# Patient Record
Sex: Female | Born: 1955 | Race: White | Hispanic: No | Marital: Married | Smoking: Never smoker
Health system: Southern US, Community
[De-identification: ages and names within clinical notes are randomized; demographics above are authoritative.]

## PROBLEM LIST (undated history)

## (undated) DIAGNOSIS — F32A Depression, unspecified: Secondary | ICD-10-CM

## (undated) DIAGNOSIS — T7840XA Allergy, unspecified, initial encounter: Secondary | ICD-10-CM

## (undated) DIAGNOSIS — J45909 Unspecified asthma, uncomplicated: Secondary | ICD-10-CM

## (undated) DIAGNOSIS — K219 Gastro-esophageal reflux disease without esophagitis: Secondary | ICD-10-CM

## (undated) DIAGNOSIS — H269 Unspecified cataract: Secondary | ICD-10-CM

## (undated) DIAGNOSIS — I219 Acute myocardial infarction, unspecified: Secondary | ICD-10-CM

## (undated) DIAGNOSIS — M1990 Unspecified osteoarthritis, unspecified site: Secondary | ICD-10-CM

## (undated) HISTORY — DX: Unspecified asthma, uncomplicated: J45.909

## (undated) HISTORY — DX: Acute myocardial infarction, unspecified: I21.9

## (undated) HISTORY — DX: Depression, unspecified: F32.A

## (undated) HISTORY — DX: Allergy, unspecified, initial encounter: T78.40XA

## (undated) HISTORY — DX: Unspecified osteoarthritis, unspecified site: M19.90

## (undated) HISTORY — PX: FINGER SURGERY: SHX640

## (undated) HISTORY — PX: EYE SURGERY: SHX253

## (undated) HISTORY — DX: Unspecified cataract: H26.9

## (undated) HISTORY — DX: Gastro-esophageal reflux disease without esophagitis: K21.9

---

## 1990-03-14 HISTORY — PX: HERNIA REPAIR: SHX51

## 1998-11-24 ENCOUNTER — Other Ambulatory Visit: Admission: RE | Admit: 1998-11-24 | Discharge: 1998-11-24 | Payer: Self-pay | Admitting: Obstetrics & Gynecology

## 2000-03-14 DIAGNOSIS — J4520 Mild intermittent asthma, uncomplicated: Secondary | ICD-10-CM | POA: Insufficient documentation

## 2016-05-11 DIAGNOSIS — F419 Anxiety disorder, unspecified: Secondary | ICD-10-CM

## 2016-05-11 HISTORY — DX: Anxiety disorder, unspecified: F41.9

## 2017-02-15 DIAGNOSIS — H903 Sensorineural hearing loss, bilateral: Secondary | ICD-10-CM | POA: Insufficient documentation

## 2018-11-05 DIAGNOSIS — M20012 Mallet finger of left finger(s): Secondary | ICD-10-CM | POA: Insufficient documentation

## 2019-01-28 ENCOUNTER — Other Ambulatory Visit: Payer: Self-pay

## 2019-01-28 DIAGNOSIS — Z20822 Contact with and (suspected) exposure to covid-19: Secondary | ICD-10-CM

## 2019-01-29 LAB — NOVEL CORONAVIRUS, NAA: SARS-CoV-2, NAA: NOT DETECTED

## 2019-01-30 ENCOUNTER — Telehealth: Payer: Self-pay | Admitting: General Practice

## 2019-01-30 NOTE — Telephone Encounter (Signed)
Gave patient negative covid test results Patient understood 

## 2020-02-19 DIAGNOSIS — Z20822 Contact with and (suspected) exposure to covid-19: Secondary | ICD-10-CM | POA: Diagnosis not present

## 2020-03-02 DIAGNOSIS — Z20822 Contact with and (suspected) exposure to covid-19: Secondary | ICD-10-CM | POA: Diagnosis not present

## 2020-03-05 ENCOUNTER — Telehealth: Payer: Self-pay

## 2020-03-05 NOTE — Telephone Encounter (Signed)
Pt calling to inquire about changing provider.  Pt wanting to switch to [place].  Pt currently with Monique Haws, Monique Rangel.  [facility].  Please advise.  CB#[phone]

## 2020-04-03 ENCOUNTER — Encounter: Payer: Self-pay | Admitting: Nurse Practitioner

## 2020-04-03 ENCOUNTER — Other Ambulatory Visit: Payer: Self-pay | Admitting: Nurse Practitioner

## 2020-04-06 ENCOUNTER — Encounter: Payer: Self-pay | Admitting: Nurse Practitioner

## 2020-04-06 DIAGNOSIS — H524 Presbyopia: Secondary | ICD-10-CM | POA: Diagnosis not present

## 2020-04-06 DIAGNOSIS — Z961 Presence of intraocular lens: Secondary | ICD-10-CM | POA: Diagnosis not present

## 2020-04-06 DIAGNOSIS — H18593 Other hereditary corneal dystrophies, bilateral: Secondary | ICD-10-CM | POA: Diagnosis not present

## 2020-04-06 DIAGNOSIS — H35363 Drusen (degenerative) of macula, bilateral: Secondary | ICD-10-CM | POA: Diagnosis not present

## 2020-04-06 DIAGNOSIS — H04123 Dry eye syndrome of bilateral lacrimal glands: Secondary | ICD-10-CM | POA: Diagnosis not present

## 2020-04-14 ENCOUNTER — Ambulatory Visit (INDEPENDENT_AMBULATORY_CARE_PROVIDER_SITE_OTHER): Payer: PPO | Admitting: Nurse Practitioner

## 2020-04-14 ENCOUNTER — Other Ambulatory Visit: Payer: Self-pay

## 2020-04-14 ENCOUNTER — Encounter: Payer: Self-pay | Admitting: Nurse Practitioner

## 2020-04-14 VITALS — BP 128/80 | HR 76 | Temp 97.4°F | Ht 66.25 in | Wt 178.6 lb

## 2020-04-14 DIAGNOSIS — Z23 Encounter for immunization: Secondary | ICD-10-CM | POA: Diagnosis not present

## 2020-04-14 DIAGNOSIS — Z136 Encounter for screening for cardiovascular disorders: Secondary | ICD-10-CM

## 2020-04-14 DIAGNOSIS — J4520 Mild intermittent asthma, uncomplicated: Secondary | ICD-10-CM

## 2020-04-14 DIAGNOSIS — Z0001 Encounter for general adult medical examination with abnormal findings: Secondary | ICD-10-CM | POA: Diagnosis not present

## 2020-04-14 DIAGNOSIS — G43909 Migraine, unspecified, not intractable, without status migrainosus: Secondary | ICD-10-CM | POA: Insufficient documentation

## 2020-04-14 DIAGNOSIS — Z780 Asymptomatic menopausal state: Secondary | ICD-10-CM | POA: Diagnosis not present

## 2020-04-14 DIAGNOSIS — Z13220 Encounter for screening for lipoid disorders: Secondary | ICD-10-CM | POA: Diagnosis not present

## 2020-04-14 DIAGNOSIS — Z1231 Encounter for screening mammogram for malignant neoplasm of breast: Secondary | ICD-10-CM

## 2020-04-14 DIAGNOSIS — H903 Sensorineural hearing loss, bilateral: Secondary | ICD-10-CM | POA: Diagnosis not present

## 2020-04-14 DIAGNOSIS — Z0000 Encounter for general adult medical examination without abnormal findings: Secondary | ICD-10-CM

## 2020-04-14 DIAGNOSIS — F411 Generalized anxiety disorder: Secondary | ICD-10-CM | POA: Diagnosis not present

## 2020-04-14 DIAGNOSIS — M5030 Other cervical disc degeneration, unspecified cervical region: Secondary | ICD-10-CM | POA: Insufficient documentation

## 2020-04-14 DIAGNOSIS — H3530 Unspecified macular degeneration: Secondary | ICD-10-CM | POA: Insufficient documentation

## 2020-04-14 LAB — CBC WITH DIFFERENTIAL/PLATELET
Basophils Absolute: 0 10*3/uL (ref 0.0–0.1)
Basophils Relative: 0.6 % (ref 0.0–3.0)
Eosinophils Absolute: 0.2 10*3/uL (ref 0.0–0.7)
Eosinophils Relative: 2.7 % (ref 0.0–5.0)
HCT: 40.9 % (ref 36.0–46.0)
Hemoglobin: 13.7 g/dL (ref 12.0–15.0)
Lymphocytes Relative: 45.3 % (ref 12.0–46.0)
Lymphs Abs: 2.7 10*3/uL (ref 0.7–4.0)
MCHC: 33.5 g/dL (ref 30.0–36.0)
MCV: 88.6 fl (ref 78.0–100.0)
Monocytes Absolute: 0.5 10*3/uL (ref 0.1–1.0)
Monocytes Relative: 8.1 % (ref 3.0–12.0)
Neutro Abs: 2.6 10*3/uL (ref 1.4–7.7)
Neutrophils Relative %: 43.3 % (ref 43.0–77.0)
Platelets: 239 10*3/uL (ref 150.0–400.0)
RBC: 4.61 Mil/uL (ref 3.87–5.11)
RDW: 14 % (ref 11.5–15.5)
WBC: 5.9 10*3/uL (ref 4.0–10.5)

## 2020-04-14 LAB — COMPREHENSIVE METABOLIC PANEL
ALT: 12 U/L (ref 0–35)
AST: 15 U/L (ref 0–37)
Albumin: 4.3 g/dL (ref 3.5–5.2)
Alkaline Phosphatase: 67 U/L (ref 39–117)
BUN: 17 mg/dL (ref 6–23)
CO2: 29 mEq/L (ref 19–32)
Calcium: 9.3 mg/dL (ref 8.4–10.5)
Chloride: 104 mEq/L (ref 96–112)
Creatinine, Ser: 0.69 mg/dL (ref 0.40–1.20)
GFR: 91.27 mL/min (ref >60.00)
Glucose, Bld: 106 mg/dL — ABNORMAL HIGH (ref 70–99)
Potassium: 4.3 mEq/L (ref 3.5–5.1)
Sodium: 139 mEq/L (ref 135–145)
Total Bilirubin: 0.9 mg/dL (ref 0.2–1.2)
Total Protein: 6.5 g/dL (ref 6.0–8.3)

## 2020-04-14 LAB — TSH: TSH: 1.69 u[IU]/mL (ref 0.35–4.50)

## 2020-04-14 LAB — LIPID PANEL
Cholesterol: 227 mg/dL — ABNORMAL HIGH (ref 0–200)
HDL: 76.6 mg/dL (ref >39.00)
LDL Cholesterol: 125 mg/dL — ABNORMAL HIGH (ref 0–99)
NonHDL: 150.78
Total CHOL/HDL Ratio: 3
Triglycerides: 127 mg/dL (ref 0.0–149.0)
VLDL: 25.4 mg/dL (ref 0.0–40.0)

## 2020-04-14 MED ORDER — DULOXETINE HCL 20 MG PO CPEP: 20.0000 mg | ORAL_CAPSULE | Freq: Every day | ORAL | 5 refills | Status: DC

## 2020-04-14 MED ORDER — ALBUTEROL SULFATE HFA 108 (90 BASE) MCG/ACT IN AERS: 1.0000 | INHALATION_SPRAY | Freq: Four times a day (QID) | RESPIRATORY_TRACT | 0 refills | Status: DC | PRN

## 2020-04-14 MED ORDER — SHINGRIX 50 MCG/0.5ML IM SUSR: 0.5000 mL | Freq: Once | INTRAMUSCULAR | 0 refills | Status: AC

## 2020-04-14 NOTE — Assessment & Plan Note (Signed)
Chronic, waxing and waning, worse in last 66months due to living with mother and winter season. Previous use of prozac (caused worsening mood) and wellbutrin (no adverse side effect). Wants to try something different. Agreed to psychology referral.  Start cymbalta Entered referral to psychology F/up in 79month

## 2020-04-14 NOTE — Assessment & Plan Note (Signed)
Managed by [facility] eye care Also uses Preservision eye solution

## 2020-04-14 NOTE — Progress Notes (Signed)
Subjective:    Patient ID: Monique Rangel, female    DOB: Feb 26, 1956, 65 y.o.   MRN: [id number]  Patient presents today for CPE and eval of chronic conditions  HPI Mild intermittent asthma without complication Controlled Triggers: environmental allergen and exercise Current use of albuterol, flonase and OTC antihistamine  albuterol refill sent  GAD (generalized anxiety disorder) Chronic, waxing and waning, worse in last 52months due to living with mother and winter season. Previous use of prozac (caused worsening mood) and wellbutrin (no adverse side effect). Wants to try something different. Agreed to psychology referral.  Start cymbalta Entered referral to psychology F/up in 28month   Sensorineural hearing loss (SNHL) of both ears Chronic, worsening per patient Entered referral to audiology  Macular degeneration, bilateral Managed by [facility] Also uses Preservision eye solution  Sexual History (orientation,birth control, marital status, STD):no need for pelvic exam, needs mammogram and bone density.  Depression/Suicide: Depression screen PHQ 2/9 04/14/2020  Decreased Interest 1  Down, Depressed, Hopeless 1  PHQ - 2 Score 2  Altered sleeping 3  Tired, decreased energy 0  Change in appetite 0  Feeling bad or failure about yourself  2  Trouble concentrating 0  Moving slowly or fidgety/restless 0  Suicidal thoughts 0  PHQ-9 Score 7  Difficult doing work/chores Not difficult at all   GAD 7 : Generalized Anxiety Score 04/14/2020  Nervous, Anxious, on Edge 2  Control/stop worrying 1  Worry too much - different things 2  Trouble relaxing 1  Restless 0  Easily annoyed or irritable 1  Afraid - awful might happen 1  Total GAD 7 Score 8  Anxiety Difficulty Not difficult at all   Vision:up to date  Dental:up to date  Immunizations: (TDAP, Hep C screen, Pneumovax, Influenza, zoster)  Health Maintenance  Topic Date Due  . Tetanus Vaccine  Never done  .  Mammogram  Never done  . Pap Smear  07/30/2019  . COVID-19 Vaccine (3 - Booster for Pfizer series) 12/11/2019  . DEXA scan (bone density measurement)  Never done  . Pneumonia vaccines (1 of 2 - PCV13) Never done  . HIV Screening  04/14/2021*  . Colon Cancer Screening  10/18/2026  . Flu Shot  Completed  .  Hepatitis C: One time screening is recommended by Center for Disease Control  (CDC) for  adults born from [birth year] through 1966.   Completed  *Topic was postponed. The date shown is not the original due date.   Diet:regular. Exercise: none Weight:  Wt Readings from Last 3 Encounters:  04/14/20 178 lb 9.6 oz (81 kg)   Fall Risk: Fall Risk  04/14/2020  Falls in the past year? 0  Number falls in past yr: 0  Injury with Fall? 0  Risk for fall due to : No Fall Risks  Follow up Falls evaluation completed   Medications and allergies reviewed with patient and updated if appropriate.  Patient Active Problem List   Diagnosis Date Noted  . GAD (generalized anxiety disorder) 04/14/2020  . Asymptomatic postmenopausal estrogen deficiency 04/14/2020  . DDD (degenerative disc disease), cervical 04/14/2020  . Migraine 04/14/2020  . Macular degeneration, bilateral 04/14/2020  . 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 Sensorineural hearing loss (SNHL) of both ears 02/15/2017  . Mild intermittent asthma without complication [id number]    Current Outpatient Medications on File Prior to Visit  Medication Sig Dispense Refill  . cyclobenzaprine (FLEXERIL) 5 MG tablet Take 5 mg  by mouth 3 (three) times daily as needed for muscle spasms.    . diphenhydrAMINE (BENADRYL) 25 MG tablet Take 25 mg by mouth at bedtime as needed.    . fluticasone (FLONASE) 50 MCG/ACT nasal spray Place 2 sprays into both nostrils every morning.    Marland Kitchen glucosamine-chondroitin 500-400 MG tablet Take 1 tablet by mouth 3 (three) times daily.     No current facility-administered medications on file prior to  visit.   Past Medical History:  Diagnosis Date  . Anxiety 05/11/2016   Social History   Socioeconomic History  . Marital status: Married    Spouse name: Not on file  . Number of children: Not on file  . Years of education: Not on file  . Highest education level: Not on file  Occupational History  . Not on file  Tobacco Use  . Smoking status: Not on file  . Smokeless tobacco: Not on file  Substance and Sexual Activity  . Alcohol use: Yes    Comment: Moderate  . Drug use: Never  . Sexual activity: Not Currently  Other Topics Concern  . Not on file  Social History Narrative  . Not on file   Social Determinants of Health   Financial Resource Strain: Not on file  Food Insecurity: Not on file  Transportation Needs: Not on file  Physical Activity: Not on file  Stress: Not on file  Social Connections: Not on file    No family history on file.      Review of Systems  Constitutional: Negative for fever, malaise/fatigue and weight loss.  HENT: Negative for congestion and sore throat.   Eyes:       Negative for visual changes  Respiratory: Negative for cough and shortness of breath.   Cardiovascular: Negative for chest pain, palpitations and leg swelling.  Gastrointestinal: Negative for blood in stool, constipation, diarrhea and heartburn.  Genitourinary: Negative for dysuria, frequency and urgency.  Musculoskeletal: Negative for falls, joint pain and myalgias.  Skin: Negative for rash.  Neurological: Negative for dizziness, sensory change and headaches.  Endo/Heme/Allergies: Does not bruise/bleed easily.  Psychiatric/Behavioral: Positive for depression. Negative for hallucinations, memory loss, substance abuse and suicidal ideas. The patient is nervous/anxious and has insomnia.    Objective:   Vitals:   04/14/20 0925  BP: 128/80  Pulse: 76  Temp: (!) 97.4 F (36.3 C)  SpO2: 98%    Body mass index is 28.61 kg/m.   Physical Examination:  Physical  Exam Vitals reviewed.  Constitutional:      General: She is not in acute distress. HENT:     Right Ear: Tympanic membrane, ear canal and external ear normal.     Left Ear: Ear canal and external ear normal.     Mouth/Throat:     Mouth: Oropharynx is clear and moist.  Eyes:     General: No scleral icterus.    Extraocular Movements: Extraocular movements intact and EOM normal.     Conjunctiva/sclera: Conjunctivae normal.  Neck:     Thyroid: No thyromegaly.  Cardiovascular:     Rate and Rhythm: Normal rate and regular rhythm.     Pulses: Normal pulses and intact distal pulses.     Heart sounds: Normal heart sounds.  Pulmonary:     Effort: Pulmonary effort is normal.     Breath sounds: Normal breath sounds.  Chest:     Chest wall: No tenderness.  Abdominal:     General: Bowel sounds are normal. There is no  distension.     Palpations: Abdomen is soft.     Tenderness: There is no abdominal tenderness.  Genitourinary:    Comments: Declined breast and pelvic exam Musculoskeletal:        General: No tenderness or edema. Normal range of motion.     Cervical back: Normal range of motion and neck supple.  Lymphadenopathy:     Cervical: No cervical adenopathy.  Skin:    General: Skin is warm and dry.  Neurological:     Mental Status: She is alert and oriented to person, place, and time.  Psychiatric:        Judgment: Judgment normal.    ASSESSMENT and PLAN: This visit occurred during the SARS-CoV-2 public health emergency.  Safety protocols were in place, including screening questions prior to the visit, additional usage of staff PPE, and extensive cleaning of exam room while observing appropriate contact time as indicated for disinfecting solutions.   Monique Rangel was seen today for establish care.  Diagnoses and all orders for this visit:  Well woman exam without gynecological exam -     Zoster Vaccine Adjuvanted [facility]) injection; Inject 0.5 mLs into the muscle once for 1 dose. -      CBC with Differential/Platelet -     Comprehensive metabolic panel -     Lipid panel -     DG Bone Density; Future -     MM DIGITAL SCREENING BILATERAL; Future  Encounter for lipid screening for cardiovascular disease -     Lipid panel  Breast cancer screening by mammogram -     MM DIGITAL SCREENING BILATERAL; Future  Sensorineural hearing loss (SNHL) of both ears -     Ambulatory referral to Audiology  GAD (generalized anxiety disorder) -     TSH -     DULoxetine (CYMBALTA) 20 MG capsule; Take 1 capsule (20 mg total) by mouth daily. -     Ambulatory referral to Psychology  Need for shingles vaccine -     Zoster Vaccine Adjuvanted [facility]) injection; Inject 0.5 mLs into the muscle once for 1 dose.  Asymptomatic postmenopausal estrogen deficiency -     Vitamin D 1,25 dihydroxy -     DG Bone Density; Future  Mild intermittent asthma without complication -     albuterol (VENTOLIN HFA) 108 (90 Base) MCG/ACT inhaler; Inhale 1-2 puffs into the lungs every 6 (six) hours as needed for wheezing or shortness of breath.      Problem List Items Addressed This Visit      Respiratory   Mild intermittent asthma without complication    Controlled Triggers: environmental allergen and exercise Current use of albuterol, flonase and OTC antihistamine  albuterol refill sent      Relevant Medications   albuterol (VENTOLIN HFA) 108 (90 Base) MCG/ACT inhaler     Nervous and Auditory   Sensorineural hearing loss (SNHL) of both ears    Chronic, worsening per patient Entered referral to audiology      Relevant Orders   Ambulatory referral to Audiology     Other   Asymptomatic postmenopausal estrogen deficiency   Relevant Orders   Vitamin D 1,25 dihydroxy   DG Bone Density   GAD (generalized anxiety disorder)    Chronic, waxing and waning, worse in last 79months due to living with mother and winter season. Previous use of prozac (caused worsening mood) and wellbutrin (no adverse  side effect). Wants to try something different. Agreed to psychology referral.  Start cymbalta Entered referral  to psychology F/up in 60month       Relevant Medications   DULoxetine (CYMBALTA) 20 MG capsule   Other Relevant Orders   TSH   Ambulatory referral to Psychology    Other Visit Diagnoses    Well woman exam without gynecological exam    -  Primary   Relevant Medications   Zoster Vaccine Adjuvanted [facility]) injection   Other Relevant Orders   CBC with Differential/Platelet   Comprehensive metabolic panel   Lipid panel   DG Bone Density   MM DIGITAL SCREENING BILATERAL   Encounter for lipid screening for cardiovascular disease       Relevant Orders   Lipid panel   Breast cancer screening by mammogram       Relevant Orders   MM DIGITAL SCREENING BILATERAL   Need for shingles vaccine       Relevant Medications   Zoster Vaccine Adjuvanted [facility]) injection      Follow up: Return in about 4 weeks (around 05/12/2020) for anxiety (95mins).  Wilfred Lacy, NP

## 2020-04-14 NOTE — Patient Instructions (Addendum)
Thank you for choosing [facility].  Go to lab for blood draw You will be contacted to schedule appt for bone density, mammogram, with psychologist and audiology.  Start cymbalta for anxiety.  Preventive Care 66 Years and Older, Female Preventive care refers to lifestyle choices and visits with your health care provider that can promote health and wellness. This includes:  A yearly physical exam. This is also called an annual wellness visit.  Regular dental and eye exams.  Immunizations.  Screening for certain conditions.  Healthy lifestyle choices, such as: ? Eating a healthy diet. ? Getting regular exercise. ? Not using drugs or products that contain nicotine and tobacco. ? Limiting alcohol use. What can I expect for my preventive care visit? Physical exam Your health care provider will check your:  Height and weight. These may be used to calculate your BMI (body mass index). BMI is a measurement that tells if you are at a healthy weight.  Heart rate and blood pressure.  Body temperature.  Skin for abnormal spots. Counseling Your health care provider may ask you questions about your:  Past medical problems.  Family's medical history.  Alcohol, tobacco, and drug use.  Emotional well-being.  Home life and relationship well-being.  Sexual activity.  Diet, exercise, and sleep habits.  History of falls.  Memory and ability to understand (cognition).  Work and work Statistician.  Pregnancy and menstrual history.  Access to firearms. What immunizations do I need? Vaccines are usually given at various ages, according to a schedule. Your health care provider will recommend vaccines for you based on your age, medical history, and lifestyle or other factors, such as travel or where you work.   What tests do I need? Blood tests  Lipid and cholesterol levels. These may be checked every 5 years, or more often depending on your overall health.  Hepatitis  C test.  Hepatitis B test. Screening  Lung cancer screening. You may have this screening every year starting at age 66 if you have a 30-pack-year history of smoking and currently smoke or have quit within the past 15 years.  Colorectal cancer screening. ? All adults should have this screening starting at age 66 and continuing until age 58. ? Your health care provider may recommend screening at age 65 if you are at increased risk. ? You will have tests every 1-10 years, depending on your results and the type of screening test.  Diabetes screening. ? This is done by checking your blood sugar (glucose) after you have not eaten for a while (fasting). ? You may have this done every 1-3 years.  Mammogram. ? This may be done every 1-2 years. ? Talk with your health care provider about how often you should have regular mammograms.  Abdominal aortic aneurysm (AAA) screening. You may need this if you are a current or former smoker.  BRCA-related cancer screening. This may be done if you have a family history of breast, ovarian, tubal, or peritoneal cancers. Other tests  STD (sexually transmitted disease) testing, if you are at risk.  Bone density scan. This is done to screen for osteoporosis. You may have this done starting at age 66. Talk with your health care provider about your test results, treatment options, and if necessary, the need for more tests. Follow these instructions at home: Eating and drinking  Eat a diet that includes fresh fruits and vegetables, whole grains, lean protein, and low-fat dairy products. Limit your intake of foods with high amounts  of sugar, saturated fats, and salt.  Take vitamin and mineral supplements as recommended by your health care provider.  Do not drink alcohol if your health care provider tells you not to drink.  If you drink alcohol: ? Limit how much you have to 0-1 drink a day. ? Be aware of how much alcohol is in your drink. In the [place] one  drink equals one 12 oz bottle of beer (355 mL), one 5 oz glass of wine (148 mL), or one 1 oz glass of hard liquor (44 mL).   Lifestyle  Take daily care of your teeth and gums. Brush your teeth every morning and night with fluoride toothpaste. Floss one time each day.  Stay active. Exercise for at least 30 minutes 5 or more days each week.  Do not use any products that contain nicotine or tobacco, such as cigarettes, e-cigarettes, and chewing tobacco. If you need help quitting, ask your health care provider.  Do not use drugs.  If you are sexually active, practice safe sex. Use a condom or other form of protection in order to prevent STIs (sexually transmitted infections).  Talk with your health care provider about taking a low-dose aspirin or statin.  Find healthy ways to cope with stress, such as: ? Meditation, yoga, or listening to music. ? Journaling. ? Talking to a trusted person. ? Spending time with friends and family. Safety  Always wear your seat belt while driving or riding in a vehicle.  Do not drive: ? If you have been drinking alcohol. Do not ride with someone who has been drinking. ? When you are tired or distracted. ? While texting.  Wear a helmet and other protective equipment during sports activities.  If you have firearms in your house, make sure you follow all gun safety procedures. What's next?  Visit your health care provider once a year for an annual wellness visit.  Ask your health care provider how often you should have your eyes and teeth checked.  Stay up to date on all vaccines. This information is not intended to replace advice given to you by your health care provider. Make sure you discuss any questions you have with your health care provider. Document Revised: 02/19/2020 Document Reviewed: 02/22/2018 Elsevier Patient Education  2021 [employer].

## 2020-04-14 NOTE — Assessment & Plan Note (Signed)
Chronic, worsening per patient Entered referral to audiology

## 2020-04-14 NOTE — Assessment & Plan Note (Signed)
Controlled Triggers: environmental allergen and exercise Current use of albuterol, flonase and OTC antihistamine  albuterol refill sent

## 2020-04-17 LAB — VITAMIN D 1,25 DIHYDROXY
Vitamin D 1, 25 (OH)2 Total: 55 pg/mL (ref 18–72)
Vitamin D2 1, 25 (OH)2: <8 pg/mL
Vitamin D3 1, 25 (OH)2: 55 pg/mL

## 2020-05-04 ENCOUNTER — Ambulatory Visit (INDEPENDENT_AMBULATORY_CARE_PROVIDER_SITE_OTHER): Payer: PPO | Admitting: Psychology

## 2020-05-04 DIAGNOSIS — F411 Generalized anxiety disorder: Secondary | ICD-10-CM | POA: Diagnosis not present

## 2020-05-11 ENCOUNTER — Other Ambulatory Visit: Payer: Self-pay

## 2020-05-12 ENCOUNTER — Ambulatory Visit: Payer: PPO

## 2020-05-12 ENCOUNTER — Ambulatory Visit (INDEPENDENT_AMBULATORY_CARE_PROVIDER_SITE_OTHER): Payer: PPO | Admitting: Nurse Practitioner

## 2020-05-12 ENCOUNTER — Encounter: Payer: Self-pay | Admitting: Nurse Practitioner

## 2020-05-12 VITALS — BP 122/80 | HR 74 | Temp 98.0°F | Ht 66.0 in | Wt 177.6 lb

## 2020-05-12 DIAGNOSIS — M25561 Pain in right knee: Secondary | ICD-10-CM

## 2020-05-12 DIAGNOSIS — M5030 Other cervical disc degeneration, unspecified cervical region: Secondary | ICD-10-CM

## 2020-05-12 DIAGNOSIS — M25562 Pain in left knee: Secondary | ICD-10-CM

## 2020-05-12 DIAGNOSIS — B351 Tinea unguium: Secondary | ICD-10-CM | POA: Diagnosis not present

## 2020-05-12 DIAGNOSIS — G8929 Other chronic pain: Secondary | ICD-10-CM

## 2020-05-12 MED ORDER — TERBINAFINE HCL 250 MG PO TABS
250.0000 mg | ORAL_TABLET | Freq: Every day | ORAL | 0 refills | Status: DC
Start: 2020-05-12 — End: 2020-05-19

## 2020-05-12 NOTE — Patient Instructions (Signed)
Return to lab for repeat hepatic panel in 31month.  Schedule lab appt. Soak feet in water and vinegar mixture daily, 51mins each time  Go to lab for knee x-rays You will be contacted to schedule appt with ortho

## 2020-05-12 NOTE — Progress Notes (Signed)
Subjective:  Patient ID: Monique Rangel, female    DOB: 03-01-1956  Age: 65 y.o. MRN: [id number]  CC: Follow-up (4 week f/u on anxiety and neck spasms. Pt states she feels like the Cymbalta is helping a lot. Pt states she has had the neck spasms for years and has seen a doctor in the past but still no improvement. )  HPI  Onychomycosis bilateral toenails: thick, brittle, yellow. no sign of cellulitis. Start lamisil Return to lab in 55month for repeat hepatic panel Advised to avoid ETOH and acetaminophen. She is stop medication and call office if she develops ABD pain, nausea or jaundice.  Chronic pain of both knees Chronic, worsening in last 51months with playing tennis and climbing stairs. Associated with swelling and stiffness. Denies previous surgery of injury. Reports Hx of OA. Intra articular corticosteriod injection administered x 2 65yrs ago with no improvement. She is not interested in any surgery at this time. Some improvement with voltaren gel and knee braces Obtain knee x-ray Entered referral to ortho to discuss viscosupplementation treatment.  DDD (degenerative disc disease), cervical Chronic, right side intermittent neck pain, ongoing since 2016 Limited ROM to left No weakness, no paresthesia. Some improvement with outpatient PT. Reviewed records via care everywhere:  cervical spine x-ray report 2016: Diffuse degenerative changes cervical spine degenerative changes most prominent at C5-C6. Mild right C4-C5 neural foraminal narrowing secondary degenerative change present. Mild loss of normal cervical lordosis. This could be secondary to positioning, torticollis, degenerative change, ligamentous injury. No evidence of fracture or dislocation.   Referral to ortho    Reviewed past Medical, Social and Family history today.  Outpatient Medications Prior to Visit  Medication Sig Dispense Refill  . albuterol (VENTOLIN HFA) 108 (90 Base) MCG/ACT inhaler Inhale 1-2 puffs  into the lungs every 6 (six) hours as needed for wheezing or shortness of breath. 8 g 0  . cyclobenzaprine (FLEXERIL) 5 MG tablet Take 5 mg by mouth 3 (three) times daily as needed for muscle spasms.    . diphenhydrAMINE (BENADRYL) 25 MG tablet Take 25 mg by mouth at bedtime as needed.    . DULoxetine (CYMBALTA) 20 MG capsule Take 1 capsule (20 mg total) by mouth daily. 30 capsule 5  . fluticasone (FLONASE) 50 MCG/ACT nasal spray Place 2 sprays into both nostrils every morning.    Marland Kitchen glucosamine-chondroitin 500-400 MG tablet Take 1 tablet by mouth 3 (three) times daily.     No facility-administered medications prior to visit.    ROS See HPI  Objective:  BP 122/80 (BP Location: Left Arm, Patient Position: Sitting, Cuff Size: Large)   Pulse 74   Temp 98 F (36.7 C) (Temporal)   Ht 5\' 6"  (1.676 m)   Wt 177 lb 9.6 oz (80.6 kg)   SpO2 98%   BMI 28.67 kg/m   Physical Exam Cardiovascular:     Pulses: Normal pulses.  Pulmonary:     Effort: Pulmonary effort is normal.  Musculoskeletal:     Right shoulder: Normal.     Left shoulder: Normal.     Right upper arm: Normal.     Left upper arm: Normal.     Right elbow: Normal.     Left elbow: Normal.     Right forearm: Normal.     Left forearm: Normal.     Right wrist: Normal.     Left wrist: Normal.     Right hand: Normal.     Left hand: Normal.  Cervical back: Tenderness present. No torticollis or crepitus. Pain with movement present. Decreased range of motion.     Thoracic back: Normal.     Right knee: Effusion present. No erythema. Normal range of motion. Tenderness present over the lateral joint line. No medial joint line or patellar tendon tenderness.     Left knee: Effusion present. No erythema. Normal range of motion. No tenderness.     Right lower leg: Normal. No edema.     Left lower leg: Normal. No edema.  Feet:     Right foot:     Skin integrity: No erythema.     Toenail Condition: Right toenails are abnormally thick.  Fungal disease present.    Left foot:     Skin integrity: No erythema.     Toenail Condition: Left toenails are abnormally thick. Fungal disease present. Neurological:     Mental Status: She is oriented to person, place, and time.    Assessment & Plan:  This visit occurred during the SARS-CoV-2 public health emergency.  Safety protocols were in place, including screening questions prior to the visit, additional usage of staff PPE, and extensive cleaning of exam room while observing appropriate contact time as indicated for disinfecting solutions.   [name] was seen today for follow-up.  Diagnoses and all orders for this visit:  Onychomycosis -     terbinafine (LAMISIL) 250 MG tablet; Take 1 tablet (250 mg total) by mouth daily. Needs repeat hepatic panel for additional refills -     Hepatic function panel; Future -     Hepatic function panel -     DG Knee Complete 4 Views Left; Future -     DG Knee Complete 4 Views Right; Future  DDD (degenerative disc disease), cervical -     Ambulatory referral to Orthopedic Surgery -     DG Knee Complete 4 Views Left; Future -     DG Knee Complete 4 Views Right; Future  Chronic pain of both knees -     Ambulatory referral to Orthopedic Surgery -     Cancel: DG Knee 4 Views W/Patella Right -     Cancel: DG Knee 4 Views W/Patella Left -     DG Knee Complete 4 Views Left; Future -     DG Knee Complete 4 Views Right; Future    Problem List Items Addressed This Visit      Musculoskeletal and Integument   DDD (degenerative disc disease), cervical    Chronic, right side intermittent neck pain, ongoing since 2016 Limited ROM to left No weakness, no paresthesia. Some improvement with outpatient PT. Reviewed records via care everywhere:  cervical spine x-ray report 2016: Diffuse degenerative changes cervical spine degenerative changes most prominent at C5-C6. Mild right C4-C5 neural foraminal narrowing secondary degenerative change present. Mild  loss of normal cervical lordosis. This could be secondary to positioning, torticollis, degenerative change, ligamentous injury. No evidence of fracture or dislocation.   Referral to ortho        Relevant Orders   Ambulatory referral to Orthopedic Surgery   DG Knee Complete 4 Views Left   DG Knee Complete 4 Views Right   Onychomycosis - Primary    bilateral toenails: thick, brittle, yellow. no sign of cellulitis. Start lamisil Return to lab in 23month for repeat hepatic panel Advised to avoid ETOH and acetaminophen. She is stop medication and call office if she develops ABD pain, nausea or jaundice.      Relevant Medications  terbinafine (LAMISIL) 250 MG tablet   Other Relevant Orders   Hepatic function panel   DG Knee Complete 4 Views Left   DG Knee Complete 4 Views Right     Other   Chronic pain of both knees    Chronic, worsening in last 36months with playing tennis and climbing stairs. Associated with swelling and stiffness. Denies previous surgery of injury. Reports Hx of OA. Intra articular corticosteriod injection administered x 2 38yrs ago with no improvement. She is not interested in any surgery at this time. Some improvement with voltaren gel and knee braces Obtain knee x-ray Entered referral to ortho to discuss viscosupplementation treatment.      Relevant Orders   Ambulatory referral to Orthopedic Surgery   DG Knee Complete 4 Views Left   DG Knee Complete 4 Views Right      Follow-up: Return if symptoms worsen or fail to improve.  Wilfred Lacy, NP

## 2020-05-12 NOTE — Assessment & Plan Note (Addendum)
bilateral toenails: thick, brittle, yellow. no sign of cellulitis. Start lamisil Return to lab in 62month for repeat hepatic panel Advised to avoid ETOH and acetaminophen. She is stop medication and call office if she develops ABD pain, nausea or jaundice.

## 2020-05-12 NOTE — Assessment & Plan Note (Signed)
Chronic, worsening in last 7months with playing tennis and climbing stairs. Associated with swelling and stiffness. Denies previous surgery of injury. Reports Hx of OA. Intra articular corticosteriod injection administered x 2 37yrs ago with no improvement. She is not interested in any surgery at this time. Some improvement with voltaren gel and knee braces Obtain knee x-ray Entered referral to ortho to discuss viscosupplementation treatment.

## 2020-05-12 NOTE — Assessment & Plan Note (Signed)
Chronic, right side intermittent neck pain, ongoing since 2016 Limited ROM to left No weakness, no paresthesia. Some improvement with outpatient PT. Reviewed records via care everywhere:  cervical spine x-ray report 2016: Diffuse degenerative changes cervical spine degenerative changes most prominent at C5-C6. Mild right C4-C5 neural foraminal narrowing secondary degenerative change present. Mild loss of normal cervical lordosis. This could be secondary to positioning, torticollis, degenerative change, ligamentous injury. No evidence of fracture or dislocation.   Referral to ortho

## 2020-05-13 ENCOUNTER — Other Ambulatory Visit: Payer: Self-pay

## 2020-05-13 ENCOUNTER — Ambulatory Visit (INDEPENDENT_AMBULATORY_CARE_PROVIDER_SITE_OTHER): Payer: PPO

## 2020-05-13 DIAGNOSIS — M1712 Unilateral primary osteoarthritis, left knee: Secondary | ICD-10-CM | POA: Diagnosis not present

## 2020-05-13 DIAGNOSIS — M25562 Pain in left knee: Secondary | ICD-10-CM

## 2020-05-13 DIAGNOSIS — G8929 Other chronic pain: Secondary | ICD-10-CM

## 2020-05-13 DIAGNOSIS — M25561 Pain in right knee: Secondary | ICD-10-CM

## 2020-05-13 DIAGNOSIS — M1711 Unilateral primary osteoarthritis, right knee: Secondary | ICD-10-CM | POA: Diagnosis not present

## 2020-05-13 LAB — HEPATIC FUNCTION PANEL
ALT: 12 U/L (ref 0–35)
AST: 18 U/L (ref 0–37)
Albumin: 4.4 g/dL (ref 3.5–5.2)
Alkaline Phosphatase: 72 U/L (ref 39–117)
Bilirubin, Direct: 0.2 mg/dL (ref 0.0–0.3)
Total Bilirubin: 1 mg/dL (ref 0.2–1.2)
Total Protein: 6.8 g/dL (ref 6.0–8.3)

## 2020-05-13 NOTE — Addendum Note (Signed)
Addended by: Lynnea Ferrier on: 05/13/2020 01:23 PM   Modules accepted: Orders

## 2020-05-14 NOTE — Addendum Note (Signed)
Addended by: Wilfred Lacy L on: 05/14/2020 11:51 AM   Modules accepted: Orders

## 2020-05-18 ENCOUNTER — Ambulatory Visit (INDEPENDENT_AMBULATORY_CARE_PROVIDER_SITE_OTHER): Payer: PPO | Admitting: Psychology

## 2020-05-18 ENCOUNTER — Telehealth: Payer: Self-pay

## 2020-05-18 DIAGNOSIS — F411 Generalized anxiety disorder: Secondary | ICD-10-CM | POA: Diagnosis not present

## 2020-05-18 NOTE — Telephone Encounter (Signed)
Stop medication and start benadryl 25mg  every 12hrs.

## 2020-05-18 NOTE — Telephone Encounter (Signed)
Pt is having side effects to lamisil that was prescribed last week. She has developed a rash on her neck and arms and has a slight headache. She stopped taking the medication yesterday.She wants to know if something topical can be prescribed.  Thank you

## 2020-05-18 NOTE — Telephone Encounter (Signed)
No new medication at this time till rash resolves

## 2020-05-18 NOTE — Telephone Encounter (Signed)
Pt would like to know if a topical ointment for her toenail fungus?

## 2020-05-19 ENCOUNTER — Ambulatory Visit (INDEPENDENT_AMBULATORY_CARE_PROVIDER_SITE_OTHER): Payer: PPO | Admitting: Family Medicine

## 2020-05-19 ENCOUNTER — Other Ambulatory Visit: Payer: Self-pay

## 2020-05-19 ENCOUNTER — Ambulatory Visit (INDEPENDENT_AMBULATORY_CARE_PROVIDER_SITE_OTHER): Payer: PPO

## 2020-05-19 ENCOUNTER — Encounter: Payer: Self-pay | Admitting: Family Medicine

## 2020-05-19 ENCOUNTER — Telehealth: Payer: Self-pay | Admitting: Family Medicine

## 2020-05-19 DIAGNOSIS — M542 Cervicalgia: Secondary | ICD-10-CM

## 2020-05-19 DIAGNOSIS — G8929 Other chronic pain: Secondary | ICD-10-CM | POA: Diagnosis not present

## 2020-05-19 DIAGNOSIS — M25561 Pain in right knee: Secondary | ICD-10-CM

## 2020-05-19 DIAGNOSIS — M25562 Pain in left knee: Secondary | ICD-10-CM | POA: Diagnosis not present

## 2020-05-19 NOTE — Patient Instructions (Signed)
   Turmeric:  500 mg twice daily    [facility]:   [phone]
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
## 2020-05-19 NOTE — Telephone Encounter (Signed)
Noted  

## 2020-05-19 NOTE — Telephone Encounter (Signed)
Requesting approval for bilateral knee gel injections for OA.

## 2020-05-19 NOTE — Progress Notes (Signed)
I saw and examined the patient with Dr. Elouise Munroe and agree with assessment and plan as outlined.    Chronic intermittent neck pain with brief spasms.  X-rays today show moderate C5-6 degenerative disc disease with diffuse facet arthropathy.  We will try physical therapy, possible traction.  Home exercises for maintenance.  Bilateral knee pain with osteoarthritis.  Pain is minimal at the moment, she does well with glucosamine and Voltaren gel.  We will request approval for gel injections for future use.  Referral to physical therapy as well for strengthening.  She plans to play tennis this year and would like to do some injury prevention if possible.

## 2020-05-21 ENCOUNTER — Telehealth: Payer: Self-pay

## 2020-05-21 MED ORDER — CYCLOBENZAPRINE HCL 5 MG PO TABS: 5.0000 mg | ORAL_TABLET | Freq: Three times a day (TID) | ORAL | 1 refills | Status: DC | PRN

## 2020-05-21 NOTE — Telephone Encounter (Signed)
Please advise 

## 2020-05-21 NOTE — Addendum Note (Signed)
Addended by: Hortencia Pilar on: 05/21/2020 11:55 AM   Modules accepted: Orders

## 2020-05-21 NOTE — Telephone Encounter (Signed)
Patient called she is requesting a rx to be sent into the pharmacy for flexeril call [phone]

## 2020-05-21 NOTE — Telephone Encounter (Signed)
Sent!

## 2020-05-21 NOTE — Telephone Encounter (Signed)
Submitted VOB for Monovisc, bilateral knee.  Pending BV. 

## 2020-05-21 NOTE — Telephone Encounter (Signed)
I tried calling the patient to advise that the medication has been sent in to the pharmacy -- reached her voice mail, which has not been set up yet. The pharmacy should alert her, though, when the Rx is ready for pickup.

## 2020-05-22 NOTE — Telephone Encounter (Signed)
Patient notified and verbalized understanding. 

## 2020-05-26 ENCOUNTER — Ambulatory Visit: Payer: PPO | Admitting: Nurse Practitioner

## 2020-05-26 ENCOUNTER — Ambulatory Visit (INDEPENDENT_AMBULATORY_CARE_PROVIDER_SITE_OTHER): Payer: PPO | Admitting: Psychology

## 2020-05-26 DIAGNOSIS — F411 Generalized anxiety disorder: Secondary | ICD-10-CM | POA: Diagnosis not present

## 2020-06-03 ENCOUNTER — Ambulatory Visit
Admission: RE | Admit: 2020-06-03 | Discharge: 2020-06-03 | Disposition: A | Discharge status: Home / Self Care | Payer: PPO | Source: Ambulatory Visit | Attending: Nurse Practitioner | Admitting: Nurse Practitioner

## 2020-06-03 ENCOUNTER — Encounter: Payer: Self-pay | Admitting: Physical Therapy

## 2020-06-03 ENCOUNTER — Ambulatory Visit: Payer: PPO | Attending: Family Medicine | Admitting: Physical Therapy

## 2020-06-03 ENCOUNTER — Other Ambulatory Visit: Payer: Self-pay

## 2020-06-03 DIAGNOSIS — H903 Sensorineural hearing loss, bilateral: Secondary | ICD-10-CM | POA: Insufficient documentation

## 2020-06-03 DIAGNOSIS — Z1231 Encounter for screening mammogram for malignant neoplasm of breast: Secondary | ICD-10-CM | POA: Diagnosis not present

## 2020-06-03 DIAGNOSIS — G8929 Other chronic pain: Secondary | ICD-10-CM | POA: Insufficient documentation

## 2020-06-03 DIAGNOSIS — Z0000 Encounter for general adult medical examination without abnormal findings: Secondary | ICD-10-CM

## 2020-06-03 DIAGNOSIS — M25562 Pain in left knee: Secondary | ICD-10-CM | POA: Insufficient documentation

## 2020-06-03 DIAGNOSIS — M542 Cervicalgia: Secondary | ICD-10-CM | POA: Diagnosis not present

## 2020-06-03 DIAGNOSIS — M25561 Pain in right knee: Secondary | ICD-10-CM | POA: Diagnosis not present

## 2020-06-03 DIAGNOSIS — M62838 Other muscle spasm: Secondary | ICD-10-CM | POA: Insufficient documentation
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
## 2020-06-08 ENCOUNTER — Ambulatory Visit: Payer: PPO | Admitting: Audiologist

## 2020-06-08 ENCOUNTER — Other Ambulatory Visit: Payer: Self-pay | Admitting: Nurse Practitioner

## 2020-06-08 ENCOUNTER — Other Ambulatory Visit: Payer: Self-pay

## 2020-06-08 DIAGNOSIS — R928 Other abnormal and inconclusive findings on diagnostic imaging of breast: Secondary | ICD-10-CM

## 2020-06-08 DIAGNOSIS — M25561 Pain in right knee: Secondary | ICD-10-CM | POA: Diagnosis not present

## 2020-06-08 DIAGNOSIS — H903 Sensorineural hearing loss, bilateral: Secondary | ICD-10-CM

## 2020-06-08 NOTE — Procedures (Signed)
  Outpatient Audiology and [place], [place]  [address] [phone]  AUDIOLOGICAL  EVALUATION  NAME: Monique Rangel     DOB:   01/19/1956      MRN: [id number]                                                                                     DATE: 06/08/2020     REFERENT: Flossie Buffy, NP STATUS: Outpatient DIAGNOSIS: Sensorineural hearing loss (SNHL) of both ears    History: Monique Rangel was seen for an audiological evaluation.  Monique Rangel is receiving a hearing evaluation due to concerns for continued progression of her hearing loss. Monique Rangel has difficulty hearing in background noise, crowds, and when people are at a distance. This difficulty began gradually. No pain or pressure reported in either ear. Tinnitus present in occasionally in both ears. Monique Rangel has a history of ear infections with a severe infection at age 75. Her last ear infection was back in college. Monique Rangel has family history of hearing loss in the women in her family. Her mother wears hearing aids and her sister had surgery on her ears when she was 43. Monique Rangel said she thinks it was a cochlear implant, but says her sister does not have an external device. It is unclear what her sister had done. Monique Rangel brought two audiograms, one from 2014 and one from 2018 with Monique Rangel at [facility]. Previous audiogram show sensorineural mild to moderate reverse cookie bite hearing loss. Monique Rangel has previously been evaluated by Monique Rangel. No other relevant case history reported.    Evaluation:   Otoscopy showed a partial view of the tympanic membranes with nonoccluding cerumen, bilaterally  Tympanometry results were consistent with normal function of the left middle ear and type Ad response in the right ear  Audiometric testing was completed using conventional audiometry with pediatric insert transducer. Speech Recognition Thresholds were consistent with pure tone averages. Word Recognition was excellent at conversation and  at an elevated level. Pure tone thresholds show mild to moderate reverse cookie bite sensorineural hearing loss in both ears. Test results are consistent with previous evaluations and do not show significant change.   Results:  The test results were reviewed with Monique Rangel. Hearing loss is stable compared to previous exams. Word recognition 100% in both ears at conversational level. Monique Rangel was counseled on amplification. She was advised she may benefit from a hearing aid demo. Monique Rangel said she would like to continue monitoring her hearing loss and will pursue haring aids if the hearing loss becomes impactful on a daily basis.    Recommendations: 1. Repeat audiologic evaluation every two years to monitor hearing loss progression. Monique Rangel should return sooner if further concerns arise or hearing changes.    Alfonse Alpers  Audiologist, Au.D., CCC-A 06/08/2020  2:30 PM  Cc: Flossie Buffy, NP

## 2020-06-09 ENCOUNTER — Ambulatory Visit: Payer: PPO | Admitting: Psychology

## 2020-06-09 ENCOUNTER — Ambulatory Visit: Payer: PPO | Admitting: Physical Therapy

## 2020-06-09 DIAGNOSIS — M62838 Other muscle spasm: Secondary | ICD-10-CM

## 2020-06-09 DIAGNOSIS — M542 Cervicalgia: Secondary | ICD-10-CM

## 2020-06-09 DIAGNOSIS — G8929 Other chronic pain: Secondary | ICD-10-CM

## 2020-06-09 DIAGNOSIS — M25561 Pain in right knee: Secondary | ICD-10-CM | POA: Diagnosis not present

## 2020-06-09 DIAGNOSIS — M25562 Pain in left knee: Secondary | ICD-10-CM

## 2020-06-09 NOTE — Therapy (Signed)
[place]. [place], [place], [address] Phone: [phone]   Fax:  [phone]  Physical Therapy Treatment  Patient Details  Name: Monique Rangel MRN: [id number] Date of Birth: Dec 05, 1955 Referring Provider (PT): Hilts   Encounter Date: 06/09/2020   PT End of Session - 06/09/20 1212    Visit Number 2    PT Start Time 8916    PT Stop Time 1223    PT Time Calculation (min) 42 min           Past Medical History:  Diagnosis Date  . Anxiety 05/11/2016    No past surgical history on file.  There were no vitals filed for this visit.   Subjective Assessment - 06/09/20 1142    Subjective traction felt amazing! really helped. rode bike- staying active    Currently in Pain? No/denies                             [facility] Adult PT Treatment/Exercise - 06/09/20 0001      Exercises   Exercises Neck;Shoulder;Knee/Hip      Neck Exercises: Standing   Neck Retraction 15 reps;3 secs   head on ball on wall   Wall Push Ups 5 reps   CW     Knee/Hip Exercises: Machines for Strengthening   Cybex Knee Extension 5# 2 sets 10    Cybex Knee Flexion 20# 2 sets 10      Knee/Hip Exercises: Standing   Hip Flexion Stengthening;Both;10 reps   green TB   Hip Abduction Stengthening;Both;10 reps   green TB   Hip Extension Stengthening;Both;10 reps   Green TB     Knee/Hip Exercises: Seated   Sit to Sand 2 sets;10 reps;without UE support   wt ball 10x chest press and 10x OH press     Shoulder Exercises: Standing   Other Standing Exercises 4 way scap stab 10 each -red tband      Shoulder Exercises: ROM/Strengthening   Nustep L 5 5 min      Traction   Type of Traction Cervical    Max (lbs) 12#    Rest Time static    Time 12 min                    PT Short Term Goals - 06/03/20 1005      PT SHORT TERM GOAL #1   Title independent with iniital HEP    Time 2    Period Weeks    Status New              PT Long Term Goals - 06/03/20 1006      PT LONG TERM GOAL #1   Title understand posture and body mechanics    Time 12    Period Weeks    Status New      PT LONG TERM GOAL #2   Title decreease pain 50%    Time 12    Period Weeks    Status New      PT LONG TERM GOAL #3   Title increase cervical ROM 25%    Time 8    Period Weeks    Status New      PT LONG TERM GOAL #4   Title report 25% less knee pain with walking    Time 8    Period Weeks    Status New  Plan - 06/09/20 1212    Clinical Impression Statement pt tolerated initial ther ex progression well with cuing. good relief with traction.    PT Treatment/Interventions ADLs/Self Care Home Management;Cryotherapy;Electrical Stimulation;Moist Heat;Traction;Stair training;Functional mobility training;Therapeutic activities;Therapeutic exercise;Balance training;Neuromuscular re-education;Manual techniques;Patient/family education;Dry needling    PT Next Visit Plan asses ther ex and progress, increase HEP , assess traction           Patient will benefit from skilled therapeutic intervention in order to improve the following deficits and impairments:  Abnormal gait,Difficulty walking,Decreased range of motion,Increased muscle spasms,Decreased activity tolerance,Pain,Improper body mechanics,Postural dysfunction,Decreased strength,Decreased balance  Visit Diagnosis: Chronic pain of both knees  Cervicalgia  Other muscle spasm     Problem List Patient Active Problem List   Diagnosis Date Noted  . Chronic pain of both knees 05/12/2020  . Onychomycosis 05/12/2020  . GAD (generalized anxiety disorder) 04/14/2020  . Asymptomatic postmenopausal estrogen deficiency 04/14/2020  . DDD (degenerative disc disease), cervical 04/14/2020  . Migraine 04/14/2020  . Macular degeneration, bilateral 04/14/2020  . 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 Sensorineural hearing loss  (SNHL) of both ears 02/15/2017  . Mild intermittent asthma without complication 21/30/8657    Genell Thede,ANGIE PTA 06/09/2020, 12:14 PM  [place]. [place], [place], [address] Phone: [phone]   Fax:  [phone]  Name: Monique Rangel MRN: [id number] Date of Birth: Aug 18, 1955

## 2020-06-10 ENCOUNTER — Other Ambulatory Visit: Payer: Self-pay

## 2020-06-10 ENCOUNTER — Ambulatory Visit: Payer: PPO

## 2020-06-10 ENCOUNTER — Ambulatory Visit
Admission: RE | Admit: 2020-06-10 | Discharge: 2020-06-10 | Disposition: A | Discharge status: Home / Self Care | Payer: PPO | Source: Ambulatory Visit | Attending: Nurse Practitioner | Admitting: Nurse Practitioner

## 2020-06-10 DIAGNOSIS — N6489 Other specified disorders of breast: Secondary | ICD-10-CM | POA: Diagnosis not present

## 2020-06-10 DIAGNOSIS — R928 Other abnormal and inconclusive findings on diagnostic imaging of breast: Secondary | ICD-10-CM

## 2020-06-10 DIAGNOSIS — R922 Inconclusive mammogram: Secondary | ICD-10-CM | POA: Diagnosis not present

## 2020-06-11 ENCOUNTER — Other Ambulatory Visit: Payer: PPO

## 2020-06-15 ENCOUNTER — Other Ambulatory Visit: Payer: Self-pay

## 2020-06-15 ENCOUNTER — Ambulatory Visit: Payer: PPO | Attending: Nurse Practitioner | Admitting: Physical Therapy

## 2020-06-15 ENCOUNTER — Encounter: Payer: Self-pay | Admitting: Physical Therapy

## 2020-06-15 DIAGNOSIS — M25562 Pain in left knee: Secondary | ICD-10-CM | POA: Insufficient documentation

## 2020-06-15 DIAGNOSIS — M25561 Pain in right knee: Secondary | ICD-10-CM | POA: Diagnosis not present

## 2020-06-15 DIAGNOSIS — G8929 Other chronic pain: Secondary | ICD-10-CM | POA: Diagnosis not present

## 2020-06-15 DIAGNOSIS — M62838 Other muscle spasm: Secondary | ICD-10-CM | POA: Diagnosis not present

## 2020-06-15 DIAGNOSIS — M542 Cervicalgia: Secondary | ICD-10-CM | POA: Diagnosis not present

## 2020-06-15 NOTE — Therapy (Signed)
[place]. [place], [place], [address] Phone: [phone]   Fax:  [phone]  Physical Therapy Treatment  Patient Details  Name: Monique Rangel MRN: [id number] Date of Birth: [birth year] Referring Provider (PT): Hilts   Encounter Date: 06/15/2020   PT End of Session - 06/15/20 0922    Visit Number 3    Date for PT Re-Evaluation 08/01/20    PT Start Time 0842    PT Stop Time 0933    PT Time Calculation (min) 51 min    Activity Tolerance Patient tolerated treatment well    Behavior During Therapy [facility] for tasks assessed/performed           Past Medical History:  Diagnosis Date  . Anxiety 05/11/2016    History reviewed. No pertinent surgical history.  There were no vitals filed for this visit.   Subjective Assessment - 06/15/20 0848    Subjective I did not get the results last time, but I am still doing better    Currently in Pain? No/denies                             [facility] Adult PT Treatment/Exercise - 06/15/20 0001      Neck Exercises: Theraband   Shoulder Extension 20 reps;Red    Rows 20 reps;Red    Shoulder External Rotation 20 reps;Red      Neck Exercises: Standing   Neck Retraction 15 reps;3 secs    Other Standing Exercises 2# W backs, 5# shrugs with upper trap and levator stretches      Knee/Hip Exercises: Stretches   Passive Hamstring Stretch Both;3 reps;20 seconds    ITB Stretch Both;4 reps;20 seconds    Piriformis Stretch Both;3 reps;20 seconds    Gastroc Stretch Both;3 reps;20 seconds    Other Knee/Hip Stretches passive upper trap stretch with shoulder depression manually      Knee/Hip Exercises: Aerobic   Nustep level 5 x 6 minutes      Knee/Hip Exercises: Machines for Strengthening   Cybex Knee Flexion 20# 2 sets 10      Knee/Hip Exercises: Supine   Short Arc Quad Sets Right;3 sets;10 reps    Short Arc Quad Sets Limitations 5#      Traction   Type of  Traction Cervical    Max (lbs) 12#    Rest Time static    Time 12 min                    PT Short Term Goals - 06/15/20 0925      PT SHORT TERM GOAL #1   Title independent with iniital HEP    Status Achieved             PT Long Term Goals - 06/03/20 1006      PT LONG TERM GOAL #1   Title understand posture and body mechanics    Time 12    Period Weeks    Status New      PT LONG TERM GOAL #2   Title decreease pain 50%    Time 12    Period Weeks    Status New      PT LONG TERM GOAL #3   Title increase cervical ROM 25%    Time 8    Period Weeks    Status New      PT LONG TERM GOAL #4  Title report 25% less knee pain with walking    Time 8    Period Weeks    Status New                 Plan - 06/15/20 9201    Clinical Impression Statement Patient is tight in the upper trap, levator and the LE mms, we added stretches today, took away the Leg extension due to her c/o pain after last time, added SAQ.  Added some incresaed scapular stabilization, she tolerated this without difficulty    PT Next Visit Plan continue to add exercises as tolerated    Consulted and Agree with Plan of Care Patient           Patient will benefit from skilled therapeutic intervention in order to improve the following deficits and impairments:  Abnormal gait,Difficulty walking,Decreased range of motion,Increased muscle spasms,Decreased activity tolerance,Pain,Improper body mechanics,Postural dysfunction,Decreased strength,Decreased balance  Visit Diagnosis: Chronic pain of both knees  Cervicalgia  Other muscle spasm     Problem List Patient Active Problem List   Diagnosis Date Noted  . Chronic pain of both knees 05/12/2020  . Onychomycosis 05/12/2020  . GAD (generalized anxiety disorder) 04/14/2020  . Asymptomatic postmenopausal estrogen deficiency 04/14/2020  . DDD (degenerative disc disease), cervical 04/14/2020  . Migraine 04/14/2020  . Macular  degeneration, bilateral 04/14/2020  . 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 Sensorineural hearing loss (SNHL) of both ears 02/15/2017  . Mild intermittent asthma without complication 00/71/2197    Sumner Boast., PT 06/15/2020, 9:25 AM  [place]. [place], [place], [address] Phone: [phone]   Fax:  [phone]  Name: Monique Rangel MRN: [id number] Date of Birth: 1955-04-26

## 2020-06-16 ENCOUNTER — Ambulatory Visit: Payer: PPO | Admitting: Psychology

## 2020-06-19 ENCOUNTER — Other Ambulatory Visit: Payer: Self-pay

## 2020-06-19 ENCOUNTER — Ambulatory Visit: Payer: PPO | Admitting: Physical Therapy

## 2020-06-19 DIAGNOSIS — M62838 Other muscle spasm: Secondary | ICD-10-CM

## 2020-06-19 DIAGNOSIS — M542 Cervicalgia: Secondary | ICD-10-CM

## 2020-06-19 DIAGNOSIS — G8929 Other chronic pain: Secondary | ICD-10-CM

## 2020-06-19 DIAGNOSIS — M25562 Pain in left knee: Secondary | ICD-10-CM

## 2020-06-19 DIAGNOSIS — M25561 Pain in right knee: Secondary | ICD-10-CM | POA: Diagnosis not present

## 2020-06-19 NOTE — Therapy (Signed)
[place]. [place], [place], [address] Phone: [phone]   Fax:  [phone]  Physical Therapy Treatment  Patient Details  Name: Monique Rangel MRN: [id number] Date of Birth: 1955-05-10 Referring Provider (PT): Hilts   Encounter Date: 06/19/2020   PT End of Session - 06/19/20 1000    Visit Number 4    Date for PT Re-Evaluation 08/01/20    PT Start Time 0930    PT Stop Time 1015    PT Time Calculation (min) 45 min           Past Medical History:  Diagnosis Date  . Anxiety 05/11/2016    No past surgical history on file.  There were no vitals filed for this visit.   Subjective Assessment - 06/19/20 0928    Subjective played tennis this week and knees are feeling really good. neck comes and goes. overall better- therapy is helping. no radiating pain since [facility]. yawnig will trigger it at times    Currently in Pain? No/denies                             [facility] Adult PT Treatment/Exercise - 06/19/20 0001      Neck Exercises: Machines for Strengthening   Cybex Row 20# 2 sets 10    Lat Pull 20# 2 sets 10      Knee/Hip Exercises: Aerobic   Nustep level 5 x 6 minutes      Knee/Hip Exercises: Machines for Strengthening   Cybex Knee Flexion 20# 2 sets 10      Knee/Hip Exercises: Standing   Forward Step Up Both;10 reps;Hand Hold: 2;Step Height: 4"   opp leg ext     Knee/Hip Exercises: Seated   Long Arc Quad Strengthening;Both;2 sets;10 reps   green tband   Sit to [employer] 2 sets;10 reps;without UE support   10x wtball chest press 10 x OH     Shoulder Exercises: Standing   Other Standing Exercises 3 way scap stab on wall 10 each      Traction   Type of Traction Cervical    Max (lbs) 12#    Rest Time static    Time 12 min                    PT Short Term Goals - 06/15/20 0925      PT SHORT TERM GOAL #1   Title independent with iniital HEP    Status Achieved              PT Long Term Goals - 06/19/20 0953      PT LONG TERM GOAL #1   Title understand posture and body mechanics    Status Partially Met      PT LONG TERM GOAL #2   Title decreease pain 50%    Status Achieved      PT LONG TERM GOAL #3   Title increase cervical ROM 25%    Baseline flex/ext WFLS. rot and LF decreased 25% with tightness    Status Partially Met      PT LONG TERM GOAL #4   Title report 25% less knee pain with walking    Baseline 80%    Status Achieved                 Plan - 06/19/20 1000    Clinical Impression Statement all goals met except cerv  ROM limited 25% with rotation and LF with tightnes snoted- pt also verb fear of mvmt as it might trigger spasm. overall pt is progressing well and pleased with progress. cuing with ther ex needed    PT Treatment/Interventions ADLs/Self Care Home Management;Cryotherapy;Electrical Stimulation;Moist Heat;Traction;Stair training;Functional mobility training;Therapeutic activities;Therapeutic exercise;Balance training;Neuromuscular re-education;Manual techniques;Patient/family education;Dry needling    PT Next Visit Plan work to increase cerv ROM and stab           Patient will benefit from skilled therapeutic intervention in order to improve the following deficits and impairments:  Abnormal gait,Difficulty walking,Decreased range of motion,Increased muscle spasms,Decreased activity tolerance,Pain,Improper body mechanics,Postural dysfunction,Decreased strength,Decreased balance  Visit Diagnosis: Chronic pain of both knees  Cervicalgia  Other muscle spasm     Problem List Patient Active Problem List   Diagnosis Date Noted  . Chronic pain of both knees 05/12/2020  . Onychomycosis 05/12/2020  . GAD (generalized anxiety disorder) 04/14/2020  . Asymptomatic postmenopausal estrogen deficiency 04/14/2020  . DDD (degenerative disc disease), cervical 04/14/2020  . Migraine 04/14/2020  . Macular degeneration,  bilateral 04/14/2020  . 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 Sensorineural hearing loss (SNHL) of both ears 02/15/2017  . Mild intermittent asthma without complication 81/59/4707    Shineka Auble,ANGIE PTA 06/19/2020, 10:03 AM  [place]. [place], [place], [address] Phone: [phone]   Fax:  [phone]  Name: Monique Rangel MRN: [id number] Date of Birth: Jul 22, 1955

## 2020-06-23 ENCOUNTER — Encounter: Payer: Self-pay | Admitting: Physical Therapy

## 2020-06-23 ENCOUNTER — Other Ambulatory Visit: Payer: Self-pay

## 2020-06-23 ENCOUNTER — Ambulatory Visit: Payer: PPO | Admitting: Physical Therapy

## 2020-06-23 DIAGNOSIS — M25561 Pain in right knee: Secondary | ICD-10-CM | POA: Diagnosis not present

## 2020-06-23 DIAGNOSIS — G8929 Other chronic pain: Secondary | ICD-10-CM

## 2020-06-23 DIAGNOSIS — M62838 Other muscle spasm: Secondary | ICD-10-CM

## 2020-06-23 DIAGNOSIS — M25562 Pain in left knee: Secondary | ICD-10-CM

## 2020-06-23 DIAGNOSIS — M542 Cervicalgia: Secondary | ICD-10-CM

## 2020-06-23 NOTE — Therapy (Signed)
[place]. [place], [place], [address] Phone: [phone]   Fax:  [phone]  Physical Therapy Treatment  Patient Details  Name: Monique Rangel MRN: [id number] Date of Birth: 1956-02-12 Referring Provider (PT): Hilts   Encounter Date: 06/23/2020   PT End of Session - 06/23/20 1146    Visit Number 5    Date for PT Re-Evaluation 08/01/20    PT Start Time 1100    PT Stop Time 1145    PT Time Calculation (min) 45 min    Activity Tolerance Patient tolerated treatment well    Behavior During Therapy [facility] for tasks assessed/performed           Past Medical History:  Diagnosis Date  . Anxiety 05/11/2016    History reviewed. No pertinent surgical history.  There were no vitals filed for this visit.   Subjective Assessment - 06/23/20 1107    Subjective Patient reports that she is feeling very good, has been able to garden, play tennis and play pickle ball with limited difficulty, still c/o pain with going up stairs, she reports that she would like to focus on neck.  Reports difficulty sitting, reports stress does increase the pain    Currently in Pain? No/denies    Aggravating Factors  steps increase knee pain mostly going up, sitting long period increases neck pain                             [facility] Adult PT Treatment/Exercise - 06/23/20 0001      Neck Exercises: Machines for Strengthening   UBE (Upper Arm Bike) level 3 x 6 minutes      Neck Exercises: Theraband   Shoulder Extension 20 reps;Red    Rows 20 reps;Red    Shoulder External Rotation 20 reps;Red      Neck Exercises: Standing   Other Standing Exercises 3# W backs, 5# shrugs with upper trap and levator stretches      Manual Therapy   Manual Therapy Passive ROM;Manual Traction    Passive ROM gentle passive cervical ROM, some manual shoulder depression    Manual Traction occipital release, manual cervical distraction                     PT Short Term Goals - 06/15/20 0925      PT SHORT TERM GOAL #1   Title independent with iniital HEP    Status Achieved             PT Long Term Goals - 06/23/20 1149      PT LONG TERM GOAL #4   Title report 25% less knee pain with walking    Status Achieved                 Plan - 06/23/20 1147    Clinical Impression Statement Patient reports that her knees are doing very well, tolerated gardening, tennis and pickleball, she did have some right knee pain shen trying tband hip extnesion and abduction witht he right leg as the stance leg.  She wants to concentrate on the neck issues, we added thorough HEP for scapular stabilization, she returned demo with some verbal instruction.  I changed to manual traction and PROM for cerivcal today, with no c/o pain    PT Next Visit Plan work to increase cerv ROM and stab    Consulted and Agree with Plan of Care  Patient           Patient will benefit from skilled therapeutic intervention in order to improve the following deficits and impairments:  Abnormal gait,Difficulty walking,Decreased range of motion,Increased muscle spasms,Decreased activity tolerance,Pain,Improper body mechanics,Postural dysfunction,Decreased strength,Decreased balance  Visit Diagnosis: Chronic pain of both knees  Cervicalgia  Other muscle spasm     Problem List Patient Active Problem List   Diagnosis Date Noted  . Chronic pain of both knees 05/12/2020  . Onychomycosis 05/12/2020  . GAD (generalized anxiety disorder) 04/14/2020  . Asymptomatic postmenopausal estrogen deficiency 04/14/2020  . DDD (degenerative disc disease), cervical 04/14/2020  . Migraine 04/14/2020  . Macular degeneration, bilateral 04/14/2020  . 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 Sensorineural hearing loss (SNHL) of both ears 02/15/2017  . Mild intermittent asthma without complication 77/41/4239    Sumner Boast.,  PT 06/23/2020, 11:50 AM  [place]. [place], [place], [address] Phone: [phone]   Fax:  [phone]  Name: Monique Rangel MRN: [id number] Date of Birth: Aug 03, 1955

## 2020-06-23 NOTE — Patient Instructions (Signed)
Access Code: [email or web address] URL: [email or web address] Date: 06/23/2020 Prepared by: Lum Babe  Exercises Shoulder External Rotation and Scapular Retraction with Resistance - 1 x daily - 7 x weekly - 3 sets - 10 reps - 3 hold Scapular Retraction with Resistance - 1 x daily - 7 x weekly - 3 sets - 10 reps - 3 hold Single Arm Shoulder Extension with Anchored Resistance - 1 x daily - 7 x weekly - 3 sets - 10 reps - 3 hold Standing Hip Extension with Anchored Resistance - 1 x daily - 7 x weekly - 3 sets - 10 reps - 3 hold Standing Hip Abduction with Anchored Resistance - 1 x daily - 7 x weekly - 3 sets - 10 reps - 3 hold

## 2020-06-25 ENCOUNTER — Other Ambulatory Visit: Payer: Self-pay

## 2020-06-25 ENCOUNTER — Ambulatory Visit: Payer: PPO | Admitting: Physical Therapy

## 2020-06-25 DIAGNOSIS — M25561 Pain in right knee: Secondary | ICD-10-CM | POA: Diagnosis not present

## 2020-06-25 DIAGNOSIS — M62838 Other muscle spasm: Secondary | ICD-10-CM

## 2020-06-25 DIAGNOSIS — M542 Cervicalgia: Secondary | ICD-10-CM

## 2020-06-25 NOTE — Therapy (Signed)
[place]. [place], [place], [address] Phone: [phone]   Fax:  [phone]  Physical Therapy Treatment  Patient Details  Name: Monique Rangel MRN: [id number] Date of Birth: August 16, 1955 Referring Provider (PT): Hilts   Encounter Date: 06/25/2020   PT End of Session - 06/25/20 1149    Visit Number 6    Date for PT Re-Evaluation 08/01/20    PT Start Time 1100    PT Stop Time 1140    PT Time Calculation (min) 40 min           Past Medical History:  Diagnosis Date  . Anxiety 05/11/2016    No past surgical history on file.  There were no vitals filed for this visit.   Subjective Assessment - 06/25/20 1102    Subjective did mulch yesterday and then worked on computer in bed last night and sore today. 4/10 at rest up to 7/10 with mvmt    Currently in Pain? Yes    Pain Score 4     Pain Location Neck                             [facility] Adult PT Treatment/Exercise - 06/25/20 0001      Neck Exercises: Machines for Strengthening   UBE (Upper Arm Bike) level 3 x 6 minutes      Manual Therapy   Manual Therapy Soft tissue mobilization;Passive ROM;Manual Traction    Manual therapy comments very tender RT side with rotation and SB- Very tender around C4- improved ROM and less pain after MT    Soft tissue mobilization SNAGS    Passive ROM cerv PROM    Manual Traction occipital release, manual cervical distraction with ROM                  PT Education - 06/25/20 1145    Education Details [id number]- Cerv SNAGS and Rotation> educon use of tennis balls or frozen water bottle for suboccipital release    Person(s) Educated Patient    Methods Explanation;Demonstration;Handout    Comprehension Verbalized understanding;Returned demonstration            PT Short Term Goals - 06/15/20 0925      PT SHORT TERM GOAL #1   Title independent with iniital HEP    Status Achieved              PT Long Term Goals - 06/25/20 1149      PT LONG TERM GOAL #1   Title understand posture and body mechanics    Status Partially Met      PT LONG TERM GOAL #2   Title decreease pain 50%    Baseline knee pian almost resolved    Status Partially Met      PT LONG TERM GOAL #3   Title increase cervical ROM 25%    Baseline limited RT rotation and LF    Status Partially Met      PT LONG TERM GOAL #4   Title report 25% less knee pain with walking    Status Achieved                 Plan - 06/25/20 1151    Clinical Impression Statement focus on cerv today. tenderness RT cerv specifically around C4 with pain with rot and LF. Improved ROM and less pain after MT and SNAGS-issued as HEP    PT  Treatment/Interventions ADLs/Self Care Home Management;Cryotherapy;Electrical Stimulation;Moist Heat;Traction;Stair training;Functional mobility training;Therapeutic activities;Therapeutic exercise;Balance training;Neuromuscular re-education;Manual techniques;Patient/family education;Dry needling    PT Next Visit Plan assess and progress with cerv ROM possibly DN           Patient will benefit from skilled therapeutic intervention in order to improve the following deficits and impairments:  Abnormal gait,Difficulty walking,Decreased range of motion,Increased muscle spasms,Decreased activity tolerance,Pain,Improper body mechanics,Postural dysfunction,Decreased strength,Decreased balance  Visit Diagnosis: Cervicalgia  Other muscle spasm     Problem List Patient Active Problem List   Diagnosis Date Noted  . Chronic pain of both knees 05/12/2020  . Onychomycosis 05/12/2020  . GAD (generalized anxiety disorder) 04/14/2020  . Asymptomatic postmenopausal estrogen deficiency 04/14/2020  . DDD (degenerative disc disease), cervical 04/14/2020  . Migraine 04/14/2020  . Macular degeneration, bilateral 04/14/2020  . 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  Sensorineural hearing loss (SNHL) of both ears 02/15/2017  . Mild intermittent asthma without complication 97/94/8016    Erika Slaby,ANGIE PTA 06/25/2020, 11:53 AM  [place]. [place], [place], [address] Phone: [phone]   Fax:  [phone]  Name: Monique Rangel MRN: [id number] Date of Birth: [birth year]

## 2020-06-27 DIAGNOSIS — R350 Frequency of micturition: Secondary | ICD-10-CM | POA: Diagnosis not present

## 2020-06-30 ENCOUNTER — Other Ambulatory Visit: Payer: Self-pay

## 2020-06-30 ENCOUNTER — Ambulatory Visit: Payer: PPO | Admitting: Psychology

## 2020-06-30 ENCOUNTER — Ambulatory Visit: Payer: PPO | Admitting: Physical Therapy

## 2020-06-30 DIAGNOSIS — M25561 Pain in right knee: Secondary | ICD-10-CM | POA: Diagnosis not present

## 2020-06-30 DIAGNOSIS — M542 Cervicalgia: Secondary | ICD-10-CM

## 2020-06-30 DIAGNOSIS — M62838 Other muscle spasm: Secondary | ICD-10-CM

## 2020-06-30 NOTE — Patient Instructions (Signed)

## 2020-06-30 NOTE — Therapy (Signed)
[place]. [place], [place], [address] Phone: [phone]   Fax:  [phone]  Physical Therapy Treatment  Patient Details  Name: Monique Rangel MRN: [id number] Date of Birth: 28-Jan-1956 Referring Provider (PT): Hilts   Encounter Date: 06/30/2020   PT End of Session - 06/30/20 1216    Visit Number 7    Date for PT Re-Evaluation 08/01/20    PT Start Time 3875    PT Stop Time 1225    PT Time Calculation (min) 40 min           Past Medical History:  Diagnosis Date  . Anxiety 05/11/2016    No past surgical history on file.  There were no vitals filed for this visit.   Subjective Assessment - 06/30/20 1145    Subjective " I do think its better and HEP with towel helping" "Yawning will lock up RT side for about 20 sec"    Currently in Pain? Yes    Pain Score 4     Pain Location Neck                             [facility] Adult PT Treatment/Exercise - 06/30/20 0001      Modalities   Modalities Electrical Stimulation;Moist Heat      Moist Heat Therapy   Number Minutes Moist Heat 15 Minutes    Moist Heat Location Cervical      Electrical Stimulation   Electrical Stimulation Location RT cerv/sub occ and trap    Electrical Stimulation Action IFC    Electrical Stimulation Parameters sitting    Electrical Stimulation Goals Tone;Pain      Manual Therapy   Manual Therapy Soft tissue mobilization;Passive ROM;Manual Traction    Manual therapy comments STW to RT cerv and trap after DN - several tender spots and TP    Soft tissue mobilization to the right cervical area            Trigger Point Dry Needling - 06/30/20 0001    Consent Given? Yes    Education Handout Provided Yes    Muscles Treated Head and Neck Oblique capitus;Suboccipitals;Levator scapulae    Oblique Capitus Response Twitch response elicited    Suboccipitals Response Palpable increased muscle length    Levator Scapulae  Response Palpable increased muscle length                  PT Short Term Goals - 06/15/20 0925      PT SHORT TERM GOAL #1   Title independent with iniital HEP    Status Achieved             PT Long Term Goals - 06/25/20 1149      PT LONG TERM GOAL #1   Title understand posture and body mechanics    Status Partially Met      PT LONG TERM GOAL #2   Title decreease pain 50%    Baseline knee pian almost resolved    Status Partially Met      PT LONG TERM GOAL #3   Title increase cervical ROM 25%    Baseline limited RT rotation and LF    Status Partially Met      PT LONG TERM GOAL #4   Title report 25% less knee pain with walking    Status Achieved  Plan - 06/30/20 1216    Clinical Impression Statement pt felt MT last session was helpful , issues with yawning causing what sounds like muscle spasms. Today focus on STW with DN and added MH and estim. Pt very tender with TP RT cerv.    PT Treatment/Interventions ADLs/Self Care Home Management;Cryotherapy;Electrical Stimulation;Moist Heat;Traction;Stair training;Functional mobility training;Therapeutic activities;Therapeutic exercise;Balance training;Neuromuscular re-education;Manual techniques;Patient/family education;Dry needling    PT Next Visit Plan assess and progress , check goals           Patient will benefit from skilled therapeutic intervention in order to improve the following deficits and impairments:  Abnormal gait,Difficulty walking,Decreased range of motion,Increased muscle spasms,Decreased activity tolerance,Pain,Improper body mechanics,Postural dysfunction,Decreased strength,Decreased balance  Visit Diagnosis: Cervicalgia  Other muscle spasm     Problem List Patient Active Problem List   Diagnosis Date Noted  . Chronic pain of both knees 05/12/2020  . Onychomycosis 05/12/2020  . GAD (generalized anxiety disorder) 04/14/2020  . Asymptomatic postmenopausal estrogen  deficiency 04/14/2020  . DDD (degenerative disc disease), cervical 04/14/2020  . Migraine 04/14/2020  . Macular degeneration, bilateral 04/14/2020  . 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 Sensorineural hearing loss (SNHL) of both ears 02/15/2017  . Mild intermittent asthma without complication 51/76/1607    Brodrick Curran,ANGIE PTA 06/30/2020, 12:18 PM  [place]. [place], [place], [address] Phone: [phone]   Fax:  [phone]  Name: Monique Rangel MRN: [id number] Date of Birth: [birth year]

## 2020-07-03 ENCOUNTER — Ambulatory Visit: Payer: PPO | Admitting: Physical Therapy

## 2020-07-03 ENCOUNTER — Telehealth (INDEPENDENT_AMBULATORY_CARE_PROVIDER_SITE_OTHER): Payer: PPO | Admitting: Nurse Practitioner

## 2020-07-03 ENCOUNTER — Other Ambulatory Visit: Payer: Self-pay

## 2020-07-03 ENCOUNTER — Encounter: Payer: Self-pay | Admitting: Nurse Practitioner

## 2020-07-03 VITALS — Ht 66.0 in | Wt 178.0 lb

## 2020-07-03 DIAGNOSIS — R112 Nausea with vomiting, unspecified: Secondary | ICD-10-CM | POA: Diagnosis not present

## 2020-07-03 DIAGNOSIS — M25561 Pain in right knee: Secondary | ICD-10-CM | POA: Diagnosis not present

## 2020-07-03 DIAGNOSIS — M542 Cervicalgia: Secondary | ICD-10-CM

## 2020-07-03 DIAGNOSIS — M62838 Other muscle spasm: Secondary | ICD-10-CM

## 2020-07-03 DIAGNOSIS — R1013 Epigastric pain: Secondary | ICD-10-CM | POA: Diagnosis not present

## 2020-07-03 NOTE — Therapy (Signed)
[place]. [place], [place], [address] Phone: [phone]   Fax:  [phone]  Physical Therapy Treatment  Patient Details  Name: Monique Rangel MRN: [id number] Date of Birth: 03/14/1956 Referring Provider (PT): Hilts   Encounter Date: 07/03/2020   PT End of Session - 07/03/20 1138    Visit Number 8    Date for PT Re-Evaluation 08/01/20    PT Start Time 1058    PT Stop Time 1138    PT Time Calculation (min) 40 min           Past Medical History:  Diagnosis Date  . Anxiety 05/11/2016    No past surgical history on file.  There were no vitals filed for this visit.   Subjective Assessment - 07/03/20 1057    Subjective overall better. much better ROM with towel stretching and after DN. can we review some ex. 1 spasm this week while head was down    Currently in Pain? No/denies              [facility] PT Assessment - 07/03/20 0001      AROM   Overall AROM Comments cerv rotation and LF RT decreased 25%, all others WNLs                         [facility] Adult PT Treatment/Exercise - 07/03/20 0001      Neck Exercises: Machines for Strengthening   UBE (Upper Arm Bike) level 3 x 4 minutes    Cybex Row 20# 2 sets 12    Lat Pull 20# 2 sets 12      Neck Exercises: Standing   Neck Retraction 10 reps   with mutli 3# UE ex   Other Standing Exercises 3 pt red band stab ex 10 each    Other Standing Exercises ball vs wall 5 x CW and CCW      Manual Therapy   Manual Therapy Soft tissue mobilization;Passive ROM;Manual Traction    Manual therapy comments STW to RT cerv and trap after DN - several tender spots and TP    Soft tissue mobilization to the right cervical area/trap,sub occipital            Trigger Point Dry Needling - 07/03/20 0001    Consent Given? Yes    Muscles Treated Head and Neck Upper trapezius    Upper Trapezius Response Twitch reponse elicited;Palpable increased muscle length                 PT Education - 07/03/20 1116    Education Details reviewed HEP and answered pt questions, decreased LE ex to 1 set 15 as she was overwhelmed with 3 sets    Person(s) Educated Patient    Methods Explanation;Verbal cues    Comprehension Verbalized understanding;Returned demonstration            PT Short Term Goals - 06/15/20 0925      PT SHORT TERM GOAL #1   Title independent with iniital HEP    Status Achieved             PT Long Term Goals - 07/03/20 1136      PT LONG TERM GOAL #1   Title understand posture and body mechanics    Status Partially Met      PT LONG TERM GOAL #2   Title decreease pain 50%    Status Partially Met  PT LONG TERM GOAL #3   Title increase cervical ROM 25%    Status Partially Met                 Plan - 07/03/20 1138    Clinical Impression Statement reviewed and tweeked HEP. progressed cerv stab ex with cuing- UE fatigue noted. STW and DN to cerv and trap area. progressing with LTGs    PT Treatment/Interventions ADLs/Self Care Home Management;Cryotherapy;Electrical Stimulation;Moist Heat;Traction;Stair training;Functional mobility training;Therapeutic activities;Therapeutic exercise;Balance training;Neuromuscular re-education;Manual techniques;Patient/family education;Dry needling    PT Next Visit Plan assess and progress           Patient will benefit from skilled therapeutic intervention in order to improve the following deficits and impairments:  Abnormal gait,Difficulty walking,Decreased range of motion,Increased muscle spasms,Decreased activity tolerance,Pain,Improper body mechanics,Postural dysfunction,Decreased strength,Decreased balance  Visit Diagnosis: Other muscle spasm  Cervicalgia     Problem List Patient Active Problem List   Diagnosis Date Noted  . Chronic pain of both knees 05/12/2020  . Onychomycosis 05/12/2020  . GAD (generalized anxiety disorder) 04/14/2020  . Asymptomatic  postmenopausal estrogen deficiency 04/14/2020  . DDD (degenerative disc disease), cervical 04/14/2020  . Migraine 04/14/2020  . Macular degeneration, bilateral 04/14/2020  . 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 Sensorineural hearing loss (SNHL) of both ears 02/15/2017  . Mild intermittent asthma without complication 77/41/4239    Sumner Boast 07/03/2020, 11:47 AM  [place]. [place], [place], [address] Phone: [phone]   Fax:  [phone]  Name: Monique Rangel MRN: [id number] Date of Birth: 15-Apr-1955

## 2020-07-03 NOTE — Progress Notes (Signed)
Virtual Visit via Video Note  I connected with@ on 07/03/20 at  2:00 PM EDT by a video enabled telemedicine application and verified that I am speaking with the correct person using two identifiers.  Location: Patient:Home Provider: Office Participants: patient and provider  I discussed the limitations of evaluation and management by telemedicine and the availability of in person appointments. I also discussed with the patient that there may be a patient responsible charge related to this service. The patient expressed understanding and agreed to proceed.  CC: Vomiting and Diarrhea  History of Present Illness: Ms. Mailhot reports sudden onset of abdominal pain, nausea, vomiting and diarrhea which last <1hr then completely resolves spontaneously. Also associated with diaphoresis and always occurs early in AM. No hematochezia or melena or heartburn or blood in emesis. She has similar episode 1months ago and 109month ago. No known trigger.  Observations/Objective: Physical Exam Constitutional:      General: She is not in acute distress.    Appearance: She is not ill-appearing, toxic-appearing or diaphoretic.  Pulmonary:     Effort: Pulmonary effort is normal.  Abdominal:     General: There is no distension.  Neurological:     Mental Status: She is alert and oriented to person, place, and time.    Assessment and Plan: Avon was seen today for acute visit.  Diagnoses and all orders for this visit:  Epigastric pain -     US Abdomen Limited RUQ (LIVER/GB); Future  Nausea and vomiting, intractability of vomiting not specified, unspecified vomiting type -     US Abdomen Limited RUQ (LIVER/GB); Future   Follow Up Instructions: Maintain Bland diet and adequate oral hydration. Avoid ASA and NSAIDs.   I discussed the assessment and treatment plan with the patient. The patient was provided an opportunity to ask questions and all were answered. The patient agreed with the plan and  demonstrated an understanding of the instructions.   The patient was advised to call back or seek an in-person evaluation if the symptoms worsen or if the condition fails to improve as anticipated.  Wilfred Lacy, NP

## 2020-07-14 ENCOUNTER — Ambulatory Visit: Payer: PPO | Attending: Nurse Practitioner | Admitting: Physical Therapy

## 2020-07-14 ENCOUNTER — Other Ambulatory Visit: Payer: Self-pay

## 2020-07-14 DIAGNOSIS — G8929 Other chronic pain: Secondary | ICD-10-CM | POA: Insufficient documentation

## 2020-07-14 DIAGNOSIS — M62838 Other muscle spasm: Secondary | ICD-10-CM | POA: Diagnosis not present

## 2020-07-14 DIAGNOSIS — M25561 Pain in right knee: Secondary | ICD-10-CM | POA: Insufficient documentation

## 2020-07-14 DIAGNOSIS — M542 Cervicalgia: Secondary | ICD-10-CM | POA: Diagnosis not present

## 2020-07-14 DIAGNOSIS — M25562 Pain in left knee: Secondary | ICD-10-CM | POA: Diagnosis not present

## 2020-07-14 NOTE — Therapy (Signed)
[place]. [place], [place], [address] Phone: [phone]   Fax:  [phone]  Physical Therapy Treatment  Patient Details  Name: Monique Rangel MRN: [id number] Date of Birth: 07/13/1955 Referring Provider (PT): Hilts   Encounter Date: 07/14/2020   PT End of Session - 07/14/20 1139    Visit Number 9    Date for PT Re-Evaluation 08/01/20    PT Start Time 1055    PT Stop Time 1135    PT Time Calculation (min) 40 min           Past Medical History:  Diagnosis Date  . Anxiety 05/11/2016    No past surgical history on file.  There were no vitals filed for this visit.   Subjective Assessment - 07/14/20 1102    Subjective overall 85% better and achieving my goals, still spasms with fwd mvt ( sitting/ computer and yawning still triggers it)    Currently in Pain? Yes    Pain Score 2     Pain Location Neck    Pain Orientation Right              [facility] PT Assessment - 07/14/20 0001      AROM   Overall AROM Comments cerv ROM WFLs except RT LF painful                         [facility] Adult PT Treatment/Exercise - 07/14/20 0001      Neck Exercises: Machines for Strengthening   UBE (Upper Arm Bike) level 3 x 4 minutes    Lat Pull 25# 2 sets 15    Other Machines for Strengthening 10# pulleys row and ext 15 x each    Other Machines for Strengthening green tband various stab UE ex 10 reps each      Neck Exercises: Standing   Neck Retraction 10 reps;3 secs   3# various UE ex with head on ball in retraction   Other Standing Exercises 3# 4 pt rhy stab 10 each    Other Standing Exercises wt ball UE/cerv ext      Manual Therapy   Manual Therapy Soft tissue mobilization;Passive ROM;Muscle Energy Technique    Manual therapy comments increased RT LF after mulligan , TP in RT UT    Soft tissue mobilization RT cerv,SCM and trap    Passive ROM cerv RT rot and LF    Muscle Energy Technique Mulligan to  increase RT LF and rotation                    PT Short Term Goals - 06/15/20 0925      PT SHORT TERM GOAL #1   Title independent with iniital HEP    Status Achieved             PT Long Term Goals - 07/03/20 1136      PT LONG TERM GOAL #1   Title understand posture and body mechanics    Status Partially Met      PT LONG TERM GOAL #2   Title decreease pain 50%    Status Partially Met      PT LONG TERM GOAL #3   Title increase cervical ROM 25%    Status Partially Met                 Plan - 07/14/20 1140    Clinical Impression Statement cerv ROM WFLs  except RT LF limited but responded well with increased ROM after MT. TP in RT UT ( pt defered DN to next session). worked on Psychiatrist as pt states symptoms come with prolonged sitting and computer work.    PT Treatment/Interventions ADLs/Self Care Home Management;Cryotherapy;Electrical Stimulation;Moist Heat;Traction;Stair training;Functional mobility training;Therapeutic activities;Therapeutic exercise;Balance training;Neuromuscular re-education;Manual techniques;Patient/family education;Dry needling    PT Next Visit Plan check goals, 10th visit. DN RT UT           Patient will benefit from skilled therapeutic intervention in order to improve the following deficits and impairments:  Abnormal gait,Difficulty walking,Decreased range of motion,Increased muscle spasms,Decreased activity tolerance,Pain,Improper body mechanics,Postural dysfunction,Decreased strength,Decreased balance  Visit Diagnosis: Other muscle spasm  Cervicalgia     Problem List Patient Active Problem List   Diagnosis Date Noted  . Chronic pain of both knees 05/12/2020  . Onychomycosis 05/12/2020  . GAD (generalized anxiety disorder) 04/14/2020  . Asymptomatic postmenopausal estrogen deficiency 04/14/2020  . DDD (degenerative disc disease), cervical 04/14/2020  . Migraine 04/14/2020  . Macular degeneration,  bilateral 04/14/2020  . 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 Sensorineural hearing loss (SNHL) of both ears 02/15/2017  . Mild intermittent asthma without complication 16/12/9602    Omie Ferger,ANGIE PTA 07/14/2020, 11:42 AM  [place] [address] [place], [place], [address] Phone: [phone]   Fax:  [phone]  Name: Monique Rangel MRN: [id number] Date of Birth: March 17, 1955

## 2020-07-16 ENCOUNTER — Encounter: Payer: Self-pay | Admitting: Physical Therapy

## 2020-07-16 ENCOUNTER — Other Ambulatory Visit: Payer: Self-pay

## 2020-07-16 ENCOUNTER — Ambulatory Visit: Payer: PPO | Admitting: Physical Therapy

## 2020-07-16 DIAGNOSIS — M542 Cervicalgia: Secondary | ICD-10-CM

## 2020-07-16 DIAGNOSIS — G8929 Other chronic pain: Secondary | ICD-10-CM

## 2020-07-16 DIAGNOSIS — M62838 Other muscle spasm: Secondary | ICD-10-CM | POA: Diagnosis not present

## 2020-07-16 DIAGNOSIS — M25562 Pain in left knee: Secondary | ICD-10-CM

## 2020-07-16 NOTE — Therapy (Signed)
[place]. [place], [place], [address] Phone: [phone]   Fax:  [phone]  Physical Therapy Treatment  Patient Details  Name: Monique Rangel MRN: [id number] Date of Birth: May 05, 1955 Referring Provider (PT): Hilts   Encounter Date: 07/16/2020   PT End of Session - 07/16/20 1612    Visit Number 10    Date for PT Re-Evaluation 08/01/20    PT Start Time 2010    PT Stop Time 1613    PT Time Calculation (min) 43 min    Activity Tolerance Patient tolerated treatment well    Behavior During Therapy [facility] for tasks assessed/performed           Past Medical History:  Diagnosis Date  . Anxiety 05/11/2016    History reviewed. No pertinent surgical history.  There were no vitals filed for this visit.   Subjective Assessment - 07/16/20 1538    Subjective "I am very pleased at how I am doing, I am almost back to doing everything with much less difficulty"    Currently in Pain? Yes    Pain Score 2     Pain Location Neck    Pain Orientation Right    Aggravating Factors  sitting, end range cervical motions                             [facility] Adult PT Treatment/Exercise - 07/16/20 0001      Neck Exercises: Machines for Strengthening   UBE (Upper Arm Bike) level 3 x 5 minutes    Cybex Row 20# 2 sets 12    Lat Pull 25# 2 sets 15    Other Machines for Strengthening 10# chest press    Other Machines for Strengthening green tband various stab UE ex 10 reps each      Manual Therapy   Manual Therapy Soft tissue mobilization;Passive ROM;Muscle Energy Technique    Soft tissue mobilization RT cerv,SCM and trap    Passive ROM cerv RT rot and LF    Manual Traction occipital release, manual cervical distraction with ROM            Trigger Point Dry Needling - 07/16/20 0001    Consent Given? Yes    Muscles Treated Head and Neck Upper trapezius    Upper Trapezius Response Twitch reponse  elicited;Palpable increased muscle length    Levator Scapulae Response Palpable increased muscle length                  PT Short Term Goals - 06/15/20 0925      PT SHORT TERM GOAL #1   Title independent with iniital HEP    Status Achieved             PT Long Term Goals - 07/16/20 1714      PT LONG TERM GOAL #1   Title understand posture and body mechanics      PT LONG TERM GOAL #2   Title decreease pain 50%    Status Achieved      PT LONG TERM GOAL #3   Status Partially Met                 Plan - 07/16/20 1710    Clinical Impression Statement Patient is doing great, she is getting back to all of her PLOF activities with less pain, she still has some right neck pain at end ranges and with  yawning.  She still has some pretty significant spasms and trigger points in the right upper trap, levator area.    PT Next Visit Plan continue to [place] on the trigger points and the function    Consulted and Agree with Plan of Care Patient           Patient will benefit from skilled therapeutic intervention in order to improve the following deficits and impairments:  Abnormal gait,Difficulty walking,Decreased range of motion,Increased muscle spasms,Decreased activity tolerance,Pain,Improper body mechanics,Postural dysfunction,Decreased strength,Decreased balance  Visit Diagnosis: Other muscle spasm  Cervicalgia  Chronic pain of both knees     Problem List Patient Active Problem List   Diagnosis Date Noted  . Chronic pain of both knees 05/12/2020  . Onychomycosis 05/12/2020  . GAD (generalized anxiety disorder) 04/14/2020  . Asymptomatic postmenopausal estrogen deficiency 04/14/2020  . DDD (degenerative disc disease), cervical 04/14/2020  . Migraine 04/14/2020  . Macular degeneration, bilateral 04/14/2020  . 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 Sensorineural hearing loss (SNHL) of both ears 02/15/2017  . Mild intermittent asthma  without complication 22/24/1146    Sumner Boast., PT 07/16/2020, 5:14 PM  [place]. [place], [place], [address] Phone: [phone]   Fax:  [phone]  Name: Monique Rangel MRN: [id number] Date of Birth: 04/01/1955

## 2020-07-21 ENCOUNTER — Other Ambulatory Visit: Payer: Self-pay

## 2020-07-21 ENCOUNTER — Ambulatory Visit
Admission: RE | Admit: 2020-07-21 | Discharge: 2020-07-21 | Disposition: A | Discharge status: Home / Self Care | Payer: PPO | Source: Ambulatory Visit | Attending: Nurse Practitioner | Admitting: Nurse Practitioner

## 2020-07-21 ENCOUNTER — Ambulatory Visit: Payer: PPO | Admitting: Physical Therapy

## 2020-07-21 DIAGNOSIS — M62838 Other muscle spasm: Secondary | ICD-10-CM | POA: Diagnosis not present

## 2020-07-21 DIAGNOSIS — M542 Cervicalgia: Secondary | ICD-10-CM

## 2020-07-21 DIAGNOSIS — R1013 Epigastric pain: Secondary | ICD-10-CM | POA: Diagnosis not present

## 2020-07-21 DIAGNOSIS — R112 Nausea with vomiting, unspecified: Secondary | ICD-10-CM

## 2020-07-21 NOTE — Therapy (Signed)
[place]. [place], [place], [address] Phone: [phone]   Fax:  [phone]  Physical Therapy Treatment  Patient Details  Name: DESTANIE TIBBETTS MRN: [id number] Date of Birth: [birth year] Referring Provider (PT): Hilts   Encounter Date: 07/21/2020   PT End of Session - 07/21/20 1222    Visit Number 11    Date for PT Re-Evaluation 08/01/20    PT Start Time 7048    PT Stop Time 1222    PT Time Calculation (min) 40 min           Past Medical History:  Diagnosis Date  . Anxiety 05/11/2016    No past surgical history on file.  There were no vitals filed for this visit.   Subjective Assessment - 07/21/20 1148    Subjective mom fell and stress of that has made neck hurt, hard core spasms better    Currently in Pain? Yes    Pain Score 4     Pain Location Neck    Pain Orientation Right                             [facility] Adult PT Treatment/Exercise - 07/21/20 0001      Neck Exercises: Machines for Strengthening   UBE (Upper Arm Bike) L 5 2 min fwd/2 min backward    Lat Pull 25# 2 sets 15    Other Machines for Strengthening 10# cable pulleys variation ex      Modalities   Modalities Electrical Stimulation;Ultrasound      Ultrasound   Ultrasound Location with estime RT cer,trap,rhom    Ultrasound Parameters 1.2 w/cm2 100%    Ultrasound Goals Pain;Other (Comment)   tightness     Manual Therapy   Manual Therapy Soft tissue mobilization    Soft tissue mobilization Rt cerv,trap,rhom-DSTW                    PT Short Term Goals - 06/15/20 0925      PT SHORT TERM GOAL #1   Title independent with iniital HEP    Status Achieved             PT Long Term Goals - 07/16/20 1714      PT LONG TERM GOAL #1   Title understand posture and body mechanics      PT LONG TERM GOAL #2   Title decreease pain 50%    Status Achieved      PT LONG TERM GOAL #3   Status Partially Met                  Plan - 07/21/20 1223    Clinical Impression Statement pt arrived with increased c/o pain/tightness- stress from mom having fall. responded well to combo tx and STW with less TP as compared to last time this PTA worked with pt    PT Treatment/Interventions ADLs/Self Care Home Management;Cryotherapy;Electrical Stimulation;Moist Heat;Traction;Stair training;Functional mobility training;Therapeutic activities;Therapeutic exercise;Balance training;Neuromuscular re-education;Manual techniques;Patient/family education;Dry needling    PT Next Visit Plan assess and progress           Patient will benefit from skilled therapeutic intervention in order to improve the following deficits and impairments:  Abnormal gait,Difficulty walking,Decreased range of motion,Increased muscle spasms,Decreased activity tolerance,Pain,Improper body mechanics,Postural dysfunction,Decreased strength,Decreased balance  Visit Diagnosis: Cervicalgia  Other muscle spasm     Problem List Patient Active Problem List   Diagnosis  Date Noted  . Chronic pain of both knees 05/12/2020  . Onychomycosis 05/12/2020  . GAD (generalized anxiety disorder) 04/14/2020  . Asymptomatic postmenopausal estrogen deficiency 04/14/2020  . DDD (degenerative disc disease), cervical 04/14/2020  . Migraine 04/14/2020  . Macular degeneration, bilateral 04/14/2020  . 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 Sensorineural hearing loss (SNHL) of both ears 02/15/2017  . Mild intermittent asthma without complication 19/03/2222    Markes Shatswell,ANGIE PTA 07/21/2020, 12:25 PM  [place]. [place], [place], [address] Phone: [phone]   Fax:  [phone]  Name: MELYNDA KRZYWICKI MRN: [id number] Date of Birth: [birth year]

## 2020-07-23 NOTE — Addendum Note (Signed)
Addended by: Leana Gamer on: 07/23/2020 07:45 PM   Modules accepted: Orders

## 2020-07-28 ENCOUNTER — Encounter: Payer: Self-pay | Admitting: Physical Therapy

## 2020-07-28 ENCOUNTER — Ambulatory Visit: Payer: PPO | Admitting: Physical Therapy

## 2020-07-28 ENCOUNTER — Other Ambulatory Visit: Payer: Self-pay

## 2020-07-28 DIAGNOSIS — M542 Cervicalgia: Secondary | ICD-10-CM

## 2020-07-28 DIAGNOSIS — M62838 Other muscle spasm: Secondary | ICD-10-CM

## 2020-07-28 NOTE — Therapy (Signed)
[place]. [place], [place], [address] Phone: [phone]   Fax:  [phone]  Physical Therapy Treatment  Patient Details  Name: Monique Rangel MRN: [id number] Date of Birth: 01-20-1956 Referring Provider (PT): Hilts   Encounter Date: 07/28/2020   PT End of Session - 07/28/20 1516    Visit Number 12    Date for PT Re-Evaluation 08/01/20    PT Start Time 1440    PT Stop Time 1520    PT Time Calculation (min) 40 min    Activity Tolerance Patient tolerated treatment well    Behavior During Therapy [facility] for tasks assessed/performed           Past Medical History:  Diagnosis Date  . Anxiety 05/11/2016    History reviewed. No pertinent surgical history.  There were no vitals filed for this visit.   Subjective Assessment - 07/28/20 1446    Subjective Reports that she still having increased stress and pain from her mom falling    Currently in Pain? Yes    Pain Score 5     Pain Location Neck    Pain Orientation Right    Pain Descriptors / Indicators Sore;Spasm;Tightness    Aggravating Factors  stress                             [facility] Adult PT Treatment/Exercise - 07/28/20 0001      Neck Exercises: Machines for Strengthening   UBE (Upper Arm Bike) L 5 2 min fwd/2 min backward      Shoulder Exercises: Standing   Other Standing Exercises 5# shrugs with upper trap and levator stretches      Manual Therapy   Manual Therapy Soft tissue mobilization    Soft tissue mobilization Rt cerv,trap,rhom-DSTW    Passive ROM cerv RT rot and LF    Manual Traction occipital release, manual cervical distraction with ROM                    PT Short Term Goals - 06/15/20 0925      PT SHORT TERM GOAL #1   Title independent with iniital HEP    Status Achieved             PT Long Term Goals - 07/16/20 1714      PT LONG TERM GOAL #1   Title understand posture and body mechanics       PT LONG TERM GOAL #2   Title decreease pain 50%    Status Achieved      PT LONG TERM GOAL #3   Status Partially Met                 Plan - 07/28/20 1517    Clinical Impression Statement Patient continues to have increased tightness and pain in the right cervical area more laterally, she had her mom fall and her mom has been in pain for a bout a week and she is worried about her.  The upper trap is still tight and tender but the SCM and deep cervical mms aree very tight and tender today    PT Next Visit Plan may need renewal next visit    Consulted and Agree with Plan of Care Patient           Patient will benefit from skilled therapeutic intervention in order to improve the following deficits and impairments:  Abnormal gait,Difficulty  walking,Decreased range of motion,Increased muscle spasms,Decreased activity tolerance,Pain,Improper body mechanics,Postural dysfunction,Decreased strength,Decreased balance  Visit Diagnosis: Cervicalgia  Other muscle spasm     Problem List Patient Active Problem List   Diagnosis Date Noted  . Chronic pain of both knees 05/12/2020  . Onychomycosis 05/12/2020  . GAD (generalized anxiety disorder) 04/14/2020  . Asymptomatic postmenopausal estrogen deficiency 04/14/2020  . DDD (degenerative disc disease), cervical 04/14/2020  . Migraine 04/14/2020  . Macular degeneration, bilateral 04/14/2020  . 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 Sensorineural hearing loss (SNHL) of both ears 02/15/2017  . Mild intermittent asthma without complication 28/83/3744    Sumner Boast., PT 07/28/2020, 3:19 PM  [place]. [place], [place], [address] Phone: [phone]   Fax:  [phone]  Name: Monique Rangel MRN: [id number] Date of Birth: [birth year]

## 2020-07-30 ENCOUNTER — Telehealth: Payer: Self-pay

## 2020-07-30 NOTE — Telephone Encounter (Signed)
Called patient and lvm for patient to call back and schedule a appointment with Dr.Hilts    Approved [employer] knee Buy&Bill Patient will responsible for 20% oop  $30 Co Pay No PA Required

## 2020-08-11 ENCOUNTER — Ambulatory Visit: Payer: PPO | Admitting: Physical Therapy

## 2020-08-18 ENCOUNTER — Ambulatory Visit: Payer: PPO | Admitting: Physical Therapy

## 2020-08-22 DIAGNOSIS — Z20822 Contact with and (suspected) exposure to covid-19: Secondary | ICD-10-CM | POA: Diagnosis not present

## 2020-09-01 ENCOUNTER — Ambulatory Visit: Payer: PPO | Admitting: Physical Therapy

## 2020-09-10 ENCOUNTER — Other Ambulatory Visit: Payer: PPO

## 2020-10-06 ENCOUNTER — Other Ambulatory Visit: Payer: Self-pay

## 2020-10-08 ENCOUNTER — Ambulatory Visit (INDEPENDENT_AMBULATORY_CARE_PROVIDER_SITE_OTHER): Payer: PPO | Admitting: Family Medicine

## 2020-10-08 ENCOUNTER — Encounter: Payer: Self-pay | Admitting: Family Medicine

## 2020-10-08 DIAGNOSIS — J069 Acute upper respiratory infection, unspecified: Secondary | ICD-10-CM

## 2020-10-08 NOTE — Progress Notes (Signed)
Virtual Visit via Video Note  I connected with Monique Rangel on 10/08/20 at  9:00 AM EDT by a video enabled telemedicine application and verified that I am speaking with the correct person using two identifiers. Location patient: home Location provider: work Persons participating in the virtual visit: patient, provider  I discussed the limitations of evaluation and management by telemedicine and the availability of in person appointments. The patient expressed understanding and agreed to proceed.  Chief Complaint  Patient presents with   Follow-up    Pt c/o cough and SOB x1 day, no fever, no congestion. rapid at-home Covid test negative.      HPI: Monique Rangel is a 65 y.o. female patient who complains of intermittent Lt ear fullness or water-like sensation in it. Resolved 2 days ago.  Pt started with cough and some chest congestion yesterday afternoon. "Slight" SOB. She has mild intermittent asthma but has not used albuterol inhaler. No fever. Denies HA, runny nose, nasal congestion, sore throat. Baseline PND. Cough is occasionally productive - white mucous. Home/rapid covid test negative.    Past Medical History:  Diagnosis Date   Anxiety 05/11/2016    History reviewed. No pertinent surgical history.  Family History  Problem Relation Age of Onset   Breast cancer Neg Hx     Social History   Tobacco Use   Smoking status: Never   Smokeless tobacco: Never  Substance Use Topics   Alcohol use: Yes    Comment: Moderate   Drug use: Never     Current Outpatient Medications:    albuterol (VENTOLIN HFA) 108 (90 Base) MCG/ACT inhaler, Inhale 1-2 puffs into the lungs every 6 (six) hours as needed for wheezing or shortness of breath., Disp: 8 g, Rfl: 0   cyclobenzaprine (FLEXERIL) 5 MG tablet, Take 1 tablet (5 mg total) by mouth 3 (three) times daily as needed for muscle spasms., Disp: 90 tablet, Rfl: 1   diphenhydrAMINE (BENADRYL) 25 MG tablet, Take 25 mg by mouth at bedtime as  needed., Disp: , Rfl:    DULoxetine (CYMBALTA) 20 MG capsule, Take 1 capsule (20 mg total) by mouth daily., Disp: 30 capsule, Rfl: 5   fluticasone (FLONASE) 50 MCG/ACT nasal spray, Place 2 sprays into both nostrils every morning., Disp: , Rfl:    glucosamine-chondroitin 500-400 MG tablet, Take 1 tablet by mouth 3 (three) times daily., Disp: , Rfl:    nitrofurantoin, macrocrystal-monohydrate, (MACROBID) 100 MG capsule, Take 100 mg by mouth 2 (two) times daily., Disp: , Rfl:   Allergies  Allergen Reactions   Lamisil [Terbinafine] Rash   Quinine Other (See Comments)    Mother had a bad reaction so does not want to use it Mother had a bad reaction so does not want to use it       ROS: See pertinent positives and negatives per HPI.   EXAM:  VITALS per patient if applicable: There were no vitals taken for this visit.   GENERAL: alert, oriented, and in no acute distress  HEENT: atraumatic, conjunctiva clear, no obvious abnormalities on inspection of external nose and ears  NECK: normal movements of the head and neck  LUNGS: on inspection no signs of respiratory distress, breathing rate appears normal, no obvious gross SOB, gasping or wheezing, no conversational dyspnea  CV: no obvious cyanosis  MS: moves all visible extremities without noticeable abnormality  PSYCH/NEURO: pleasant and cooperative, no obvious depression or anxiety, speech and thought processing grossly intact   ASSESSMENT AND PLAN:  1. Viral URI with cough - cough x less than 24hrs - recommend supportive care - use albuterol inhaler q6 and PRN - start mucinex or mucine DM BID - retest for covid in 24-36hrs - f/u if symptoms worsen or do not improve in 1 wk     I discussed the assessment and treatment plan with the patient. The patient was provided an opportunity to ask questions and all were answered. The patient agreed with the plan and demonstrated an understanding of the instructions.   The patient  was advised to call back or seek an in-person evaluation if the symptoms worsen or if the condition fails to improve as anticipated.   Letta Median, DO

## 2020-10-20 ENCOUNTER — Encounter: Payer: Self-pay | Admitting: Family Medicine

## 2020-10-20 ENCOUNTER — Other Ambulatory Visit: Payer: Self-pay

## 2020-10-20 ENCOUNTER — Ambulatory Visit (INDEPENDENT_AMBULATORY_CARE_PROVIDER_SITE_OTHER): Payer: PPO | Admitting: Family Medicine

## 2020-10-20 VITALS — Ht 66.0 in

## 2020-10-20 DIAGNOSIS — J0110 Acute frontal sinusitis, unspecified: Secondary | ICD-10-CM

## 2020-10-20 DIAGNOSIS — H6993 Unspecified Eustachian tube disorder, bilateral: Secondary | ICD-10-CM

## 2020-10-20 DIAGNOSIS — L740 Miliaria rubra: Secondary | ICD-10-CM

## 2020-10-20 DIAGNOSIS — H6501 Acute serous otitis media, right ear: Secondary | ICD-10-CM | POA: Diagnosis not present

## 2020-10-20 DIAGNOSIS — H6983 Other specified disorders of Eustachian tube, bilateral: Secondary | ICD-10-CM | POA: Diagnosis not present

## 2020-10-20 MED ORDER — PREDNISONE 20 MG PO TABS: 20.0000 mg | ORAL_TABLET | Freq: Two times a day (BID) | ORAL | 0 refills | Status: AC

## 2020-10-20 MED ORDER — AMOXICILLIN 875 MG PO TABS: 875.0000 mg | ORAL_TABLET | Freq: Two times a day (BID) | ORAL | 0 refills | Status: AC

## 2020-10-20 NOTE — Progress Notes (Signed)
Established Patient Office Visit  Subjective:  Patient ID: Monique Rangel, female    DOB: [birth year]  Age: 65 y.o. MRN: [id number]  CC:  Chief Complaint  Patient presents with   Sinusitis    Sinusitis headache, pain and pressure in right ear, left ear feels full. Symptoms x 1 month. Possible sun rash.     HPI Emaline W Caccamo presents for evaluation of sudden onset of facial pressure above her right eye, bilateral ear congestion discomfort and muffled hearing.  There is been no fever or chills.  Appreciates some postnasal drip.  There is been no rhinorrhea.  History of ongoing allergy rhinitis.  She faithfully uses Flonase daily and has been using it for years.  She uses Advil sinus as needed.  She has a rash on her upper chest.  Past Medical History:  Diagnosis Date   Anxiety 05/11/2016    No past surgical history on file.  Family History  Problem Relation Age of Onset   Breast cancer Neg Hx     Social History   Socioeconomic History   Marital status: Married    Spouse name: Not on file   Number of children: 2   Years of education: Not on file   Highest education level: Not on file  Occupational History   Not on file  Tobacco Use   Smoking status: Never   Smokeless tobacco: Never  Substance and Sexual Activity   Alcohol use: Yes    Comment: Moderate   Drug use: Never   Sexual activity: Not Currently    Birth control/protection: Post-menopausal  Other Topics Concern   Not on file  Social History Narrative   Not on file   Social Determinants of Health   Financial Resource Strain: Not on file  Food Insecurity: Not on file  Transportation Needs: Not on file  Physical Activity: Not on file  Stress: Not on file  Social Connections: Not on file  Intimate Partner Violence: Not on file    Outpatient Medications Prior to Visit  Medication Sig Dispense Refill   albuterol (VENTOLIN HFA) 108 (90 Base) MCG/ACT inhaler Inhale 1-2 puffs into the lungs every 6 (six)  hours as needed for wheezing or shortness of breath. 8 g 0   diphenhydrAMINE (BENADRYL) 25 MG tablet Take 25 mg by mouth at bedtime as needed.     DULoxetine (CYMBALTA) 20 MG capsule Take 1 capsule (20 mg total) by mouth daily. 30 capsule 5   fluticasone (FLONASE) 50 MCG/ACT nasal spray Place 2 sprays into both nostrils every morning.     glucosamine-chondroitin 500-400 MG tablet Take 1 tablet by mouth 3 (three) times daily.     cyclobenzaprine (FLEXERIL) 5 MG tablet Take 1 tablet (5 mg total) by mouth 3 (three) times daily as needed for muscle spasms. (Patient not taking: Reported on 10/20/2020) 90 tablet 1   nitrofurantoin, macrocrystal-monohydrate, (MACROBID) 100 MG capsule Take 100 mg by mouth 2 (two) times daily.     No facility-administered medications prior to visit.    Allergies  Allergen Reactions   Lamisil [Terbinafine] Rash   Quinine Other (See Comments)    Mother had a bad reaction so does not want to use it Mother had a bad reaction so does not want to use it     ROS Review of Systems  Constitutional:  Negative for chills, diaphoresis, fatigue, fever and unexpected weight change.  HENT:  Positive for congestion, ear pain, hearing loss, postnasal drip, sinus pressure and  sinus pain. Negative for ear discharge, rhinorrhea, sore throat and voice change.   Eyes:  Negative for photophobia and visual disturbance.  Respiratory:  Negative for cough and wheezing.   Cardiovascular: Negative.   Skin:  Positive for rash. Negative for color change.  Neurological:  Negative for speech difficulty and weakness.  Psychiatric/Behavioral: Negative.       Objective:    Physical Exam Vitals and nursing note reviewed.  Constitutional:      General: She is not in acute distress.    Appearance: Normal appearance. She is not ill-appearing, toxic-appearing or diaphoretic.  HENT:     Head: Normocephalic and atraumatic.     Right Ear: A middle ear effusion is present. Tympanic membrane is  injected, erythematous and retracted.     Left Ear: Tympanic membrane is retracted.     Mouth/Throat:     Mouth: Mucous membranes are moist.     Pharynx: Oropharynx is clear. No oropharyngeal exudate or posterior oropharyngeal erythema.  Eyes:     General: No scleral icterus.       Right eye: No discharge.        Left eye: No discharge.     Extraocular Movements: Extraocular movements intact.     Conjunctiva/sclera: Conjunctivae normal.     Pupils: Pupils are equal, round, and reactive to light.  Cardiovascular:     Rate and Rhythm: Normal rate and regular rhythm.  Pulmonary:     Effort: Pulmonary effort is normal.     Breath sounds: Normal breath sounds.  Musculoskeletal:     Cervical back: No rigidity or tenderness.  Lymphadenopathy:     Cervical: No cervical adenopathy.  Skin:    General: Skin is warm and dry.     Findings: Rash present. Rash is macular and papular.     Comments: On the upper mid chest are fine erythematous papules  Neurological:     Mental Status: She is alert and oriented to person, place, and time.  Psychiatric:        Mood and Affect: Mood normal.        Behavior: Behavior normal.  Yeah I just printed her AVS today  Ht '5\' 6"'$  (1.676 m)   BMI 28.73 kg/m  Wt Readings from Last 3 Encounters:  07/03/20 178 lb (80.7 kg)  05/12/20 177 lb 9.6 oz (80.6 kg)  04/14/20 178 lb 9.6 oz (81 kg)     Health Maintenance Due  Topic Date Due   TETANUS/TDAP  Never done   Zoster Vaccines- Shingrix (1 of 2) Never done   PAP SMEAR-Modifier  07/30/2019   DEXA SCAN  Never done   PNA vac Low Risk Adult (1 of 2 - PCV13) Never done   INFLUENZA VACCINE  10/12/2020   COVID-19 Vaccine (5 - Booster for Pfizer series) 10/21/2020    There are no preventive care reminders to display for this patient.  Lab Results  Component Value Date   TSH 1.69 04/14/2020   Lab Results  Component Value Date   WBC 5.9 04/14/2020   HGB 13.7 04/14/2020   HCT 40.9 04/14/2020   MCV 88.6  04/14/2020   PLT 239.0 04/14/2020   Lab Results  Component Value Date   NA 139 04/14/2020   K 4.3 04/14/2020   CO2 29 04/14/2020   GLUCOSE 106 (H) 04/14/2020   BUN 17 04/14/2020   CREATININE 0.69 04/14/2020   BILITOT 1.0 05/12/2020   ALKPHOS 72 05/12/2020   AST 18 05/12/2020  ALT 12 05/12/2020   PROT 6.8 05/12/2020   ALBUMIN 4.4 05/12/2020   CALCIUM 9.3 04/14/2020   GFR 91.27 04/14/2020   Lab Results  Component Value Date   CHOL 227 (H) 04/14/2020   Lab Results  Component Value Date   HDL 76.60 04/14/2020   Lab Results  Component Value Date   LDLCALC 125 (H) 04/14/2020   Lab Results  Component Value Date   TRIG 127.0 04/14/2020   Lab Results  Component Value Date   CHOLHDL 3 04/14/2020   No results found for: HGBA1C    Assessment & Plan:   Problem List Items Addressed This Visit   None Visit Diagnoses     Acute frontal sinusitis, recurrence not specified    -  Primary   Relevant Medications   amoxicillin (AMOXIL) 875 MG tablet   predniSONE (DELTASONE) 20 MG tablet   Dysfunction of both eustachian tubes       Relevant Medications   predniSONE (DELTASONE) 20 MG tablet   Right acute serous otitis media, recurrence not specified       Relevant Medications   amoxicillin (AMOXIL) 875 MG tablet   predniSONE (DELTASONE) 20 MG tablet   Heat rash       Relevant Medications   predniSONE (DELTASONE) 20 MG tablet       Meds ordered this encounter  Medications   amoxicillin (AMOXIL) 875 MG tablet    Sig: Take 1 tablet (875 mg total) by mouth 2 (two) times daily for 10 days.    Dispense:  20 tablet    Refill:  0   predniSONE (DELTASONE) 20 MG tablet    Sig: Take 1 tablet (20 mg total) by mouth 2 (two) times daily with a meal for 7 days.    Dispense:  14 tablet    Refill:  0    Follow-up: Return in about 2 weeks (around 11/03/2020), or if symptoms worsen or fail to improve continue flonase..  Continue daily Flonase.  May use Advil sinus as needed.   Continue hydrocortisone cream for rash.  Use sunscreen.  Libby Maw, MD

## 2020-10-23 ENCOUNTER — Other Ambulatory Visit: Payer: Self-pay | Admitting: Nurse Practitioner

## 2020-10-23 DIAGNOSIS — F411 Generalized anxiety disorder: Secondary | ICD-10-CM

## 2020-10-27 NOTE — Telephone Encounter (Signed)
Chart supports rx refill Last ov: 07/03/2020  Last refill: 09/21/2020

## 2020-10-30 ENCOUNTER — Encounter: Payer: Self-pay | Admitting: Family Medicine

## 2020-10-30 ENCOUNTER — Ambulatory Visit (INDEPENDENT_AMBULATORY_CARE_PROVIDER_SITE_OTHER): Payer: PPO | Admitting: Family Medicine

## 2020-10-30 ENCOUNTER — Other Ambulatory Visit: Payer: Self-pay

## 2020-10-30 ENCOUNTER — Telehealth: Payer: Self-pay | Admitting: Nurse Practitioner

## 2020-10-30 VITALS — BP 124/84 | HR 64 | Temp 98.6°F | Resp 16 | Wt 187.2 lb

## 2020-10-30 DIAGNOSIS — H6983 Other specified disorders of Eustachian tube, bilateral: Secondary | ICD-10-CM

## 2020-10-30 MED ORDER — PREDNISONE 10 MG (21) PO TBPK: ORAL_TABLET | ORAL | 1 refills | Status: DC

## 2020-10-30 NOTE — Progress Notes (Signed)
Established Patient Office Visit  Subjective:  Patient ID: Monique Rangel, female    DOB: September 17, 1955  Age: 65 y.o. MRN: [id number]  CC:  Chief Complaint  Patient presents with   Acute Visit    Pt follow to make sure ear infection in both ears is gone, pt states she is done with prescribed amoxil and prednisone for issue. It is better, pt states she is still experiencing fullness pressure in both ears just not as frequent.     HPI Monique Rangel presents for follow-up of sinuses, rash on anterior neck and ear congestion.  Sinuses are much improved and rash has resolved after treatment with amoxicillin and brief course of prednisone.  Ears remain congested.  She has been afebrile.  She continues  Past Medical History:  Diagnosis Date   Anxiety 05/11/2016    History reviewed. No pertinent surgical history.  Family History  Problem Relation Age of Onset   Breast cancer Neg Hx     Social History   Socioeconomic History   Marital status: Married    Spouse name: Not on file   Number of children: 2   Years of education: Not on file   Highest education level: Not on file  Occupational History   Not on file  Tobacco Use   Smoking status: Never   Smokeless tobacco: Never  Substance and Sexual Activity   Alcohol use: Yes    Comment: Moderate   Drug use: Never   Sexual activity: Not Currently    Birth control/protection: Post-menopausal  Other Topics Concern   Not on file  Social History Narrative   Not on file   Social Determinants of Health   Financial Resource Strain: Not on file  Food Insecurity: Not on file  Transportation Needs: Not on file  Physical Activity: Not on file  Stress: Not on file  Social Connections: Not on file  Intimate Partner Violence: Not on file    Outpatient Medications Prior to Visit  Medication Sig Dispense Refill   albuterol (VENTOLIN HFA) 108 (90 Base) MCG/ACT inhaler Inhale 1-2 puffs into the lungs every 6 (six) hours as needed for  wheezing or shortness of breath. 8 g 0   diphenhydrAMINE (BENADRYL) 25 MG tablet Take 25 mg by mouth at bedtime as needed.     DULoxetine (CYMBALTA) 20 MG capsule TAKE ONE CAPSULE BY MOUTH DAILY 30 capsule 5   fluticasone (FLONASE) 50 MCG/ACT nasal spray Place 2 sprays into both nostrils every morning.     glucosamine-chondroitin 500-400 MG tablet Take 1 tablet by mouth 3 (three) times daily.     amoxicillin (AMOXIL) 875 MG tablet Take 1 tablet (875 mg total) by mouth 2 (two) times daily for 10 days. (Patient not taking: Reported on 10/30/2020) 20 tablet 0   No facility-administered medications prior to visit.    Allergies  Allergen Reactions   Lamisil [Terbinafine] Rash   Quinine Other (See Comments)    Mother had a bad reaction so does not want to use it Mother had a bad reaction so does not want to use it     ROS Review of Systems  Constitutional:  Negative for chills, diaphoresis, fatigue, fever and unexpected weight change.  HENT:  Positive for hearing loss. Negative for ear discharge, ear pain, postnasal drip, sinus pressure and sinus pain.   Respiratory: Negative.    Cardiovascular: Negative.   Gastrointestinal: Negative.   Skin:  Negative for rash.  Neurological:  Negative for headaches.  Objective:    Physical Exam Vitals and nursing note reviewed.  Constitutional:      General: She is not in acute distress.    Appearance: Normal appearance. She is not ill-appearing, toxic-appearing or diaphoretic.  HENT:     Head: Normocephalic and atraumatic.     Right Ear: Tympanic membrane is retracted. Tympanic membrane is not injected or erythematous.     Left Ear: Tympanic membrane is retracted. Tympanic membrane is not injected or erythematous.     Mouth/Throat:     Mouth: Mucous membranes are moist.     Pharynx: Oropharynx is clear. No oropharyngeal exudate or posterior oropharyngeal erythema.  Eyes:     General: No scleral icterus.       Right eye: No discharge.         Left eye: No discharge.     Extraocular Movements: Extraocular movements intact.     Conjunctiva/sclera: Conjunctivae normal.     Pupils: Pupils are equal, round, and reactive to light.  Cardiovascular:     Rate and Rhythm: Normal rate and regular rhythm.  Pulmonary:     Effort: Pulmonary effort is normal.     Breath sounds: Normal breath sounds.  Abdominal:     Palpations: There is no mass.  Musculoskeletal:     Cervical back: No rigidity or tenderness.  Lymphadenopathy:     Cervical: No cervical adenopathy.  Skin:    Findings: No rash.  Neurological:     Mental Status: She is alert and oriented to person, place, and time.  Psychiatric:        Mood and Affect: Mood normal.        Behavior: Behavior normal.    BP 124/84 (BP Location: Left Arm, Patient Position: Sitting, Cuff Size: Normal)   Pulse 64   Temp 98.6 F (37 C) (Oral)   Resp 16   Wt 187 lb 3.2 oz (84.9 kg)   SpO2 100%   BMI 30.21 kg/m  Wt Readings from Last 3 Encounters:  10/30/20 187 lb 3.2 oz (84.9 kg)  07/03/20 178 lb (80.7 kg)  05/12/20 177 lb 9.6 oz (80.6 kg)     Health Maintenance Due  Topic Date Due   TETANUS/TDAP  Never done   Zoster Vaccines- Shingrix (1 of 2) Never done   PAP SMEAR-Modifier  07/30/2019   DEXA SCAN  Never done   PNA vac Low Risk Adult (1 of 2 - PCV13) Never done   COVID-19 Vaccine (5 - Booster for Pfizer series) 10/21/2020   INFLUENZA VACCINE  10/12/2020    There are no preventive care reminders to display for this patient.  Lab Results  Component Value Date   TSH 1.69 04/14/2020   Lab Results  Component Value Date   WBC 5.9 04/14/2020   HGB 13.7 04/14/2020   HCT 40.9 04/14/2020   MCV 88.6 04/14/2020   PLT 239.0 04/14/2020   Lab Results  Component Value Date   NA 139 04/14/2020   K 4.3 04/14/2020   CO2 29 04/14/2020   GLUCOSE 106 (H) 04/14/2020   BUN 17 04/14/2020   CREATININE 0.69 04/14/2020   BILITOT 1.0 05/12/2020   ALKPHOS 72 05/12/2020   AST 18  05/12/2020   ALT 12 05/12/2020   PROT 6.8 05/12/2020   ALBUMIN 4.4 05/12/2020   CALCIUM 9.3 04/14/2020   GFR 91.27 04/14/2020   Lab Results  Component Value Date   CHOL 227 (H) 04/14/2020   Lab Results  Component Value Date  HDL 76.60 04/14/2020   Lab Results  Component Value Date   LDLCALC 125 (H) 04/14/2020   Lab Results  Component Value Date   TRIG 127.0 04/14/2020   Lab Results  Component Value Date   CHOLHDL 3 04/14/2020   No results found for: HGBA1C    Assessment & Plan:   Problem List Items Addressed This Visit       Nervous and Auditory   Dysfunction of both eustachian tubes - Primary   Relevant Medications   predniSONE (STERAPRED UNI-PAK 21 TAB) 10 MG (21) TBPK tablet    Meds ordered this encounter  Medications   predniSONE (STERAPRED UNI-PAK 21 TAB) 10 MG (21) TBPK tablet    Sig: Take 6 today, 5 tomorrow, 4 the next day and then 3, 2, 1 and stop    Dispense:  21 tablet    Refill:  1    Follow-up: Return Eustachian tube exercises 3-5 times daily..   Continue Flonase daily.  Follow-up as needed Libby Maw, MD

## 2020-10-30 NOTE — Telephone Encounter (Signed)
What is the name of the medication? DULoxetine (CYMBALTA) 20 MG capsule [id number]   Have you contacted your pharmacy to request a refill? Pt is wanting a refill on this, because [facility] is saying [facility] do not have this script.  Which pharmacy would you like this sent to? Pharmacy  [facility] [id number] [place] [place]  [address] [place] [address]  Phone:  [phone]  Fax:  [phone]      Patient notified that their request is being sent to the clinical staff for review and that they should receive a call once it is complete. If they do not receive a call within 72 hours they can check with their pharmacy or our office.

## 2020-11-06 NOTE — Telephone Encounter (Signed)
Medication sent to pharmacy on 10/27/20

## 2020-11-26 ENCOUNTER — Ambulatory Visit (INDEPENDENT_AMBULATORY_CARE_PROVIDER_SITE_OTHER): Payer: PPO | Admitting: Family Medicine

## 2020-11-26 ENCOUNTER — Other Ambulatory Visit: Payer: Self-pay | Admitting: Family Medicine

## 2020-11-26 ENCOUNTER — Ambulatory Visit (INDEPENDENT_AMBULATORY_CARE_PROVIDER_SITE_OTHER): Payer: PPO

## 2020-11-26 ENCOUNTER — Encounter: Payer: Self-pay | Admitting: Family Medicine

## 2020-11-26 ENCOUNTER — Other Ambulatory Visit: Payer: Self-pay

## 2020-11-26 VITALS — BP 118/74 | HR 81 | Temp 97.3°F | Ht 66.0 in | Wt 184.6 lb

## 2020-11-26 DIAGNOSIS — H11421 Conjunctival edema, right eye: Secondary | ICD-10-CM | POA: Diagnosis not present

## 2020-11-26 DIAGNOSIS — Z87828 Personal history of other (healed) physical injury and trauma: Secondary | ICD-10-CM | POA: Diagnosis not present

## 2020-11-26 DIAGNOSIS — H109 Unspecified conjunctivitis: Secondary | ICD-10-CM | POA: Diagnosis not present

## 2020-11-26 DIAGNOSIS — M1712 Unilateral primary osteoarthritis, left knee: Secondary | ICD-10-CM | POA: Diagnosis not present

## 2020-11-26 MED ORDER — DICLOFENAC SODIUM 1 % EX GEL: CUTANEOUS | 1 refills | Status: DC

## 2020-11-26 MED ORDER — GENTAMICIN SULFATE 0.3 % OP SOLN: 2.0000 [drp] | Freq: Three times a day (TID) | OPHTHALMIC | 0 refills | Status: AC

## 2020-11-26 NOTE — Progress Notes (Signed)
Established Patient Office Visit  Subjective:  Patient ID: Monique Rangel, female    DOB: 11-23-1955  Age: 65 y.o. MRN: [id number]  CC:  Chief Complaint  Patient presents with   Eye Problem    Right eye swollen feels as if there is something gritty in eye noticed last night. Little irritated/itchy.    HPI Monique Rangel presents for a couple of problems.  She has a 1 day history of antegradely irritated feeling in OD.  It is watering and injected.  There was no injury.  Denies headache or light sensitivity.  Eyelashes were not matted together.  Vision is not affected.  Reports right knee pain after stooping to pick something up.  Knee was swollen but that is resolved.  Longstanding history of instability in the right knee after a significant injury many years ago.  She believes that there was a meniscus injury at that time.  X-rays taken a few years ago did show tricompartmental degenerative changes.  She continues to play tennis and work in her garden.  Past Medical History:  Diagnosis Date   Anxiety 05/11/2016    No past surgical history on file.  Family History  Problem Relation Age of Onset   Breast cancer Neg Hx     Social History   Socioeconomic History   Marital status: Married    Spouse name: Not on file   Number of children: 2   Years of education: Not on file   Highest education level: Not on file  Occupational History   Not on file  Tobacco Use   Smoking status: Never   Smokeless tobacco: Never  Substance and Sexual Activity   Alcohol use: Yes    Comment: Moderate   Drug use: Never   Sexual activity: Not Currently    Birth control/protection: Post-menopausal  Other Topics Concern   Not on file  Social History Narrative   Not on file   Social Determinants of Health   Financial Resource Strain: Not on file  Food Insecurity: Not on file  Transportation Needs: Not on file  Physical Activity: Not on file  Stress: Not on file  Social Connections: Not on  file  Intimate Partner Violence: Not on file    Outpatient Medications Prior to Visit  Medication Sig Dispense Refill   albuterol (VENTOLIN HFA) 108 (90 Base) MCG/ACT inhaler Inhale 1-2 puffs into the lungs every 6 (six) hours as needed for wheezing or shortness of breath. 8 g 0   diphenhydrAMINE (BENADRYL) 25 MG tablet Take 25 mg by mouth at bedtime as needed.     DULoxetine (CYMBALTA) 20 MG capsule TAKE ONE CAPSULE BY MOUTH DAILY 30 capsule 5   fluticasone (FLONASE) 50 MCG/ACT nasal spray Place 2 sprays into both nostrils every morning.     glucosamine-chondroitin 500-400 MG tablet Take 1 tablet by mouth 3 (three) times daily.     predniSONE (STERAPRED UNI-PAK 21 TAB) 10 MG (21) TBPK tablet Take 6 today, 5 tomorrow, 4 the next day and then 3, 2, 1 and stop 21 tablet 1   No facility-administered medications prior to visit.    Allergies  Allergen Reactions   Lamisil [Terbinafine] Rash   Quinine Other (See Comments)    Mother had a bad reaction so does not want to use it Mother had a bad reaction so does not want to use it     ROS Review of Systems  Constitutional: Negative.   HENT: Negative.    Eyes:  Positive for discharge, redness and itching. Negative for photophobia, pain and visual disturbance.  Respiratory: Negative.    Cardiovascular: Negative.   Musculoskeletal:  Positive for arthralgias and gait problem.  Psychiatric/Behavioral: Negative.       Objective:    Physical Exam Vitals and nursing note reviewed.  Constitutional:      General: She is not in acute distress.    Appearance: Normal appearance. She is not ill-appearing, toxic-appearing or diaphoretic.  HENT:     Head: Normocephalic and atraumatic.     Right Ear: External ear normal.     Left Ear: External ear normal.  Eyes:     General: Lids are normal. Lids are everted, no foreign bodies appreciated. Gaze aligned appropriately.        Right eye: No foreign body or hordeolum.        Left eye: No foreign  body or hordeolum.     Extraocular Movements: Extraocular movements intact.     Conjunctiva/sclera:     Right eye: Right conjunctiva is injected. Chemosis present. No exudate or hemorrhage.  Cardiovascular:     Rate and Rhythm: Normal rate and regular rhythm.  Pulmonary:     Effort: Pulmonary effort is normal.     Breath sounds: Normal breath sounds.  Neurological:     Mental Status: She is alert.    BP 118/74 (BP Location: Right Arm, Patient Position: Sitting, Cuff Size: Normal)   Pulse 81   Temp (!) 97.3 F (36.3 C) (Temporal)   Ht '5\' 6"'$  (1.676 m)   Wt 184 lb 9.6 oz (83.7 kg)   SpO2 96%   BMI 29.80 kg/m  Wt Readings from Last 3 Encounters:  11/26/20 184 lb 9.6 oz (83.7 kg)  10/30/20 187 lb 3.2 oz (84.9 kg)  07/03/20 178 lb (80.7 kg)     Health Maintenance Due  Topic Date Due   TETANUS/TDAP  Never done   Zoster Vaccines- Shingrix (1 of 2) Never done   PAP SMEAR-Modifier  07/30/2019   DEXA SCAN  Never done   PNA vac Low Risk Adult (1 of 2 - PCV13) Never done    There are no preventive care reminders to display for this patient.  Lab Results  Component Value Date   TSH 1.69 04/14/2020   Lab Results  Component Value Date   WBC 5.9 04/14/2020   HGB 13.7 04/14/2020   HCT 40.9 04/14/2020   MCV 88.6 04/14/2020   PLT 239.0 04/14/2020   Lab Results  Component Value Date   NA 139 04/14/2020   K 4.3 04/14/2020   CO2 29 04/14/2020   GLUCOSE 106 (H) 04/14/2020   BUN 17 04/14/2020   CREATININE 0.69 04/14/2020   BILITOT 1.0 05/12/2020   ALKPHOS 72 05/12/2020   AST 18 05/12/2020   ALT 12 05/12/2020   PROT 6.8 05/12/2020   ALBUMIN 4.4 05/12/2020   CALCIUM 9.3 04/14/2020   GFR 91.27 04/14/2020   Lab Results  Component Value Date   CHOL 227 (H) 04/14/2020   Lab Results  Component Value Date   HDL 76.60 04/14/2020   Lab Results  Component Value Date   LDLCALC 125 (H) 04/14/2020   Lab Results  Component Value Date   TRIG 127.0 04/14/2020   Lab Results   Component Value Date   CHOLHDL 3 04/14/2020   No results found for: HGBA1C    Assessment & Plan:   Problem List Items Addressed This Visit       Musculoskeletal  and Integument   Primary osteoarthritis of left knee - Primary   Relevant Medications   diclofenac Sodium (VOLTAREN) 1 % GEL   Other Relevant Orders   Ambulatory referral to Sports Medicine   DG Knee Complete 4 Views Right     Other   Chemosis of conjunctiva subconjunctival edema, right   Conjunctivitis of right eye   Relevant Medications   gentamicin (GARAMYCIN) 0.3 % ophthalmic solution   History of torn meniscus of left knee   Relevant Orders   Ambulatory referral to Sports Medicine   DG Knee Complete 4 Views Right    Meds ordered this encounter  Medications   gentamicin (GARAMYCIN) 0.3 % ophthalmic solution    Sig: Place 2 drops into the right eye 3 (three) times daily for 5 days.    Dispense:  5 mL    Refill:  0   diclofenac Sodium (VOLTAREN) 1 % GEL    Sig: Apply a small grape sized amount to right knee every 6 hours as needed.    Dispense:  150 g    Refill:  1    Follow-up: Return Call monday if eye is not doing better.Marland Kitchen  Sports medicine referral for advice significant osteoarthritis of knee and consideration of offloading brace.  Call on Monday advised not improved.  Libby Maw, MD

## 2020-11-30 ENCOUNTER — Telehealth: Payer: Self-pay | Admitting: Family Medicine

## 2020-11-30 ENCOUNTER — Encounter: Payer: Self-pay | Admitting: Family Medicine

## 2020-12-02 ENCOUNTER — Ambulatory Visit: Payer: PPO | Admitting: Family Medicine

## 2020-12-02 ENCOUNTER — Other Ambulatory Visit: Payer: Self-pay | Admitting: Family Medicine

## 2020-12-02 ENCOUNTER — Ambulatory Visit: Payer: Self-pay

## 2020-12-02 VITALS — Ht 66.0 in | Wt 184.0 lb

## 2020-12-02 DIAGNOSIS — M1712 Unilateral primary osteoarthritis, left knee: Secondary | ICD-10-CM

## 2020-12-02 DIAGNOSIS — G8929 Other chronic pain: Secondary | ICD-10-CM

## 2020-12-02 DIAGNOSIS — H109 Unspecified conjunctivitis: Secondary | ICD-10-CM

## 2020-12-02 DIAGNOSIS — M25562 Pain in left knee: Secondary | ICD-10-CM | POA: Diagnosis not present

## 2020-12-02 DIAGNOSIS — H11421 Conjunctival edema, right eye: Secondary | ICD-10-CM

## 2020-12-02 MED ORDER — METHYLPREDNISOLONE ACETATE 40 MG/ML IJ SUSP
40.0000 mg | Freq: Once | INTRAMUSCULAR | Status: AC
  Administered 2020-12-02: 40 mg via INTRA_ARTICULAR

## 2020-12-02 MED ORDER — PATADAY 0.7 % OP SOLN: 1.0000 [drp] | Freq: Every day | OPHTHALMIC | 1 refills | Status: DC

## 2020-12-02 NOTE — Progress Notes (Signed)
Monique Rangel is a 65 y.o. female who presents to [facility] today for the following:  Left knee pain Acute on chronic 10 days ago she was bending down and kneeling on her knees for about 10 minutes After this she was feeling some pain in her left knee, then went to walk and had a very sharp pain in the lateral joint line of her left knee She states that she is usually very active, plays tennis, walks, works in her garden Occasionally when she has pain and stiffness in her left knee she will ice and use Advil which will help with the pain She states that this pain has lasted much longer than usual She continues to have occasional sharp pain in her left lateral joint line when she is walking She has had some swelling She has tried to use a compression sleeve, but pain has continued to occur and she continues at the swelling She saw her primary care provider last week and had x-rays at that time that showed mild-moderate tricompartment osteoarthritis She is feels that it is unstable and She does report a fall about 10 years ago in which she injured her left knee, possibly had a meniscus tear at that time, did not have surgery for this   PMH reviewed.  ROS as above. Medications reviewed.  Exam:  Ht 5\' 6"  (1.676 m)   Wt 184 lb (83.5 kg)   BMI 29.70 kg/m  Gen: Well NAD MSK:  Left knee: - Inspection: no gross deformity b/l.  She has mild effusion of the left knee without erythema or bruising.  Skin intact - Palpation: Tenderness palpation is along her lateral joint line. - ROM: full active ROM with flexion and extension in knee and hip b/l.  She does have some tightness with the extreme of flexion on the left. - Strength: 5/5 strength b/l - Neuro/vasc: NV intact distally b/l - Special Tests: - LIGAMENTS: negative anterior and posterior drawer, negative Lachman's, no MCL or LCL laxity  -- MENISCUS: Positive McMurray's on left -- PF JOINT: nml patellar mobility bilaterally.  negative  patellar apprehension  Hips: normal ROM   X-rays from her primary care provider were reviewed by me today Shows some mild to moderate tricompartmental osteoarthritis and small effusion   Assessment and Plan: 1) Left knee pain Suspect that she has worsened a previous lateral meniscus tear versus now has a new degenerative meniscus tear.  Discussed with patient oral anti-inflammatories with physical therapy, corticosteroid injection with physical therapy.  She opts to perform corticosteroid injection today, risk and benefits discussed and she opted to proceed.  She tolerated the procedure well.  Advised that if over the next week she is not having improvement in her pain, could start taking Advil again for pain or could contact me for prescription for meloxicam.  She was sent to formal physical therapy for this.  We will see her back in 6 to 8 weeks to ensure that she is having improvement in her pain.  If she is not, would consider an MRI.  Procedure performed: knee intraarticular corticosteroid injection; palpation guided  Consent obtained and verified. Time-out conducted. Noted no overlying erythema, induration, or other signs of local infection. The left lateral joint space was palpated and marked. The overlying skin was prepped in a sterile fashion. Topical analgesic spray: Ethyl chloride. Joint: left knee Needle: 25 gauge, 1.5 inch Completed without difficulty. Meds: 40 mg methylrprednisolone, 3 ml 1% lidocaine without epinephrine  Advised to call  if fevers/chills, erythema, induration, drainage, or persistent bleeding.  Arizona Constable, D.O.  PGY-4 [facility]  12/02/2020 5:15 PM  Addendum:  Patient seen in the office by fellow.  Her history, exam, plan of care were precepted with me.  Karlton Lemon MD [facility]irt Boys

## 2020-12-02 NOTE — Assessment & Plan Note (Addendum)
Suspect that she has worsened a previous lateral meniscus tear versus now has a new degenerative meniscus tear.  Discussed with patient oral anti-inflammatories with physical therapy, corticosteroid injection with physical therapy.  She opts to perform corticosteroid injection today, risk and benefits discussed and she opted to proceed.  She tolerated the procedure well.  Advised that if over the next week she is not having improvement in her pain, could start taking Advil again for pain or could contact me for prescription for meloxicam.  She was sent to formal physical therapy for this.  We will see her back in 6 to 8 weeks to ensure that she is having improvement in her pain.  If she is not, would consider an MRI.  Procedure performed: knee intraarticular corticosteroid injection; palpation guided  Consent obtained and verified. Time-out conducted. Noted no overlying erythema, induration, or other signs of local infection. The left lateral joint space was palpated and marked. The overlying skin was prepped in a sterile fashion. Topical analgesic spray: Ethyl chloride. Joint: left knee Needle: 25 gauge, 1.5 inch Completed without difficulty. Meds: 40 mg methylrprednisolone, 3 ml 1% lidocaine without epinephrine  Advised to call if fevers/chills, erythema, induration, drainage, or persistent bleeding.

## 2020-12-02 NOTE — Patient Instructions (Signed)
Thank you for coming to see me today. It was a pleasure. Today we talked about:   Today you received an injection with corticosteroid. This injection is usually done in response to pain and inflammation. There is some "numbing" medicine also in the shot so the injected area may be numb and feel really good for the next couple of hours. The numbing medicine usually wears off in 2-3 hours though, and then your pain level will be right back where it was before the injection.   The actually benefit from the steroid injection is usually noticed in 2-7 days. You may actually experience a small (as in 10%) INCREASE in pain in the first 24 hours---that is common.   Things to watch out for that you should contact us or a health care provider urgently would include: 1. Unusual (as in more than 10%) increase in pain 2. New fever > 101.5 3. New swelling or redness of the injected area.  4. Streaking of red lines around the area injected.   We will send you to formal physical therapy for your knee as well.  Please follow-up with [place] in 6 weeks.  If you have any questions or concerns, please do not hesitate to call the office at [phone].  Best,   Arizona Constable, DO [place]

## 2020-12-15 ENCOUNTER — Other Ambulatory Visit: Payer: Self-pay

## 2020-12-15 ENCOUNTER — Encounter: Payer: Self-pay | Admitting: Physical Therapy

## 2020-12-15 ENCOUNTER — Ambulatory Visit: Payer: PPO | Attending: Family Medicine | Admitting: Physical Therapy

## 2020-12-15 DIAGNOSIS — M25561 Pain in right knee: Secondary | ICD-10-CM | POA: Insufficient documentation

## 2020-12-15 DIAGNOSIS — M25562 Pain in left knee: Secondary | ICD-10-CM | POA: Insufficient documentation

## 2020-12-15 DIAGNOSIS — R262 Difficulty in walking, not elsewhere classified: Secondary | ICD-10-CM | POA: Diagnosis not present

## 2020-12-15 NOTE — Therapy (Signed)
[address] [place], [place], [address] Phone: [phone]   Fax:  [phone]  Physical Therapy Evaluation  Patient Details  Name: Monique Rangel MRN: [id number] Date of Birth: 10/18/1955 Referring Provider (PT): [place]   Encounter Date: 12/15/2020   PT End of Session - 12/15/20 0921     Visit Number 1    Date for PT Re-Evaluation 03/13/21    PT Start Time 0844    PT Stop Time 0925    PT Time Calculation (min) 41 min    Activity Tolerance Patient tolerated treatment well    Behavior During Therapy [facility] for tasks assessed/performed             Past Medical History:  Diagnosis Date   Anxiety 05/11/2016    History reviewed. No pertinent surgical history.  There were no vitals filed for this visit.    Subjective Assessment - 12/15/20 0849     Subjective Patient reports that she has had some knee pain for a number of years but usually doing well, gardens, plays tennis, reports that about 3 weeks ago she was doing a lot of kneeling and really started having left knee pain that night, she reports really difficulty walking and she went to the MD, has OA in the joint, had an injection in the left and has gotten better.    Limitations House hold activities;Walking    Patient Stated Goals have less pain, more strength, play tennis    Currently in Pain? Yes    Pain Score 1     Pain Location Knee   left > right   Pain Orientation Left    Pain Descriptors / Indicators Sore;Aching;Sharp    Pain Onset 1 to 4 weeks ago    Pain Frequency Intermittent    Aggravating Factors  kneeling, playing tennis, walking, pain up to 9/10    Pain Relieving Factors cortisone injection has really helped, now mostly no pain    Effect of Pain on Daily Activities diff walking dog and playing tennis, yardwork                [facility] PT Assessment - 12/15/20 0001       Assessment   Medical Diagnosis knee pain with OA     Referring Provider (PT) Meccariello    Onset Date/Surgical Date 11/24/20    Prior Therapy for neck      Precautions   Precautions None      Balance Screen   Has the patient fallen in the past 6 months No    Has the patient had a decrease in activity level because of a fear of falling?  No    Is the patient reluctant to leave their home because of a fear of falling?  No      Home Environment   Additional Comments no stairs, lives with husband and 3 year old mom      Prior Function   Level of Independence Independent    Vocation Part time employment    Vocation Requirements 2x/week substitute teaching    Leisure tennis 1x/week, walks 2x /week      AROM   AROM Assessment Site Knee    Right/Left Knee Right;Left    Right Knee Extension 0    Right Knee Flexion 110    Left Knee Extension 0    Left Knee Flexion 108      Strength   Overall Strength Comments 4+/5  Flexibility   Hamstrings mild tightness    Quadriceps tight    ITB tight iwht some pain    Piriformis tight with some pain      Palpation   Palpation comment mild lateral tracking patella      Ambulation/Gait   Gait Comments slight antalgic gait on the left                        Objective measurements completed on examination: See above findings.                  PT Short Term Goals - 12/15/20 0925       PT SHORT TERM GOAL #1   Title independent with iniital HEP    Time 2    Period Weeks    Status New               PT Long Term Goals - 12/15/20 9794       PT LONG TERM GOAL #1   Title understand posture and body mechanics    Time 12    Period Weeks    Status New      PT LONG TERM GOAL #2   Title decreease pain 50%    Time 12    Period Weeks    Status New      PT LONG TERM GOAL #3   Title increase knee flexion to 120 degrees    Time 12    Period Weeks    Status New      PT LONG TERM GOAL #4   Title report 25% less knee pain with walking    Time 12     Period Weeks    Status New      PT LONG TERM GOAL #5   Title go up and down stairs without pain    Time 12    Period Weeks                    Plan - 12/15/20 8016     Clinical Impression Statement Patient reports that 3 weeks ago she was kneeling a lot and started having some significant right knee pain that night, has had some left knee pain as well.  She had x-rays that showed OA, she had a cortisone injection that has really helped her pain, upon exam she has some limitation in knee flexion with some pain, good strength, has tight HS, calf, hip flexor, ITB and piriformis with some pain.  Has a limp on the right and has some difficulty with stairs.  She is very active walking dogs, gardening and playing tennis.  Has some swelling posterior left knee and is very tender in the left hip flexor    Stability/Clinical Decision Making Stable/Uncomplicated    Clinical Decision Making Low    Rehab Potential Good    PT Frequency 2x / week    PT Duration 12 weeks    PT Treatment/Interventions ADLs/Self Care Home Management;Cryotherapy;Electrical Stimulation;Moist Heat;Stair training;Functional mobility training;Therapeutic activities;Therapeutic exercise;Balance training;Neuromuscular re-education;Manual techniques;Patient/family education;Dry needling;Iontophoresis 4mg /ml Dexamethasone;Gait training    PT Next Visit Plan assure HEP and start some general strength, is very tender in the left hip flexor    Consulted and Agree with Plan of Care Patient             Patient will benefit from skilled therapeutic intervention in order to improve the following deficits and impairments:  Abnormal gait, Difficulty walking, Decreased  range of motion, Increased muscle spasms, Decreased activity tolerance, Pain, Improper body mechanics, Postural dysfunction, Decreased strength, Decreased balance  Visit Diagnosis: Acute pain of left knee - Plan: PT plan of care cert/re-cert  Acute pain of  right knee - Plan: PT plan of care cert/re-cert  Difficulty in walking, not elsewhere classified - Plan: PT plan of care cert/re-cert     Problem List Patient Active Problem List   Diagnosis Date Noted   Chemosis of conjunctiva subconjunctival edema, right 11/26/2020   Conjunctivitis of right eye 11/26/2020   History of torn meniscus of left knee 11/26/2020   Primary osteoarthritis of left knee 11/26/2020   Dysfunction of both eustachian tubes 10/30/2020   Chronic pain of both knees 05/12/2020   Onychomycosis 05/12/2020   GAD (generalized anxiety disorder) 04/14/2020   Asymptomatic postmenopausal estrogen deficiency 04/14/2020   DDD (degenerative disc disease), cervical 04/14/2020   Migraine 04/14/2020   Macular degeneration, bilateral 04/14/2020   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 [place], [place], [address] Phone: [phone]   Fax:  [phone]  Name: Monique Rangel MRN: [id number] Date of Birth: 1956-02-13

## 2020-12-15 NOTE — Patient Instructions (Signed)
Access Code: [email or web address] URL: [email or web address] Date: 12/15/2020 Prepared by: Lum Babe  Exercises Seated Hamstring Stretch with Chair - 2 x daily - 7 x weekly - 1 sets - 10 reps - 30 hold Supine Piriformis Stretch Pulling Heel to Hip - 2 x daily - 7 x weekly - 1 sets - 10 reps - 30 hold Supine ITB Stretch with Strap - 2 x daily - 7 x weekly - 1 sets - 10 reps - 30 hold Slant Board Gastrocnemius Stretch - 2 x daily - 7 x weekly - 1 sets - 10 reps - 30 hold Supine Quadriceps Stretch with Strap on Table - 2 x daily - 7 x weekly - 1 sets - 5 reps - 30 hold

## 2020-12-22 ENCOUNTER — Ambulatory Visit: Payer: PPO | Admitting: Physical Therapy

## 2020-12-29 ENCOUNTER — Other Ambulatory Visit: Payer: Self-pay

## 2020-12-29 ENCOUNTER — Ambulatory Visit: Payer: PPO | Admitting: Physical Therapy

## 2020-12-29 DIAGNOSIS — M25561 Pain in right knee: Secondary | ICD-10-CM

## 2020-12-29 DIAGNOSIS — R262 Difficulty in walking, not elsewhere classified: Secondary | ICD-10-CM

## 2020-12-29 DIAGNOSIS — M25562 Pain in left knee: Secondary | ICD-10-CM | POA: Diagnosis not present

## 2020-12-29 NOTE — Therapy (Signed)
[place] [place], [place], [address] Phone: [phone]   Fax:  [phone]  Physical Therapy Treatment  Patient Details  Name: Monique Rangel MRN: [id number] Date of Birth: September 01, 1955 Referring Provider (PT): Meccariello   Encounter Date: 12/29/2020   PT End of Session - 12/29/20 0954     Visit Number 2    Date for PT Re-Evaluation 03/13/21    PT Start Time 0973    PT Stop Time 1008    PT Time Calculation (min) 43 min             Past Medical History:  Diagnosis Date   Anxiety 05/11/2016    No past surgical history on file.  There were no vitals filed for this visit.   Subjective Assessment - 12/29/20 0931     Subjective I feel like cortisone has already starting to wear off. pain worse at end of day or after activity    Currently in Pain? Yes    Pain Score 3     Pain Location Knee    Pain Orientation Left                               [facility] Adult PT Treatment/Exercise - 12/29/20 0001       Knee/Hip Exercises: Aerobic   Nustep L 5 57mn      Knee/Hip Exercises: Machines for Strengthening   Cybex Knee Extension 5# 2 sets 10 each. 15# BIL 10 x    Cybex Knee Flexion 20# BIL, 10 # 10 x SL      Knee/Hip Exercises: Standing   Other Standing Knee Exercises hip cable pulleys 4 way BIL 10 # plus SLR with ER      Knee/Hip Exercises: Seated   Other Seated Knee/Hip Exercises ball squeeze 10 x hold 3 sec and ball squeeze with LAQ 10 each      Manual Therapy   Manual Therapy Passive ROM    Passive ROM BIL LE                     PT Education - 12/29/20 0952     Education Details reprinted stretches from eval for pt and added LAQ with VMO ball squeeze    Person(s) Educated Patient    Methods Explanation;Demonstration;Handout    Comprehension Verbalized understanding;Returned demonstration              PT Short Term Goals - 12/29/20 0954       PT SHORT  TERM GOAL #1   Title independent with iniital HEP    Status Achieved               PT Long Term Goals - 12/15/20 0925       PT LONG TERM GOAL #1   Title understand posture and body mechanics    Time 12    Period Weeks    Status New      PT LONG TERM GOAL #2   Title decreease pain 50%    Time 12    Period Weeks    Status New      PT LONG TERM GOAL #3   Title increase knee flexion to 120 degrees    Time 12    Period Weeks    Status New      PT LONG TERM GOAL #4   Title report 25% less knee pain  with walking    Time 12    Period Weeks    Status New      PT LONG TERM GOAL #5   Title go up and down stairs without pain    Time 12    Period Weeks                   Plan - 12/29/20 0954     Clinical Impression Statement STG met. Tolerated initila ther ex progression well, noted quad fatigue esp with VMO activtaion. Cuing for speed and control. Tightness noted with stretching. Explained to pt how strteching and correct muscles activation help with OA to support joint.    PT Treatment/Interventions ADLs/Self Care Home Management;Cryotherapy;Electrical Stimulation;Moist Heat;Stair training;Functional mobility training;Therapeutic activities;Therapeutic exercise;Balance training;Neuromuscular re-education;Manual techniques;Patient/family education;Dry needling;Iontophoresis 73m/ml Dexamethasone;Gait training    PT Next Visit Plan assess and progress             Patient will benefit from skilled therapeutic intervention in order to improve the following deficits and impairments:  Abnormal gait, Difficulty walking, Decreased range of motion, Increased muscle spasms, Decreased activity tolerance, Pain, Improper body mechanics, Postural dysfunction, Decreased strength, Decreased balance  Visit Diagnosis: Acute pain of left knee  Acute pain of right knee  Difficulty in walking, not elsewhere classified     Problem List Patient Active Problem List    Diagnosis Date Noted   Chemosis of conjunctiva subconjunctival edema, right 11/26/2020   Conjunctivitis of right eye 11/26/2020   History of torn meniscus of left knee 11/26/2020   Primary osteoarthritis of left knee 11/26/2020   Dysfunction of both eustachian tubes 10/30/2020   Chronic pain of both knees 05/12/2020   Onychomycosis 05/12/2020   GAD (generalized anxiety disorder) 04/14/2020   Asymptomatic postmenopausal estrogen deficiency 04/14/2020   DDD (degenerative disc disease), cervical 04/14/2020   Migraine 04/14/2020   Macular degeneration, bilateral 04/14/2020   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

## 2021-01-12 ENCOUNTER — Other Ambulatory Visit: Payer: Self-pay

## 2021-01-12 ENCOUNTER — Ambulatory Visit
Admission: RE | Admit: 2021-01-12 | Discharge: 2021-01-12 | Disposition: A | Discharge status: Home / Self Care | Payer: PPO | Source: Ambulatory Visit | Attending: Nurse Practitioner | Admitting: Nurse Practitioner

## 2021-01-12 DIAGNOSIS — Z0000 Encounter for general adult medical examination without abnormal findings: Secondary | ICD-10-CM

## 2021-01-12 DIAGNOSIS — Z780 Asymptomatic menopausal state: Secondary | ICD-10-CM

## 2021-01-14 ENCOUNTER — Ambulatory Visit: Payer: PPO | Attending: Family Medicine | Admitting: Physical Therapy

## 2021-01-14 ENCOUNTER — Other Ambulatory Visit: Payer: Self-pay

## 2021-01-14 DIAGNOSIS — R262 Difficulty in walking, not elsewhere classified: Secondary | ICD-10-CM | POA: Insufficient documentation

## 2021-01-14 DIAGNOSIS — M25562 Pain in left knee: Secondary | ICD-10-CM | POA: Insufficient documentation

## 2021-01-14 DIAGNOSIS — M25561 Pain in right knee: Secondary | ICD-10-CM | POA: Diagnosis not present

## 2021-01-14 NOTE — Therapy (Signed)
[place]. [place], [place], [address] Phone: [phone]   Fax:  [phone]  Physical Therapy Treatment  Patient Details  Name: Monique Rangel MRN: [id number] Date of Birth: [birth year] Referring Provider (PT): Meccariello   Encounter Date: 01/14/2021   PT End of Session - 01/14/21 1435     Visit Number 3    Date for PT Re-Evaluation 03/13/21    PT Start Time 6945    PT Stop Time 0388    PT Time Calculation (min) 42 min             Past Medical History:  Diagnosis Date   Anxiety 05/11/2016    No past surgical history on file.  There were no vitals filed for this visit.   Subjective Assessment - 01/14/21 1357     Subjective knee is better- doing ex    Currently in Pain? No/denies                               [facility] [place] Adult PT Treatment/Exercise - 01/14/21 0001       Exercises   Exercises Knee/Hip      Knee/Hip Exercises: Aerobic   Nustep L 5 85mn      Knee/Hip Exercises: Machines for Strengthening   Cybex Knee Extension 5# 2 sets 10 each. 15# BIL 10 x    Cybex Knee Flexion 20# BIL, 10 # 10 x SL      Knee/Hip Exercises: Standing   Other Standing Knee Exercises hip cable pulleys 4 way BIL 15 # plus SLR with ER 15 BIL      Knee/Hip Exercises: Seated   Sit to Sand 3 sets;5 reps;without UE support   tband around knees     Knee/Hip Exercises: Supine   Straight Leg Raises Strengthening;Both;10 reps;3 sets   red tband, 2nd set with ER, 3 rd set with abd     Manual Therapy   Manual Therapy Passive ROM    Passive ROM BIL LE                       PT Short Term Goals - 12/29/20 0954       PT SHORT TERM GOAL #1   Title independent with iniital HEP    Status Achieved               PT Long Term Goals - 01/14/21 1427       PT LONG TERM GOAL #1   Title understand posture and body mechanics    Status Partially Met      PT LONG TERM GOAL #2   Title  decreease pain 50%    Status Partially Met      PT LONG TERM GOAL #3   Title increase knee flexion to 120 degrees    Status Achieved      PT LONG TERM GOAL #4   Title report 25% less knee pain with walking    Status Achieved      PT LONG TERM GOAL #5   Title go up and down stairs without pain    Status Partially Met                   Plan - 01/14/21 1438     Clinical Impression Statement pt has had a significant decrease in pain but good and bad days still,overall pleased and has HEP .  Good ROM,strength and flexibility. goals partially met    PT Treatment/Interventions ADLs/Self Care Home Management;Cryotherapy;Electrical Stimulation;Moist Heat;Stair training;Functional mobility training;Therapeutic activities;Therapeutic exercise;Balance training;Neuromuscular re-education;Manual techniques;Patient/family education;Dry needling;Iontophoresis 64m/ml Dexamethasone;Gait training    PT Next Visit Plan HOLD as pt is very pleased with progress and wants to work with HEP             Patient will benefit from skilled therapeutic intervention in order to improve the following deficits and impairments:  Abnormal gait, Difficulty walking, Decreased range of motion, Increased muscle spasms, Decreased activity tolerance, Pain, Improper body mechanics, Postural dysfunction, Decreased strength, Decreased balance  Visit Diagnosis: Acute pain of left knee  Acute pain of right knee  Difficulty in walking, not elsewhere classified     Problem List Patient Active Problem List   Diagnosis Date Noted   Chemosis of conjunctiva subconjunctival edema, right 11/26/2020   Conjunctivitis of right eye 11/26/2020   History of torn meniscus of left knee 11/26/2020   Primary osteoarthritis of left knee 11/26/2020   Dysfunction of both eustachian tubes 10/30/2020   Chronic pain of both knees 05/12/2020   Onychomycosis 05/12/2020   GAD (generalized anxiety disorder) 04/14/2020    Asymptomatic postmenopausal estrogen deficiency 04/14/2020   DDD (degenerative disc disease), cervical 04/14/2020   Migraine 04/14/2020   Macular degeneration, bilateral 04/14/2020   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

## 2021-02-10 ENCOUNTER — Other Ambulatory Visit: Payer: Self-pay

## 2021-02-10 ENCOUNTER — Ambulatory Visit (INDEPENDENT_AMBULATORY_CARE_PROVIDER_SITE_OTHER): Payer: PPO | Admitting: Nurse Practitioner

## 2021-02-10 VITALS — BP 118/82 | HR 68 | Temp 98.2°F | Resp 12 | Ht 66.0 in | Wt 183.5 lb

## 2021-02-10 DIAGNOSIS — J069 Acute upper respiratory infection, unspecified: Secondary | ICD-10-CM | POA: Insufficient documentation

## 2021-02-10 NOTE — Progress Notes (Signed)
Acute Office Visit  Subjective:    Patient ID: Monique Rangel, female    DOB: Aug 15, 1955, 65 y.o.   MRN: [id number]  Chief Complaint  Patient presents with   Sinus Problem    Sx started around 02/05/21 with fever blister. Sinus headache, congestion, cold sore flare up, runny nose, sneezing, post nasal drainage. No fever.Covid negative on 02/08/21.    Sinus Problem Associated symptoms include coughing (dry), ear pain (pressure in the left ear), headaches (not today) and sinus pressure. Pertinent negatives include no chills, congestion, shortness of breath or sore throat.  Patient is in today for   Symptoms started on 02/05/2021  Cold sore came first that she treated with Abreva. States started feeling worse on Sunday. Started Advil cold and sinus ( ibuprofen and pseudoephedrine).  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  States she feels like the symptoms are improving but is suppose to have dinner guest on Saturday and suppose to go with a friend for surgery on Monday night  Past Medical History:  Diagnosis Date   Anxiety 05/11/2016    No past surgical history on file.  Family History  Problem Relation Age of Onset   Breast cancer Neg Hx     Social History   Socioeconomic History   Marital status: Married    Spouse name: Not on file   Number of children: 2   Years of education: Not on file   Highest education level: Not on file  Occupational History   Not on file  Tobacco Use   Smoking status: Never   Smokeless tobacco: Never  Substance and Sexual Activity   Alcohol use: Yes    Comment: Moderate   Drug use: Never   Sexual activity: Not Currently    Birth control/protection: Post-menopausal  Other Topics Concern   Not on file  Social History Narrative   Not on file   Social Determinants of Health   Financial Resource Strain: Not on  file  Food Insecurity: Not on file  Transportation Needs: Not on file  Physical Activity: Not on file  Stress: Not on file  Social Connections: Not on file  Intimate Partner Violence: Not on file    Outpatient Medications Prior to Visit  Medication Sig Dispense Refill   albuterol (VENTOLIN HFA) 108 (90 Base) MCG/ACT inhaler Inhale 1-2 puffs into the lungs every 6 (six) hours as needed for wheezing or shortness of breath. 8 g 0   diclofenac Sodium (VOLTAREN) 1 % GEL Apply a small grape sized amount to right knee every 6 hours as needed. 150 g 1   diphenhydrAMINE (BENADRYL) 25 MG tablet Take 25 mg by mouth at bedtime as needed.     DULoxetine (CYMBALTA) 20 MG capsule TAKE ONE CAPSULE BY MOUTH DAILY 30 capsule 5   fluticasone (FLONASE) 50 MCG/ACT nasal spray Place 2 sprays into both nostrils every morning.     glucosamine-chondroitin 500-400 MG tablet Take 1 tablet by mouth 3 (three) times daily.     Olopatadine HCl (PATADAY) 0.7 % SOLN Apply 1 drop to eye daily. 2.5 mL 1   No facility-administered medications prior to visit.    Allergies  Allergen Reactions   Lamisil [Terbinafine] Rash   Quinine Other (See Comments)    Mother had a bad reaction so does not want to use it Mother had a bad reaction so does not want to  use it     Review of Systems  Constitutional:  Negative for chills and fever.  HENT:  Positive for ear pain (pressure in the left ear), sinus pressure and sinus pain. Negative for congestion and sore throat.   Respiratory:  Positive for cough (dry). Negative for shortness of breath.   Cardiovascular:  Negative for chest pain.  Gastrointestinal:  Positive for vomiting (sunday night. Think it was food related). Negative for abdominal pain, diarrhea and nausea.  Musculoskeletal:  Negative for arthralgias and myalgias.  Neurological:  Positive for headaches (not today).      Objective:    Physical Exam Vitals and nursing note reviewed.  Constitutional:       Appearance: Normal appearance.  HENT:     Right Ear: Tympanic membrane, ear canal and external ear normal. There is no impacted cerumen.     Left Ear: Tympanic membrane, ear canal and external ear normal. There is no impacted cerumen.     Nose:     Right Sinus: No maxillary sinus tenderness or frontal sinus tenderness.     Left Sinus: No maxillary sinus tenderness or frontal sinus tenderness.     Mouth/Throat:     Mouth: Mucous membranes are moist.     Pharynx: Oropharynx is clear.  Cardiovascular:     Rate and Rhythm: Normal rate and regular rhythm.  Pulmonary:     Effort: Pulmonary effort is normal.     Breath sounds: Normal breath sounds.  Abdominal:     General: Bowel sounds are normal.  Lymphadenopathy:     Cervical: No cervical adenopathy.  Skin:    General: Skin is warm.  Neurological:     Mental Status: She is alert.  Psychiatric:        Mood and Affect: Mood normal.        Behavior: Behavior normal.        Thought Content: Thought content normal.        Judgment: Judgment normal.    BP 118/82   Pulse 68   Temp 98.2 F (36.8 C)   Resp 12   Ht 5\' 6"  (1.676 m)   Wt 183 lb 8 oz (83.2 kg)   SpO2 97%   BMI 29.62 kg/m  Wt Readings from Last 3 Encounters:  02/10/21 183 lb 8 oz (83.2 kg)  12/02/20 184 lb (83.5 kg)  11/26/20 184 lb 9.6 oz (83.7 kg)    Health Maintenance Due  Topic Date Due   Pneumonia Vaccine 33+ Years old (1 - PCV) Never done   TETANUS/TDAP  Never done   Zoster Vaccines- Shingrix (1 of 2) Never done   PAP SMEAR-Modifier  07/30/2019   COVID-19 Vaccine (6 - Booster for Pfizer series) 01/25/2021    There are no preventive care reminders to display for this patient.   Lab Results  Component Value Date   TSH 1.69 04/14/2020   Lab Results  Component Value Date   WBC 5.9 04/14/2020   HGB 13.7 04/14/2020   HCT 40.9 04/14/2020   MCV 88.6 04/14/2020   PLT 239.0 04/14/2020   Lab Results  Component Value Date   NA 139 04/14/2020   K 4.3  04/14/2020   CO2 29 04/14/2020   GLUCOSE 106 (H) 04/14/2020   BUN 17 04/14/2020   CREATININE 0.69 04/14/2020   BILITOT 1.0 05/12/2020   ALKPHOS 72 05/12/2020   AST 18 05/12/2020   ALT 12 05/12/2020   PROT 6.8 05/12/2020   ALBUMIN 4.4 05/12/2020  CALCIUM 9.3 04/14/2020   GFR 91.27 04/14/2020   Lab Results  Component Value Date   CHOL 227 (H) 04/14/2020   Lab Results  Component Value Date   HDL 76.60 04/14/2020   Lab Results  Component Value Date   LDLCALC 125 (H) 04/14/2020   Lab Results  Component Value Date   TRIG 127.0 04/14/2020   Lab Results  Component Value Date   CHOLHDL 3 04/14/2020   No results found for: HGBA1C     Assessment & Plan:   Problem List Items Addressed This Visit       Respiratory   Viral upper respiratory tract infection - Primary    Patients story symptoms and exam congruent with URI. Did discuss she can continue OTC treatments if helping. She had questions about if she was contagious. Given that her symptoms are improving and she is afebrile without fever reducing medications, likely hood is low.        No orders of the defined types were placed in this encounter.  This visit occurred during the SARS-CoV-2 public health emergency.  Safety protocols were in place, including screening questions prior to the visit, additional usage of staff PPE, and extensive cleaning of exam room while observing appropriate contact time as indicated for disinfecting solutions.   Romilda Garret, NP

## 2021-02-10 NOTE — Patient Instructions (Signed)
Nice to see you today Continue doing what you have been doing Monitor your symptoms Let me know if your symptoms stop improving and get worse

## 2021-02-10 NOTE — Assessment & Plan Note (Signed)
Patients story symptoms and exam congruent with URI. Did discuss she can continue OTC treatments if helping. She had questions about if she was contagious. Given that her symptoms are improving and she is afebrile without fever reducing medications, likely hood is low.

## 2021-04-06 DIAGNOSIS — H524 Presbyopia: Secondary | ICD-10-CM | POA: Diagnosis not present

## 2021-04-06 DIAGNOSIS — H1131 Conjunctival hemorrhage, right eye: Secondary | ICD-10-CM | POA: Diagnosis not present

## 2021-04-06 DIAGNOSIS — H35363 Drusen (degenerative) of macula, bilateral: Secondary | ICD-10-CM | POA: Diagnosis not present

## 2021-04-06 DIAGNOSIS — H04123 Dry eye syndrome of bilateral lacrimal glands: Secondary | ICD-10-CM | POA: Diagnosis not present

## 2021-04-06 DIAGNOSIS — Z961 Presence of intraocular lens: Secondary | ICD-10-CM | POA: Diagnosis not present

## 2021-04-27 ENCOUNTER — Encounter: Payer: Self-pay | Admitting: Nurse Practitioner

## 2021-04-27 ENCOUNTER — Ambulatory Visit (INDEPENDENT_AMBULATORY_CARE_PROVIDER_SITE_OTHER): Payer: PPO

## 2021-04-27 ENCOUNTER — Ambulatory Visit (INDEPENDENT_AMBULATORY_CARE_PROVIDER_SITE_OTHER): Payer: PPO | Admitting: Nurse Practitioner

## 2021-04-27 ENCOUNTER — Other Ambulatory Visit: Payer: Self-pay

## 2021-04-27 VITALS — BP 128/86 | HR 72 | Temp 97.2°F | Ht 65.75 in | Wt 190.0 lb

## 2021-04-27 DIAGNOSIS — Z23 Encounter for immunization: Secondary | ICD-10-CM

## 2021-04-27 DIAGNOSIS — Z136 Encounter for screening for cardiovascular disorders: Secondary | ICD-10-CM

## 2021-04-27 DIAGNOSIS — Z1231 Encounter for screening mammogram for malignant neoplasm of breast: Secondary | ICD-10-CM | POA: Diagnosis not present

## 2021-04-27 DIAGNOSIS — S6992XA Unspecified injury of left wrist, hand and finger(s), initial encounter: Secondary | ICD-10-CM

## 2021-04-27 DIAGNOSIS — M7989 Other specified soft tissue disorders: Secondary | ICD-10-CM | POA: Diagnosis not present

## 2021-04-27 DIAGNOSIS — M79642 Pain in left hand: Secondary | ICD-10-CM | POA: Diagnosis not present

## 2021-04-27 DIAGNOSIS — Z13220 Encounter for screening for lipoid disorders: Secondary | ICD-10-CM

## 2021-04-27 DIAGNOSIS — F411 Generalized anxiety disorder: Secondary | ICD-10-CM | POA: Diagnosis not present

## 2021-04-27 DIAGNOSIS — D171 Benign lipomatous neoplasm of skin and subcutaneous tissue of trunk: Secondary | ICD-10-CM | POA: Insufficient documentation

## 2021-04-27 DIAGNOSIS — Z0001 Encounter for general adult medical examination with abnormal findings: Secondary | ICD-10-CM | POA: Diagnosis not present

## 2021-04-27 DIAGNOSIS — L821 Other seborrheic keratosis: Secondary | ICD-10-CM | POA: Insufficient documentation

## 2021-04-27 MED ORDER — DULOXETINE HCL 20 MG PO CPEP: 20.0000 mg | ORAL_CAPSULE | Freq: Every day | ORAL | 3 refills | Status: DC

## 2021-04-27 NOTE — Patient Instructions (Signed)
Go to lab for hand x-ray  Schedule lab appt for fasting blood draw.  Preventive Care 37 Years and Older, Female Preventive care refers to lifestyle choices and visits with your health care provider that can promote health and wellness. Preventive care visits are also called wellness exams. What can I expect for my preventive care visit? Counseling Your health care provider may ask you questions about your: Medical history, including: Past medical problems. Family medical history. Pregnancy and menstrual history. History of falls. Current health, including: Memory and ability to understand (cognition). Emotional well-being. Home life and relationship well-being. Sexual activity and sexual health. Lifestyle, including: Alcohol, nicotine or tobacco, and drug use. Access to firearms. Diet, exercise, and sleep habits. Work and work Statistician. Sunscreen use. Safety issues such as seatbelt and bike helmet use. Physical exam Your health care provider will check your: Height and weight. These may be used to calculate your BMI (body mass index). BMI is a measurement that tells if you are at a healthy weight. Waist circumference. This measures the distance around your waistline. This measurement also tells if you are at a healthy weight and may help predict your risk of certain diseases, such as type 2 diabetes and high blood pressure. Heart rate and blood pressure. Body temperature. Skin for abnormal spots. What immunizations do I need? Vaccines are usually given at various ages, according to a schedule. Your health care provider will recommend vaccines for you based on your age, medical history, and lifestyle or other factors, such as travel or where you work. What tests do I need? Screening Your health care provider may recommend screening tests for certain conditions. This may include: Lipid and cholesterol levels. Hepatitis C test. Hepatitis B test. HIV (human immunodeficiency  virus) test. STI (sexually transmitted infection) testing, if you are at risk. Lung cancer screening. Colorectal cancer screening. Diabetes screening. This is done by checking your blood sugar (glucose) after you have not eaten for a while (fasting). Mammogram. Talk with your health care provider about how often you should have regular mammograms. BRCA-related cancer screening. This may be done if you have a family history of breast, ovarian, tubal, or peritoneal cancers. Bone density scan. This is done to screen for osteoporosis. Talk with your health care provider about your test results, treatment options, and if necessary, the need for more tests. Follow these instructions at home: Eating and drinking  Eat a diet that includes fresh fruits and vegetables, whole grains, lean protein, and low-fat dairy products. Limit your intake of foods with high amounts of sugar, saturated fats, and salt. Take vitamin and mineral supplements as recommended by your health care provider. Do not drink alcohol if your health care provider tells you not to drink. If you drink alcohol: Limit how much you have to 0-1 drink a day. Know how much alcohol is in your drink. In the [place] one drink equals one 12 oz bottle of beer (355 mL), one 5 oz glass of wine (148 mL), or one 1 oz glass of hard liquor (44 mL). Lifestyle Brush your teeth every morning and night with fluoride toothpaste. Floss one time each day. Exercise for at least 30 minutes 5 or more days each week. Do not use any products that contain nicotine or tobacco. These products include cigarettes, chewing tobacco, and vaping devices, such as e-cigarettes. If you need help quitting, ask your health care provider. Do not use drugs. If you are sexually active, practice safe sex. Use a condom or other  form of protection in order to prevent STIs. Take aspirin only as told by your health care provider. Make sure that you understand how much to take and what  form to take. Work with your health care provider to find out whether it is safe and beneficial for you to take aspirin daily. Ask your health care provider if you need to take a cholesterol-lowering medicine (statin). Find healthy ways to manage stress, such as: Meditation, yoga, or listening to music. Journaling. Talking to a trusted person. Spending time with friends and family. Minimize exposure to UV radiation to reduce your risk of skin cancer. Safety Always wear your seat belt while driving or riding in a vehicle. Do not drive: If you have been drinking alcohol. Do not ride with someone who has been drinking. When you are tired or distracted. While texting. If you have been using any mind-altering substances or drugs. Wear a helmet and other protective equipment during sports activities. If you have firearms in your house, make sure you follow all gun safety procedures. What's next? Visit your health care provider once a year for an annual wellness visit. Ask your health care provider how often you should have your eyes and teeth checked. Stay up to date on all vaccines. This information is not intended to replace advice given to you by your health care provider. Make sure you discuss any questions you have with your health care provider. Document Revised: 08/26/2020 Document Reviewed: 08/26/2020 Elsevier Patient Education  [place].

## 2021-04-27 NOTE — Assessment & Plan Note (Signed)
Stable with cymbalta Refill sent 

## 2021-04-27 NOTE — Progress Notes (Signed)
Subjective:    Patient ID: Monique Rangel, female    DOB: 07/09/1955, 66 y.o.   MRN: [id number]  Patient presents today for CPE and eval of chronic/acute conditions  Hand Injury  The incident occurred 5 to 7 days ago. The incident occurred in the street. The injury mechanism was a fall and twisted. The pain is present in the left hand. The quality of the pain is described as aching. The pain does not radiate. The pain is moderate. The pain has been Constant since the incident. Associated symptoms include muscle weakness. Pertinent negatives include no chest pain, numbness or tingling. The symptoms are aggravated by movement and palpation. She has tried nothing for the symptoms.  GAD (generalized anxiety disorder) Stable with cymbalta Refill sent  Vision:up to date Dental:up to date Diet:regular Exercise:walking Weight:  Wt Readings from Last 3 Encounters:  04/27/21 190 lb (86.2 kg)  02/10/21 183 lb 8 oz (83.2 kg)  12/02/20 184 lb (83.5 kg)    Sexual History (orientation,birth control, marital status, STD): needs repeat mammogram, up to date with dexa scan and colonoscopy  Depression/Suicide: Depression screen [facility] 2/9 04/27/2021 04/27/2021 11/26/2020 10/20/2020 04/14/2020  Decreased Interest 0 0 0 0 1  Down, Depressed, Hopeless 0 0 0 0 1  PHQ - 2 Score 0 0 0 0 2  Altered sleeping 0 0 - - 3  Tired, decreased energy 2 2 - - 0  Change in appetite 0 0 - - 0  Feeling bad or failure about yourself  0 0 - - 2  Trouble concentrating 1 1 - - 0  Moving slowly or fidgety/restless 0 0 - - 0  Suicidal thoughts 0 0 - - 0  PHQ-9 Score 3 3 - - 7  Difficult doing work/chores Not difficult at all Not difficult at all - - Not difficult at all   GAD 7 : Generalized Anxiety Score 04/27/2021 04/27/2021 04/14/2020  Nervous, Anxious, on Edge 2 2 2   Control/stop worrying 0 0 1  Worry too much - different things 0 0 2  Trouble relaxing 0 0 1  Restless 1 1 0  Easily annoyed or irritable 1 1 1   Afraid -  awful might happen 1 1 1   Total GAD 7 Score 5 5 8   Anxiety Difficulty Not difficult at all Not difficult at all Not difficult at all   Immunizations: (TDAP, Hep C screen, Pneumovax, Influenza, zoster)  Health Maintenance  Topic Date Due   Tetanus Vaccine  Never done   COVID-19 Vaccine (6 - Booster for Pfizer series) 01/25/2021   Mammogram  06/04/2022   Colon Cancer Screening  10/18/2026   Pneumonia Vaccine  Completed   Flu Shot  Completed   DEXA scan (bone density measurement)  Completed   Hepatitis C Screening: USPSTF Recommendation to screen - Ages 38-79 yo.  Completed   Zoster (Shingles) Vaccine  Completed   HPV Vaccine  Aged Out   Fall Risk: Fall Risk  11/26/2020 10/30/2020 10/20/2020 04/14/2020  Falls in the past year? 0 0 0 0  Number falls in past yr: - 0 - 0  Injury with Fall? - 0 - 0  Risk for fall due to : - - - No Fall Risks  Follow up - - - Falls evaluation completed   Medications and allergies reviewed with patient and updated if appropriate.  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  8 g 0   diclofenac Sodium (VOLTAREN) 1 % GEL Apply a small grape sized amount to right knee every 6 hours as needed. 150 g 1   diphenhydrAMINE (BENADRYL) 25 MG tablet Take 25 mg by mouth at bedtime as needed.     fluticasone (FLONASE) 50 MCG/ACT nasal spray Place 2 sprays into both nostrils every morning.     glucosamine-chondroitin 500-400 MG tablet Take 1 tablet by mouth 3 (three) times daily.     No current facility-administered medications on file prior to visit.    Past Medical History:  Diagnosis Date   Allergy    Anxiety 05/11/2016    Past Surgical History:  Procedure Laterality Date   EYE SURGERY  both cataracts    Social History   Socioeconomic History   Marital status: Married    Spouse name: Not on file   Number of children: 2   Years of education: Not on file   Highest education level: Not on file  Occupational History   Not on file  Tobacco Use   Smoking status: Never   Smokeless tobacco: Never  Vaping Use   Vaping Use: Never used  Substance and Sexual Activity   Alcohol use: Yes    Alcohol/week: 5.0 standard drinks    Types: 5 Glasses of wine per week    Comment: usually with a meal   Drug use: Never   Sexual activity: Not Currently    Birth control/protection: Rangel-menopausal  Other Topics Concern   Not on file  Social History Narrative   Not on file   Social Determinants of Health   Financial Resource Strain: Not on file  Food Insecurity: Not on file  Transportation Needs: Not on file  Physical Activity: Not on file  Stress: Not on file  Social Connections: Not on file    Family History  Problem Relation Age of Onset   Hearing loss Mother    Cancer Father    Diabetes Father    Arthritis Maternal Aunt    Vision loss Maternal Aunt    Breast cancer Neg Hx        Review of Systems  Constitutional:  Negative for fever, malaise/fatigue and weight loss.  HENT:  Negative for congestion and sore throat.   Eyes:        Negative for visual  changes  Respiratory:  Negative for cough and shortness of breath.   Cardiovascular:  Negative for chest pain, palpitations and leg swelling.  Gastrointestinal:  Negative for blood in stool, constipation, diarrhea and heartburn.  Genitourinary:  Negative for dysuria, frequency and urgency.  Musculoskeletal:  Negative for falls, joint pain and myalgias.  Skin:  Negative for rash.  Neurological:  Negative for dizziness,  tingling, sensory change, numbness and headaches.  Endo/Heme/Allergies:  Does not bruise/bleed easily.  Psychiatric/Behavioral:  Negative for depression, substance abuse and suicidal ideas. The patient is not nervous/anxious and does not have insomnia.    Objective:   Vitals:   04/27/21 1128  BP: 128/86  Pulse: 72  Temp: (!) 97.2 F (36.2 C)  SpO2: 98%   Body mass index is 30.9 kg/m.  Physical Examination:  Physical Exam Vitals reviewed.  Constitutional:      General: She is not in acute distress.    Appearance: She is well-developed. She is obese.  HENT:     Right Ear: Tympanic membrane, ear canal and external ear normal.     Left Ear: Tympanic membrane, ear canal and external ear normal.  Eyes:     Extraocular Movements: Extraocular movements intact.     Conjunctiva/sclera: Conjunctivae normal.  Cardiovascular:     Rate and Rhythm: Normal rate and regular rhythm.     Pulses: Normal pulses.     Heart sounds: Normal heart sounds.  Pulmonary:     Effort: Pulmonary effort is normal. No respiratory distress.     Breath sounds: Normal breath sounds.  Chest:     Chest wall: No tenderness.  Abdominal:     General: Bowel sounds are normal.     Palpations: Abdomen is soft.  Musculoskeletal:        General: Swelling, tenderness and signs of injury present.     Right wrist: Normal.     Left wrist: Normal.     Right hand: Normal.     Left hand: Swelling, tenderness and bony tenderness present. No deformity. Decreased range of motion. Decreased strength of  finger abduction. Normal strength of thumb/finger opposition and wrist extension. Normal sensation. Normal capillary refill. Normal pulse.     Cervical back: Normal range of motion and neck supple.     Right lower leg: No edema.     Left lower leg: No edema.  Lymphadenopathy:     Cervical: No cervical adenopathy.  Skin:    General: Skin is warm and dry.  Neurological:     Mental Status: She is alert and oriented to person, place, and time.     Deep Tendon Reflexes: Reflexes are normal and symmetric.  Psychiatric:        Mood and Affect: Mood normal.        Behavior: Behavior normal.   ASSESSMENT and PLAN: This visit occurred during the SARS-CoV-2 public health emergency.  Safety protocols were in place, including screening questions prior to the visit, additional usage of staff PPE, and extensive cleaning of exam room while observing appropriate contact time as indicated for disinfecting solutions.   Terrel was seen today for annual exam.  Diagnoses and all orders for this visit:  Encounter for preventative adult health care exam with abnormal findings -     Comprehensive metabolic panel; Future -     Lipid panel; Future -     CBC; Future  GAD (generalized anxiety disorder) -     DULoxetine (CYMBALTA) 20 MG capsule; Take 1 capsule (20 mg total) by mouth daily.  Breast cancer screening by mammogram -     MM 3D SCREEN BREAST BILATERAL; Future  Encounter for lipid screening for cardiovascular disease -     Lipid panel; Future  Hand injury, left, initial encounter -     DG Hand Complete Left  Need for prophylactic vaccination against Streptococcus pneumoniae (pneumococcus) -  Pneumococcal conjugate vaccine 20-valent (Prevnar 20)      Problem List Items Addressed This Visit       Other   GAD (generalized anxiety disorder)    Stable with cymbalta Refill sent      Relevant Medications   DULoxetine (CYMBALTA) 20 MG capsule   Other Visit Diagnoses     Encounter for  preventative adult health care exam with abnormal findings    -  Primary   Relevant Orders   Comprehensive metabolic panel   Lipid panel   CBC   Breast cancer screening by mammogram       Relevant Orders   MM 3D SCREEN BREAST BILATERAL   Encounter for lipid screening for cardiovascular disease       Relevant Orders   Lipid panel   Hand injury, left, initial encounter       Relevant Orders   DG Hand Complete Left   Need for prophylactic vaccination against Streptococcus pneumoniae (pneumococcus)       Relevant Orders   Pneumococcal conjugate vaccine 20-valent (Prevnar 20) (Completed)       Follow up: Return in about 1 year (around 04/27/2022) for CPE (fasting).  Wilfred Lacy, NP

## 2021-04-28 ENCOUNTER — Other Ambulatory Visit (INDEPENDENT_AMBULATORY_CARE_PROVIDER_SITE_OTHER): Payer: PPO

## 2021-04-28 DIAGNOSIS — Z136 Encounter for screening for cardiovascular disorders: Secondary | ICD-10-CM

## 2021-04-28 DIAGNOSIS — B351 Tinea unguium: Secondary | ICD-10-CM | POA: Diagnosis not present

## 2021-04-28 DIAGNOSIS — Z13220 Encounter for screening for lipoid disorders: Secondary | ICD-10-CM | POA: Diagnosis not present

## 2021-04-28 DIAGNOSIS — Z0001 Encounter for general adult medical examination with abnormal findings: Secondary | ICD-10-CM

## 2021-04-28 LAB — CBC
HCT: 39.1 % (ref 36.0–46.0)
Hemoglobin: 13 g/dL (ref 12.0–15.0)
MCHC: 33.3 g/dL (ref 30.0–36.0)
MCV: 87.4 fl (ref 78.0–100.0)
Platelets: 266 10*3/uL (ref 150.0–400.0)
RBC: 4.48 Mil/uL (ref 3.87–5.11)
RDW: 13.4 % (ref 11.5–15.5)
WBC: 6.5 10*3/uL (ref 4.0–10.5)

## 2021-04-28 NOTE — Progress Notes (Signed)
Per the orders of Monique Rangel pt is here for labs, pt tolerated draw well.

## 2021-04-29 ENCOUNTER — Encounter: Payer: Self-pay | Admitting: Nurse Practitioner

## 2021-04-29 LAB — COMPREHENSIVE METABOLIC PANEL
ALT: 10 U/L (ref 0–35)
AST: 16 U/L (ref 0–37)
Albumin: 4.3 g/dL (ref 3.5–5.2)
Alkaline Phosphatase: 66 U/L (ref 39–117)
BUN: 17 mg/dL (ref 6–23)
CO2: 33 mEq/L — ABNORMAL HIGH (ref 19–32)
Calcium: 9.3 mg/dL (ref 8.4–10.5)
Chloride: 104 mEq/L (ref 96–112)
Creatinine, Ser: 0.82 mg/dL (ref 0.40–1.20)
GFR: 74.68 mL/min (ref >60.00)
Glucose, Bld: 105 mg/dL — ABNORMAL HIGH (ref 70–99)
Potassium: 4.3 mEq/L (ref 3.5–5.1)
Sodium: 140 mEq/L (ref 135–145)
Total Bilirubin: 1 mg/dL (ref 0.2–1.2)
Total Protein: 6.8 g/dL (ref 6.0–8.3)

## 2021-04-29 LAB — LIPID PANEL
Cholesterol: 237 mg/dL — ABNORMAL HIGH (ref 0–200)
HDL: 78.1 mg/dL (ref >39.00)
LDL Cholesterol: 134 mg/dL — ABNORMAL HIGH (ref 0–99)
NonHDL: 158.5
Total CHOL/HDL Ratio: 3
Triglycerides: 121 mg/dL (ref 0.0–149.0)
VLDL: 24.2 mg/dL (ref 0.0–40.0)

## 2021-04-29 LAB — HEPATIC FUNCTION PANEL
ALT: 10 U/L (ref 0–35)
AST: 16 U/L (ref 0–37)
Albumin: 4.3 g/dL (ref 3.5–5.2)
Alkaline Phosphatase: 66 U/L (ref 39–117)
Bilirubin, Direct: 0.1 mg/dL (ref 0.0–0.3)
Total Bilirubin: 1 mg/dL (ref 0.2–1.2)
Total Protein: 6.8 g/dL (ref 6.0–8.3)

## 2021-04-30 ENCOUNTER — Other Ambulatory Visit: Payer: Self-pay | Admitting: Nurse Practitioner

## 2021-04-30 DIAGNOSIS — F411 Generalized anxiety disorder: Secondary | ICD-10-CM

## 2021-05-18 ENCOUNTER — Ambulatory Visit
Admission: EM | Admit: 2021-05-18 | Discharge: 2021-05-18 | Disposition: A | Discharge status: Home / Self Care | Payer: PPO | Attending: Emergency Medicine | Admitting: Emergency Medicine

## 2021-05-18 ENCOUNTER — Other Ambulatory Visit: Payer: Self-pay

## 2021-05-18 ENCOUNTER — Telehealth: Payer: Self-pay | Admitting: Nurse Practitioner

## 2021-05-18 ENCOUNTER — Encounter: Payer: Self-pay | Admitting: Emergency Medicine

## 2021-05-18 DIAGNOSIS — R197 Diarrhea, unspecified: Secondary | ICD-10-CM | POA: Diagnosis not present

## 2021-05-18 DIAGNOSIS — R112 Nausea with vomiting, unspecified: Secondary | ICD-10-CM | POA: Diagnosis not present

## 2021-05-18 MED ORDER — ONDANSETRON 8 MG PO TBDP: 8.0000 mg | ORAL_TABLET | Freq: Three times a day (TID) | ORAL | 0 refills | Status: DC | PRN

## 2021-05-18 NOTE — Telephone Encounter (Signed)
FYI:Pt called in needing an appointment for feeling sick. She had been throwing up all night, she can't eat and had a 102temp, this has been going on since last night.  I transferred her over to Nurse Triage. She does have an appointment tomorrow 05/19/21 with Dr. Ethelene Hal for this concern.  ?

## 2021-05-18 NOTE — ED Triage Notes (Signed)
Pt reports n/v/d that started last night. Reports fevers and headaches today and was advised by PCP to go be seen at [facility]. Reports in last hour able to tolerate foods/liquids.  ?

## 2021-05-18 NOTE — Discharge Instructions (Addendum)
Please read the enclosed information regarding food choices to help relieve diarrhea.  I provided you with a prescription for Zofran to take every 8 hours as needed for nausea.  Please be sure that your fluid intake is limited to electrolytes replacement solutions such as Gatorade or Pedialyte. ? ?Please follow-up with your primary care provider. ?

## 2021-05-18 NOTE — ED Provider Notes (Signed)
[facility]    CSN: [id number] Arrival date & time: 05/18/21  1544    HISTORY   Chief Complaint  Patient presents with   Emesis   Diarrhea   Headache   HPI Monique Rangel is a 66 y.o. female. Pt reports n/v/d that started last night. Reports fevers and headaches today and was advised by PCP to go be seen at [facility]. Reports that in the last hour has been able to tolerate water and soda crackers.  EMR reviewed, patient contacted her primary care provider at 3:00 today and was advised to keep the appointment she has scheduled for tomorrow at 10:20 AM with Dr. Ethelene Hal for this issue.  Patient states she has here today to make sure she is "okay".  On arrival, patient has a heart rate of 118 with a temperature of 99.7, otherwise blood pressure is normal patient is oxygenating well.  Patient is wearing dark sunglasses during the exam today although she denies headache.  Patient denies bloody diarrhea, coffee-ground emesis.  The history is provided by the patient.  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    Home Medications     Prior to Admission medications   Medication Sig Start Date End Date Taking? Authorizing Provider  albuterol (VENTOLIN HFA) 108 (90 Base) MCG/ACT inhaler Inhale 1-2 puffs into the lungs every 6 (six) hours as needed for wheezing or shortness of breath. 04/14/20   Nche, Charlene Brooke, NP  diclofenac Sodium (VOLTAREN) 1 % GEL Apply a small grape sized amount to right knee every 6 hours as needed. 11/26/20   Libby Maw, MD  diphenhydrAMINE (BENADRYL) 25 MG tablet Take 25 mg by mouth at bedtime as needed.    [provider]  DULoxetine (CYMBALTA) 20 MG capsule Take 1 capsule (20 mg total) by mouth daily. 04/27/21   Nche, Charlene Brooke, NP  fluticasone (FLONASE) 50 MCG/ACT nasal spray Place 2 sprays into both nostrils every morning.    [provider]  glucosamine-chondroitin 500-400 MG tablet Take 1 tablet by mouth 3 (three) times daily.    [provider]   Family History Family History  Problem Relation Age of Onset   Hearing loss Mother    Cancer Father    Diabetes Father    Arthritis Maternal Aunt    Vision loss Maternal Aunt    Breast cancer Neg Hx    Social History Social History   Tobacco Use   Smoking status: Never   Smokeless tobacco: Never  Vaping Use   Vaping Use: Never used  Substance Use Topics   Alcohol use: Yes    Alcohol/week: 5.0  standard drinks    Types: 5 Glasses of wine per week    Comment: usually with a meal   Drug use: Never   Allergies   Lamisil [terbinafine] and Quinine  Review of Systems Review of Systems Pertinent findings noted in history of present illness.   Physical Exam Triage Vital Signs ED Triage Vitals  Enc Vitals Group     BP 01/08/21 0827 (!) 147/82     Pulse Rate 01/08/21 0827 72     Resp 01/08/21 0827 18     Temp 01/08/21 0827 98.3 F (36.8 C)     Temp Source 01/08/21 0827 Oral     SpO2 01/08/21 0827 98 %     Weight --      Height --      Head Circumference --      Peak Flow --      Pain  Score 01/08/21 0826 5     Pain Loc --      Pain Edu? --      Excl. in [place]? --   No data found.  Updated Vital Signs BP 104/69 (BP Location: Left Arm)    Pulse (!) 118    Temp 99.7 F (37.6 C) (Oral)    Resp 20    SpO2 95%   Physical Exam Vitals and nursing note reviewed.  Constitutional:      General: She is not in acute distress.    Appearance: Normal appearance. She is not ill-appearing.  HENT:     Head: Normocephalic and atraumatic.  Eyes:     General: Lids are normal.        Right eye: No discharge.        Left eye: No discharge.     Extraocular Movements: Extraocular movements intact.     Conjunctiva/sclera: Conjunctivae normal.     Right eye: Right conjunctiva is not injected.     Left eye: Left conjunctiva is not injected.  Neck:     Trachea: Trachea and phonation normal.  Cardiovascular:     Rate and Rhythm: Normal rate and regular rhythm.     Pulses: Normal pulses.     Heart sounds: Normal heart sounds. No murmur heard.   No friction rub. No gallop.  Pulmonary:     Effort: Pulmonary effort is normal. No accessory muscle usage, prolonged expiration or respiratory distress.     Breath sounds: Normal breath sounds. No stridor, decreased air movement or transmitted upper airway sounds. No decreased breath sounds, wheezing, rhonchi or rales.  Chest:     Chest wall: No tenderness.  Musculoskeletal:        General: Normal range of motion.     Cervical back: Normal range of motion and neck supple. Normal range of motion.  Lymphadenopathy:     Cervical: No cervical adenopathy.  Skin:    General: Skin is warm and dry.     Findings: No erythema or rash.  Neurological:     General: No focal deficit present.     Mental Status: She is alert and oriented to person, place, and time.  Psychiatric:        Mood and Affect: Mood normal.        Behavior: Behavior normal.    Visual Acuity Right Eye Distance:   Left Eye Distance:   Bilateral Distance:    Right Eye Near:    Left Eye Near:    Bilateral Near:     UC Couse / Diagnostics / Procedures:  EKG  Radiology No results found.  Procedures Procedures (including critical care time)  UC Diagnoses / Final Clinical Impressions(s)   I have reviewed the triage vital signs and the nursing notes.  Pertinent labs & imaging results that were available during my care of the patient were reviewed by me and considered in my medical decision making (see chart for details).   Final diagnoses:  Nausea vomiting and diarrhea   Patient advised that if she is able to tolerate clear liquids and soda crackers that she should continue to consume these until her appointment tomorrow with Dr. Ethelene Hal.  ED Prescriptions     Medication Sig Dispense Auth. Provider   ondansetron (ZOFRAN-ODT) 8 MG disintegrating tablet Take 1 tablet (8 mg total) by mouth every 8 (eight) hours as needed for nausea or vomiting. 20 tablet Lynden Oxford Scales, PA-C      PDMP not reviewed this encounter.  Pending results:  Labs Reviewed  COVID-19, FLU A+B NAA    Medications Ordered in UC: Medications - No data to display  Disposition Upon Discharge:  Condition: stable for discharge home Home: take medications as prescribed; routine discharge instructions as discussed; follow up as advised.  Patient presented with an acute illness with associated systemic symptoms and significant discomfort requiring urgent management. In my opinion, this is a condition that a prudent lay person (someone who possesses an average knowledge of health and medicine) may potentially expect to result in complications if not addressed urgently such as respiratory distress, impairment of bodily function or dysfunction of bodily organs.   Routine symptom specific, illness specific and/or disease specific instructions were discussed with the patient and/or caregiver at length.   As such, the patient has been evaluated and assessed, work-up was performed and  treatment was provided in alignment with urgent care protocols and evidence based medicine.  Patient/parent/caregiver has been advised that the patient may require follow up for further testing and treatment if the symptoms continue in spite of treatment, as clinically indicated and appropriate.  If the patient was tested for COVID-19, Influenza and/or RSV, then the patient/parent/guardian was advised to isolate at home pending the results of his/her diagnostic coronavirus test and potentially longer if theyre positive. I have also advised pt that if his/her COVID-19 test returns positive, it's recommended to self-isolate for at least 10 days after symptoms first appeared AND until fever-free for 24 hours without fever reducer AND other symptoms have improved or resolved. Discussed self-isolation recommendations as well as instructions for household member/close contacts as per the [facility] and [place] DHHS, and also gave patient the [place] packet with this information.  Patient/parent/caregiver has been advised to return to the [facility] or PCP in 3-5 days if no better; to PCP or the Emergency Department if new signs and symptoms develop, or if the current signs or symptoms continue to change or worsen for further workup, evaluation and treatment as clinically indicated and appropriate  The patient will follow up with their current PCP if and as advised. If the patient does not currently have a PCP we will assist them in obtaining one.   The patient may need specialty follow up if the symptoms continue, in spite of conservative treatment and management, for further workup, evaluation, consultation and treatment as clinically indicated and appropriate.  Patient/parent/caregiver verbalized understanding and agreement of plan as discussed.  All questions were addressed during visit.  Please see discharge instructions below for further details of plan.  Discharge Instructions:   Discharge Instructions  Please  read the enclosed information regarding food choices to help relieve diarrhea.  I provided you with a prescription for Zofran to take every 8 hours as needed for nausea.  Please be sure that your fluid intake is limited to electrolytes replacement solutions such as Gatorade or Pedialyte.  Please follow-up with your primary care provider.      This office note has been dictated using Museum/gallery curator.  Unfortunately, and despite my best efforts, this method of dictation can sometimes lead to occasional typographical or grammatical errors.  I apologize in advance if this occurs.     Lynden Oxford Scales, [place] 05/19/21 [phone]

## 2021-05-19 ENCOUNTER — Telehealth (INDEPENDENT_AMBULATORY_CARE_PROVIDER_SITE_OTHER): Payer: PPO | Admitting: Family Medicine

## 2021-05-19 ENCOUNTER — Encounter: Payer: Self-pay | Admitting: Family Medicine

## 2021-05-19 VITALS — Temp 99.2°F | Ht 65.0 in | Wt 190.0 lb

## 2021-05-19 DIAGNOSIS — K529 Noninfective gastroenteritis and colitis, unspecified: Secondary | ICD-10-CM | POA: Diagnosis not present

## 2021-05-19 NOTE — Progress Notes (Signed)
? ?Established Patient Office Visit ? ?Subjective:  ?Patient ID: Monique Rangel, female    DOB: [birth year]  Age: 66 y.o. MRN: [id number] ? ?CC:  ?Chief Complaint  ?Patient presents with  ? Emesis  ?  Vomiting, diarrhea, fever and headache x 2 days ago seen at urgent care given medication for vomiting. Little dehydrated there have been improvement but headache come and go. Not sure if there's something else she needs to be doing.   ? ? ?HPI ?Monique Rangel presents for follow-up from urgent care visit yesterday.  She has a 2-day history of headache, fever and chills, postnasal drip, nasal congestion.  There is been nausea and vomiting and loose stool.  Fortunately symptoms seem to be resolving.  She is feeling better.  She is tolerating a soft diet and drinking plenty of fluids.  Headache and fever have resolved.  She is no longer requiring medicine for nausea. ? ?Past Medical History:  ?Diagnosis Date  ? Allergy   ? Anxiety 05/11/2016  ? ? ?Past Surgical History:  ?Procedure Laterality Date  ? EYE SURGERY  both cataracts  ? ? ?Family History  ?Problem Relation Age of Onset  ? Hearing loss Mother   ? Cancer Father   ? Diabetes Father   ? Arthritis Maternal Aunt   ? Vision loss Maternal Aunt   ? Breast cancer Neg Hx   ? ? ?Social History  ? ?Socioeconomic History  ? Marital status: Married  ?  Spouse name: Not on file  ? Number of children: 2  ? Years of education: Not on file  ? Highest education level: Not on file  ?Occupational History  ? Not on file  ?Tobacco Use  ? Smoking status: Never  ? Smokeless tobacco: Never  ?Vaping Use  ? Vaping Use: Never used  ?Substance and Sexual Activity  ? Alcohol use: Yes  ?  Alcohol/week: 5.0 standard drinks  ?  Types: 5 Glasses of wine per week  ?  Comment: usually with a meal  ? Drug use: Never  ? Sexual activity: Not Currently  ?  Birth control/protection: Post-menopausal  ?Other Topics Concern  ? Not on file  ?Social History Narrative  ? Not on file  ? ?Social Determinants of  Health  ? ?Financial Resource Strain: Not on file  ?Food Insecurity: Not on file  ?Transportation Needs: Not on file  ?Physical Activity: Not on file  ?Stress: Not on file  ?Social Connections: Not on file  ?Intimate Partner Violence: Not on file  ? ? ?Outpatient Medications Prior to Visit  ?Medication Sig Dispense Refill  ? albuterol (VENTOLIN HFA) 108 (90 Base) MCG/ACT inhaler Inhale 1-2 puffs into the lungs every 6 (six) hours as needed for wheezing or shortness of breath. 8 g 0  ? diclofenac Sodium (VOLTAREN) 1 % GEL Apply a small grape sized amount to right knee every 6 hours as needed. 150 g 1  ? diphenhydrAMINE (BENADRYL) 25 MG tablet Take 25 mg by mouth at bedtime as needed.    ? DULoxetine (CYMBALTA) 20 MG capsule Take 1 capsule (20 mg total) by mouth daily. 90 capsule 3  ? fluticasone (FLONASE) 50 MCG/ACT nasal spray Place 2 sprays into both nostrils every morning.    ? glucosamine-chondroitin 500-400 MG tablet Take 1 tablet by mouth 3 (three) times daily.    ? ondansetron (ZOFRAN-ODT) 8 MG disintegrating tablet Take 1 tablet (8 mg total) by mouth every 8 (eight) hours as needed for nausea or vomiting.  20 tablet 0  ? ?No facility-administered medications prior to visit.  ? ? ?Allergies  ?Allergen Reactions  ? Lamisil [Terbinafine] Rash  ? Quinine Other (See Comments)  ?  Mother had a bad reaction so does not want to use it ?Mother had a bad reaction so does not want to use it ?  ? ? ?ROS ?Review of Systems  ?Constitutional:  Positive for chills and fever. Negative for diaphoresis, fatigue and unexpected weight change.  ?HENT:  Positive for congestion and postnasal drip. Negative for sore throat.   ?Eyes:  Negative for photophobia and visual disturbance.  ?Respiratory:  Negative for cough, shortness of breath and wheezing.   ?Cardiovascular: Negative.   ?Gastrointestinal:  Positive for diarrhea. Negative for nausea and vomiting.  ?Musculoskeletal:  Negative for arthralgias and myalgias.  ?Skin:  Negative  for rash.  ?Neurological:  Positive for headaches. Negative for speech difficulty and weakness.  ? ?  ?Objective:  ?  ?Physical Exam ?Vitals and nursing note reviewed.  ?Constitutional:   ?   General: She is not in acute distress. ?   Appearance: Normal appearance. She is not ill-appearing, toxic-appearing or diaphoretic.  ?HENT:  ?   Head: Normocephalic and atraumatic.  ?Eyes:  ?   General: No scleral icterus.    ?   Right eye: No discharge.     ?   Left eye: No discharge.  ?   Conjunctiva/sclera: Conjunctivae normal.  ?Pulmonary:  ?   Effort: Pulmonary effort is normal.  ?Neurological:  ?   Mental Status: She is alert and oriented to person, place, and time.  ?Psychiatric:     ?   Mood and Affect: Mood normal.     ?   Behavior: Behavior normal.  ? ? ?Temp 99.2 ?F (37.3 ?C) (Temporal)   Ht '5\' 5"'$  (1.651 m)   Wt 190 lb (86.2 kg)   BMI 31.62 kg/m?  ?Wt Readings from Last 3 Encounters:  ?05/19/21 190 lb (86.2 kg)  ?04/27/21 190 lb (86.2 kg)  ?02/10/21 183 lb 8 oz (83.2 kg)  ? ? ? ?There are no preventive care reminders to display for this patient. ? ?There are no preventive care reminders to display for this patient. ? ?Lab Results  ?Component Value Date  ? TSH 1.69 04/14/2020  ? ?Lab Results  ?Component Value Date  ? WBC 6.5 04/28/2021  ? HGB 13.0 04/28/2021  ? HCT 39.1 04/28/2021  ? MCV 87.4 04/28/2021  ? PLT 266.0 04/28/2021  ? ?Lab Results  ?Component Value Date  ? NA 140 04/28/2021  ? K 4.3 04/28/2021  ? CO2 33 (H) 04/28/2021  ? GLUCOSE 105 (H) 04/28/2021  ? BUN 17 04/28/2021  ? CREATININE 0.82 04/28/2021  ? BILITOT 1.0 04/28/2021  ? BILITOT 1.0 04/28/2021  ? ALKPHOS 66 04/28/2021  ? ALKPHOS 66 04/28/2021  ? AST 16 04/28/2021  ? AST 16 04/28/2021  ? ALT 10 04/28/2021  ? ALT 10 04/28/2021  ? PROT 6.8 04/28/2021  ? PROT 6.8 04/28/2021  ? ALBUMIN 4.3 04/28/2021  ? ALBUMIN 4.3 04/28/2021  ? CALCIUM 9.3 04/28/2021  ? GFR 74.68 04/28/2021  ? ?Lab Results  ?Component Value Date  ? CHOL 237 (H) 04/28/2021  ? ?Lab  Results  ?Component Value Date  ? HDL 78.10 04/28/2021  ? ?Lab Results  ?Component Value Date  ? LDLCALC 134 (H) 04/28/2021  ? ?Lab Results  ?Component Value Date  ? TRIG 121.0 04/28/2021  ? ?Lab Results  ?Component Value Date  ?  CHOLHDL 3 04/28/2021  ? ?No results found for: HGBA1C ? ?  ?Assessment & Plan:  ? ?Problem List Items Addressed This Visit   ?None ?Visit Diagnoses   ? ? Gastroenteritis    -  Primary  ? ?  ? ? ?No orders of the defined types were placed in this encounter. ? ? ?Follow-up: Return if symptoms worsen or fail to improve.  ?Continue soft diet for the next few days and then advance as tolerated.  Continue copious fluid intake.  Ginger ale could be a good choice.  Please return if you do not continue to improve. ? ?Libby Maw, MD ? ? ?Virtual Visit via Video Note ? ?I connected with Monique Rangel on 05/19/21 at 10:20 AM EST by a video enabled telemedicine application and verified that I am speaking with the correct person using two identifiers. ? ?Location: ?Patient: home with her husband.  ?Provider: work ?  ?I discussed the limitations of evaluation and management by telemedicine and the availability of in person appointments. The patient expressed understanding and agreed to proceed. ? ?History of Present Illness: ? ?  ?Observations/Objective: ? ? ?Assessment and Plan: ? ? ?Follow Up Instructions: ? ?  ?I discussed the assessment and treatment plan with the patient. The patient was provided an opportunity to ask questions and all were answered. The patient agreed with the plan and demonstrated an understanding of the instructions. ?  ?The patient was advised to call back or seek an in-person evaluation if the symptoms worsen or if the condition fails to improve as anticipated. ? ?I provided 20 minutes of non-face-to-face time during this encounter. ? ? ?Libby Maw, MD  ?

## 2021-05-20 LAB — COVID-19, FLU A+B NAA
Influenza A, NAA: NOT DETECTED
Influenza B, NAA: NOT DETECTED
SARS-CoV-2, NAA: NOT DETECTED

## 2021-05-21 ENCOUNTER — Other Ambulatory Visit: Payer: Self-pay

## 2021-05-21 ENCOUNTER — Ambulatory Visit (INDEPENDENT_AMBULATORY_CARE_PROVIDER_SITE_OTHER): Payer: PPO | Admitting: Family Medicine

## 2021-05-21 VITALS — BP 120/90 | HR 88 | Temp 98.2°F | Ht 65.0 in | Wt 182.4 lb

## 2021-05-21 DIAGNOSIS — R1013 Epigastric pain: Secondary | ICD-10-CM

## 2021-05-21 DIAGNOSIS — K219 Gastro-esophageal reflux disease without esophagitis: Secondary | ICD-10-CM

## 2021-05-21 DIAGNOSIS — K529 Noninfective gastroenteritis and colitis, unspecified: Secondary | ICD-10-CM | POA: Diagnosis not present

## 2021-05-21 LAB — HEPATIC FUNCTION PANEL
AG Ratio: 1.9 (calc) (ref 1.0–2.5)
ALT: 17 U/L (ref 6–29)
AST: 18 U/L (ref 10–35)
Albumin: 4.3 g/dL (ref 3.6–5.1)
Alkaline phosphatase (APISO): 55 U/L (ref 37–153)
Bilirubin, Direct: 0.2 mg/dL (ref 0.0–0.2)
Globulin: 2.3 g/dL (calc) (ref 1.9–3.7)
Indirect Bilirubin: 0.9 mg/dL (calc) (ref 0.2–1.2)
Total Bilirubin: 1.1 mg/dL (ref 0.2–1.2)
Total Protein: 6.6 g/dL (ref 6.1–8.1)

## 2021-05-21 LAB — CBC
HCT: 42.5 % (ref 35.0–45.0)
Hemoglobin: 14.4 g/dL (ref 11.7–15.5)
MCH: 29.2 pg (ref 27.0–33.0)
MCHC: 33.9 g/dL (ref 32.0–36.0)
MCV: 86.2 fL (ref 80.0–100.0)
MPV: 9.8 fL (ref 7.5–12.5)
Platelets: 260 10*3/uL (ref 140–400)
RBC: 4.93 10*6/uL (ref 3.80–5.10)
RDW: 12.2 % (ref 11.0–15.0)
WBC: 5.2 10*3/uL (ref 3.8–10.8)

## 2021-05-21 LAB — AMYLASE: Amylase: 12 U/L — ABNORMAL LOW (ref 21–101)

## 2021-05-21 MED ORDER — OMEPRAZOLE 40 MG PO CPDR: 40.0000 mg | DELAYED_RELEASE_CAPSULE | Freq: Every day | ORAL | 3 refills | Status: DC

## 2021-05-21 NOTE — Progress Notes (Signed)
? ?Established Patient Office Visit ? ?Subjective:  ?Patient ID: Monique Rangel, female    DOB: 10/16/1955  Age: 66 y.o. MRN: [id number] ? ?CC:  ?Chief Complaint  ?Patient presents with  ? Follow-up  ?  Follow up stomach virus. Discomfort in the  upper stomach area.   ? ? ?HPI ?Monique Rangel presents for follow-up of her recent bout of gastroenteritis that seems to be resolving.  She has been left with mid epigastric discomfort.  She had vomited several times during the course she has had bouts mid epigastric pain in the last several months.  Ultrasound liver in May of last year was negative save infiltration.  Recent physical blood work ago.  She has been taking famotidine for reflux with less than response.  Increased stress.  Being married back yard 2 weeks and is pregnant. ? ?Past Medical History:  ?Diagnosis Date  ? Allergy   ? Anxiety 05/11/2016  ? ? ?Past Surgical History:  ?Procedure Laterality Date  ? EYE SURGERY  both cataracts  ? ? ?Family History  ?Problem Relation Age of Onset  ? Hearing loss Mother   ? Cancer Father   ? Diabetes Father   ? Arthritis Maternal Aunt   ? Vision loss Maternal Aunt   ? Breast cancer Neg Hx   ? ? ?Social History  ? ?Socioeconomic History  ? Marital status: Married  ?  Spouse name: Not on file  ? Number of children: 2  ? Years of education: Not on file  ? Highest education level: Not on file  ?Occupational History  ? Not on file  ?Tobacco Use  ? Smoking status: Never  ? Smokeless tobacco: Never  ?Vaping Use  ? Vaping Use: Never used  ?Substance and Sexual Activity  ? Alcohol use: Yes  ?  Alcohol/week: 5.0 standard drinks  ?  Types: 5 Glasses of wine per week  ?  Comment: usually with a meal  ? Drug use: Never  ? Sexual activity: Not Currently  ?  Birth control/protection: Post-menopausal  ?Other Topics Concern  ? Not on file  ?Social History Narrative  ? Not on file  ? ?Social Determinants of Health  ? ?Financial Resource Strain: Not on file  ?Food Insecurity: Not on file   ?Transportation Needs: Not on file  ?Physical Activity: Not on file  ?Stress: Not on file  ?Social Connections: Not on file  ?Intimate Partner Violence: Not on file  ? ? ?Outpatient Medications Prior to Visit  ?Medication Sig Dispense Refill  ? albuterol (VENTOLIN HFA) 108 (90 Base) MCG/ACT inhaler Inhale 1-2 puffs into the lungs every 6 (six) hours as needed for wheezing or shortness of breath. 8 g 0  ? diclofenac Sodium (VOLTAREN) 1 % GEL Apply a small grape sized amount to right knee every 6 hours as needed. 150 g 1  ? diphenhydrAMINE (BENADRYL) 25 MG tablet Take 25 mg by mouth at bedtime as needed.    ? DULoxetine (CYMBALTA) 20 MG capsule Take 1 capsule (20 mg total) by mouth daily. 90 capsule 3  ? fluticasone (FLONASE) 50 MCG/ACT nasal spray Place 2 sprays into both nostrils every morning.    ? glucosamine-chondroitin 500-400 MG tablet Take 1 tablet by mouth 3 (three) times daily.    ? ondansetron (ZOFRAN-ODT) 8 MG disintegrating tablet Take 1 tablet (8 mg total) by mouth every 8 (eight) hours as needed for nausea or vomiting. (Patient not taking: Reported on 05/21/2021) 20 tablet 0  ? ?No facility-administered  medications prior to visit.  ? ? ?Allergies  ?Allergen Reactions  ? Lamisil [Terbinafine] Rash  ? Quinine Other (See Comments)  ?  Mother had a bad reaction so does not want to use it ?Mother had a bad reaction so does not want to use it ?  ? ? ?ROS ?Review of Systems  ?Constitutional:  Negative for diaphoresis, fatigue, fever and unexpected weight change.  ?HENT: Negative.    ?Eyes:  Negative for photophobia and visual disturbance.  ?Respiratory: Negative.    ?Cardiovascular: Negative.   ?Gastrointestinal:  Positive for abdominal pain. Negative for anal bleeding, blood in stool, constipation, diarrhea, nausea, rectal pain and vomiting.  ?Endocrine: Negative for polyphagia and polyuria.  ?Genitourinary: Negative.   ?Neurological:  Negative for speech difficulty and weakness.  ? ?  ?Objective:  ?   ?Physical Exam ?Vitals and nursing note reviewed.  ?Constitutional:   ?   General: She is not in acute distress. ?   Appearance: Normal appearance. She is not ill-appearing, toxic-appearing or diaphoretic.  ?HENT:  ?   Head: Normocephalic and atraumatic.  ?   Right Ear: External ear normal.  ?   Left Ear: External ear normal.  ?   Mouth/Throat:  ?   Mouth: Mucous membranes are moist.  ?   Pharynx: Oropharynx is clear. No oropharyngeal exudate or posterior oropharyngeal erythema.  ?Eyes:  ?   General: No scleral icterus.    ?   Right eye: No discharge.     ?   Left eye: No discharge.  ?   Extraocular Movements: Extraocular movements intact.  ?   Conjunctiva/sclera: Conjunctivae normal.  ?   Pupils: Pupils are equal, round, and reactive to light.  ?Cardiovascular:  ?   Rate and Rhythm: Normal rate and regular rhythm.  ?Pulmonary:  ?   Effort: Pulmonary effort is normal.  ?   Breath sounds: Normal breath sounds.  ?Abdominal:  ?   General: Abdomen is flat. Bowel sounds are normal. There is no distension.  ?   Palpations: Abdomen is soft. There is no mass.  ?   Tenderness: There is no abdominal tenderness. There is no guarding or rebound.  ?   Hernia: No hernia is present.  ?Musculoskeletal:  ?   Cervical back: No rigidity or tenderness.  ?Lymphadenopathy:  ?   Cervical: No cervical adenopathy.  ?Skin: ?   General: Skin is warm and dry.  ?Neurological:  ?   General: No focal deficit present.  ?   Mental Status: She is alert and oriented to person, place, and time.  ?Psychiatric:     ?   Mood and Affect: Mood normal.     ?   Behavior: Behavior normal.  ? ? ?BP 120/90 (BP Location: Left Arm, Patient Position: Sitting, Cuff Size: Normal)   Pulse 88   Temp 98.2 ?F (36.8 ?C) (Temporal)   Ht '5\' 5"'$  (1.651 m)   Wt 182 lb 6.4 oz (82.7 kg)   SpO2 95%   BMI 30.35 kg/m?  ?Wt Readings from Last 3 Encounters:  ?05/21/21 182 lb 6.4 oz (82.7 kg)  ?05/19/21 190 lb (86.2 kg)  ?04/27/21 190 lb (86.2 kg)  ? ? ? ?There are no  preventive care reminders to display for this patient. ? ?There are no preventive care reminders to display for this patient. ? ?Lab Results  ?Component Value Date  ? TSH 1.69 04/14/2020  ? ?Lab Results  ?Component Value Date  ? WBC 6.5 04/28/2021  ?  HGB 13.0 04/28/2021  ? HCT 39.1 04/28/2021  ? MCV 87.4 04/28/2021  ? PLT 266.0 04/28/2021  ? ?Lab Results  ?Component Value Date  ? NA 140 04/28/2021  ? K 4.3 04/28/2021  ? CO2 33 (H) 04/28/2021  ? GLUCOSE 105 (H) 04/28/2021  ? BUN 17 04/28/2021  ? CREATININE 0.82 04/28/2021  ? BILITOT 1.0 04/28/2021  ? BILITOT 1.0 04/28/2021  ? ALKPHOS 66 04/28/2021  ? ALKPHOS 66 04/28/2021  ? AST 16 04/28/2021  ? AST 16 04/28/2021  ? ALT 10 04/28/2021  ? ALT 10 04/28/2021  ? PROT 6.8 04/28/2021  ? PROT 6.8 04/28/2021  ? ALBUMIN 4.3 04/28/2021  ? ALBUMIN 4.3 04/28/2021  ? CALCIUM 9.3 04/28/2021  ? GFR 74.68 04/28/2021  ? ?Lab Results  ?Component Value Date  ? CHOL 237 (H) 04/28/2021  ? ?Lab Results  ?Component Value Date  ? HDL 78.10 04/28/2021  ? ?Lab Results  ?Component Value Date  ? LDLCALC 134 (H) 04/28/2021  ? ?Lab Results  ?Component Value Date  ? TRIG 121.0 04/28/2021  ? ?Lab Results  ?Component Value Date  ? CHOLHDL 3 04/28/2021  ? ?No results found for: HGBA1C ? ?  ?Assessment & Plan:  ? ?Problem List Items Addressed This Visit   ? ?  ? Digestive  ? Gastroesophageal reflux disease  ? Relevant Medications  ? omeprazole (PRILOSEC) 40 MG capsule  ? Gastroenteritis - Primary  ? Relevant Orders  ? Amylase  ? CBC  ? Hepatic function panel  ?  ? Other  ? Epigastric pain  ? Relevant Orders  ? Amylase  ? CBC  ? Hepatic function panel  ? ? ?Meds ordered this encounter  ?Medications  ? omeprazole (PRILOSEC) 40 MG capsule  ?  Sig: Take 1 capsule (40 mg total) by mouth daily.  ?  Dispense:  30 capsule  ?  Refill:  3  ? ? ?Follow-up: Return in about 5 weeks (around 06/25/2021).  ? ? ?Libby Maw, MD ?

## 2021-05-24 ENCOUNTER — Telehealth: Payer: Self-pay | Admitting: Nurse Practitioner

## 2021-05-24 NOTE — Telephone Encounter (Signed)
Pt thought you called returned your call if so. ?

## 2021-05-25 ENCOUNTER — Other Ambulatory Visit: Payer: Self-pay

## 2021-05-25 ENCOUNTER — Ambulatory Visit (INDEPENDENT_AMBULATORY_CARE_PROVIDER_SITE_OTHER): Payer: PPO | Admitting: Family Medicine

## 2021-05-25 ENCOUNTER — Encounter: Payer: Self-pay | Admitting: Family Medicine

## 2021-05-25 VITALS — BP 122/74 | HR 63 | Temp 97.8°F | Ht 65.0 in | Wt 186.6 lb

## 2021-05-25 DIAGNOSIS — K5901 Slow transit constipation: Secondary | ICD-10-CM

## 2021-05-25 DIAGNOSIS — F411 Generalized anxiety disorder: Secondary | ICD-10-CM | POA: Diagnosis not present

## 2021-05-25 MED ORDER — DULOXETINE HCL 20 MG PO CPEP: 40.0000 mg | ORAL_CAPSULE | Freq: Every day | ORAL | 1 refills | Status: DC

## 2021-05-25 NOTE — Progress Notes (Signed)
? ?Established Patient Office Visit ? ?Subjective:  ?Patient ID: Monique Rangel, female    DOB: 1956-02-20  Age: 66 y.o. MRN: [id number] ? ?CC:  ?Chief Complaint  ?Patient presents with  ? Advice Only  ?  Discuss labs patient states still having GI issues. Little constipation.   ? ? ?HPI ?Monique Rangel presents for follow-up of GI bug.  Symptoms have passed.. Improved.  Prilosec is helping reflux.  Deals with constipation.  Daughter is to be married on the 23rd of this month.  Patient's mother is living in the house and there is some conflict.  Patient continues to exercise by playing tennis. ? ?Past Medical History:  ?Diagnosis Date  ? Allergy   ? Anxiety 05/11/2016  ? ? ?Past Surgical History:  ?Procedure Laterality Date  ? EYE SURGERY  both cataracts  ? ? ?Family History  ?Problem Relation Age of Onset  ? Hearing loss Mother   ? Cancer Father   ? Diabetes Father   ? Arthritis Maternal Aunt   ? Vision loss Maternal Aunt   ? Breast cancer Neg Hx   ? ? ?Social History  ? ?Socioeconomic History  ? Marital status: Married  ?  Spouse name: Not on file  ? Number of children: 2  ? Years of education: Not on file  ? Highest education level: Not on file  ?Occupational History  ? Not on file  ?Tobacco Use  ? Smoking status: Never  ? Smokeless tobacco: Never  ?Vaping Use  ? Vaping Use: Never used  ?Substance and Sexual Activity  ? Alcohol use: Yes  ?  Alcohol/week: 5.0 standard drinks  ?  Types: 5 Glasses of wine per week  ?  Comment: usually with a meal  ? Drug use: Never  ? Sexual activity: Not Currently  ?  Birth control/protection: Post-menopausal  ?Other Topics Concern  ? Not on file  ?Social History Narrative  ? Not on file  ? ?Social Determinants of Health  ? ?Financial Resource Strain: Not on file  ?Food Insecurity: Not on file  ?Transportation Needs: Not on file  ?Physical Activity: Not on file  ?Stress: Not on file  ?Social Connections: Not on file  ?Intimate Partner Violence: Not on file  ? ? ?Outpatient  Medications Prior to Visit  ?Medication Sig Dispense Refill  ? albuterol (VENTOLIN HFA) 108 (90 Base) MCG/ACT inhaler Inhale 1-2 puffs into the lungs every 6 (six) hours as needed for wheezing or shortness of breath. 8 g 0  ? diclofenac Sodium (VOLTAREN) 1 % GEL Apply a small grape sized amount to right knee every 6 hours as needed. 150 g 1  ? diphenhydrAMINE (BENADRYL) 25 MG tablet Take 25 mg by mouth at bedtime as needed.    ? fluticasone (FLONASE) 50 MCG/ACT nasal spray Place 2 sprays into both nostrils every morning.    ? glucosamine-chondroitin 500-400 MG tablet Take 1 tablet by mouth 3 (three) times daily.    ? omeprazole (PRILOSEC) 40 MG capsule Take 1 capsule (40 mg total) by mouth daily. 30 capsule 3  ? DULoxetine (CYMBALTA) 20 MG capsule Take 1 capsule (20 mg total) by mouth daily. 90 capsule 3  ? ondansetron (ZOFRAN-ODT) 8 MG disintegrating tablet Take 1 tablet (8 mg total) by mouth every 8 (eight) hours as needed for nausea or vomiting. (Patient not taking: Reported on 05/21/2021) 20 tablet 0  ? ?No facility-administered medications prior to visit.  ? ? ?Allergies  ?Allergen Reactions  ? Lamisil [Terbinafine]  Rash  ? Quinine Other (See Comments)  ?  Mother had a bad reaction so does not want to use it ?Mother had a bad reaction so does not want to use it ?  ? ? ?ROS ?Review of Systems  ?Constitutional:  Negative for chills, diaphoresis, fatigue, fever and unexpected weight change.  ?HENT: Negative.    ?Eyes:  Negative for photophobia and visual disturbance.  ?Respiratory: Negative.    ?Cardiovascular: Negative.   ?Gastrointestinal:  Positive for constipation. Negative for abdominal pain, anal bleeding, blood in stool, diarrhea, nausea, rectal pain and vomiting.  ?Genitourinary: Negative.   ?Psychiatric/Behavioral: Negative.    ? ?  ?Objective:  ?  ?Physical Exam ?Vitals and nursing note reviewed.  ?Constitutional:   ?   General: She is not in acute distress. ?   Appearance: Normal appearance. She is not  ill-appearing, toxic-appearing or diaphoretic.  ?HENT:  ?   Head: Normocephalic and atraumatic.  ?Eyes:  ?   General: No scleral icterus.    ?   Right eye: No discharge.     ?   Left eye: No discharge.  ?   Extraocular Movements: Extraocular movements intact.  ?   Conjunctiva/sclera: Conjunctivae normal.  ?Pulmonary:  ?   Effort: Pulmonary effort is normal.  ?Skin: ?   General: Skin is warm and dry.  ?Neurological:  ?   Mental Status: She is alert and oriented to person, place, and time.  ?Psychiatric:     ?   Mood and Affect: Mood normal.     ?   Behavior: Behavior normal.  ? ? ?BP 122/74 (BP Location: Right Arm, Patient Position: Sitting, Cuff Size: Normal)   Pulse 63   Temp 97.8 ?F (36.6 ?C) (Temporal)   Ht '5\' 5"'$  (1.651 m)   Wt 186 lb 9.6 oz (84.6 kg)   SpO2 98%   BMI 31.05 kg/m?  ?Wt Readings from Last 3 Encounters:  ?05/25/21 186 lb 9.6 oz (84.6 kg)  ?05/21/21 182 lb 6.4 oz (82.7 kg)  ?05/19/21 190 lb (86.2 kg)  ? ? ? ?There are no preventive care reminders to display for this patient. ? ?There are no preventive care reminders to display for this patient. ? ?Lab Results  ?Component Value Date  ? TSH 1.69 04/14/2020  ? ?Lab Results  ?Component Value Date  ? WBC 5.2 05/21/2021  ? HGB 14.4 05/21/2021  ? HCT 42.5 05/21/2021  ? MCV 86.2 05/21/2021  ? PLT 260 05/21/2021  ? ?Lab Results  ?Component Value Date  ? NA 140 04/28/2021  ? K 4.3 04/28/2021  ? CO2 33 (H) 04/28/2021  ? GLUCOSE 105 (H) 04/28/2021  ? BUN 17 04/28/2021  ? CREATININE 0.82 04/28/2021  ? BILITOT 1.1 05/21/2021  ? ALKPHOS 66 04/28/2021  ? ALKPHOS 66 04/28/2021  ? AST 18 05/21/2021  ? ALT 17 05/21/2021  ? PROT 6.6 05/21/2021  ? ALBUMIN 4.3 04/28/2021  ? ALBUMIN 4.3 04/28/2021  ? CALCIUM 9.3 04/28/2021  ? GFR 74.68 04/28/2021  ? ?Lab Results  ?Component Value Date  ? CHOL 237 (H) 04/28/2021  ? ?Lab Results  ?Component Value Date  ? HDL 78.10 04/28/2021  ? ?Lab Results  ?Component Value Date  ? LDLCALC 134 (H) 04/28/2021  ? ?Lab Results   ?Component Value Date  ? TRIG 121.0 04/28/2021  ? ?Lab Results  ?Component Value Date  ? CHOLHDL 3 04/28/2021  ? ?No results found for: HGBA1C ? ?  ?Assessment & Plan:  ? ?Problem List  Items Addressed This Visit   ? ?  ? Digestive  ? Slow transit constipation  ?  ? Other  ? GAD (generalized anxiety disorder) - Primary  ? Relevant Medications  ? DULoxetine (CYMBALTA) 20 MG capsule  ? ? ?Meds ordered this encounter  ?Medications  ? DULoxetine (CYMBALTA) 20 MG capsule  ?  Sig: Take 2 capsules (40 mg total) by mouth daily.  ?  Dispense:  180 capsule  ?  Refill:  1  ? ? ?Follow-up: Return in about 5 weeks (around 06/29/2021).  ?Discussed treatment of constipation.  She will avoid stimulant laxatives.  She has some MiraLAX at the home and will try that.  Fiber is great to try to as well as Colace.  Information was given on constipation.  We will increase duloxetine to 40 mg.  Follow-up in 5 weeks. ? ?Libby Maw, MD ?

## 2021-06-28 ENCOUNTER — Telehealth: Payer: Self-pay | Admitting: Nurse Practitioner

## 2021-06-28 NOTE — Telephone Encounter (Signed)
Pt is wanting to transfer her care from [facility] to Dr. Ethelene Hal. She feels she is getting older and wants to see a provider closer to her age. Let me know and I will give her a cb. [phone] ?

## 2021-06-28 NOTE — Telephone Encounter (Signed)
Spoke with pharmacist went over out of pocket cot for patient. Per pt she will pay this and pick up Rx today.  ?

## 2021-06-28 NOTE — Telephone Encounter (Signed)
Caller Name: Aruna Shishido ?Call back phone #: [phone] ? ?MEDICATION(S): DULoxetine (CYMBALTA) 20 MG capsule [[id number]]  ? ? ?Days of Med Remaining: she has one pill left for tonight. The pharmacist can not fill this until 07/05/21. She says she was told to bump her '20mg'$  up to '40mg'$ . She did and now she is out.  She saw Dr. Ethelene Hal and he filled this script for her and she is planning on transferring her care to Dr. Ethelene Hal.  ? ?Has the patient contacted their pharmacy (YES/NO)?  yes ?IF YES, when and what did the pharmacy advise? They can fill on 07/05/21. ?IF NO, request that the patient contact the pharmacy for the refills in the future.  ?           The pharmacy will send an electronic request (except for controlled medications). ? ?Preferred Pharmacy: [id number] - [address]  ?[place], [place] [address]  ?Phone:  [phone]  Fax:  [phone] ? ?~~~Please advise patient/caregiver to allow 2-3 business days to process RX refills. ? ?

## 2021-06-28 NOTE — Telephone Encounter (Signed)
I called pt and read her the message. She will call back and schedule. ?

## 2021-08-23 DIAGNOSIS — Z23 Encounter for immunization: Secondary | ICD-10-CM | POA: Diagnosis not present

## 2021-09-06 ENCOUNTER — Ambulatory Visit (INDEPENDENT_AMBULATORY_CARE_PROVIDER_SITE_OTHER): Payer: PPO | Admitting: Family Medicine

## 2021-09-06 ENCOUNTER — Telehealth: Payer: Self-pay | Admitting: Nurse Practitioner

## 2021-09-06 ENCOUNTER — Encounter: Payer: Self-pay | Admitting: Family Medicine

## 2021-09-06 VITALS — BP 128/84 | HR 76 | Temp 97.6°F | Ht 65.0 in | Wt 192.2 lb

## 2021-09-06 DIAGNOSIS — B001 Herpesviral vesicular dermatitis: Secondary | ICD-10-CM | POA: Insufficient documentation

## 2021-09-06 DIAGNOSIS — J069 Acute upper respiratory infection, unspecified: Secondary | ICD-10-CM | POA: Diagnosis not present

## 2021-09-06 DIAGNOSIS — R0602 Shortness of breath: Secondary | ICD-10-CM

## 2021-09-06 DIAGNOSIS — J4520 Mild intermittent asthma, uncomplicated: Secondary | ICD-10-CM

## 2021-09-06 MED ORDER — ALBUTEROL SULFATE HFA 108 (90 BASE) MCG/ACT IN AERS: 1.0000 | INHALATION_SPRAY | Freq: Four times a day (QID) | RESPIRATORY_TRACT | 0 refills | Status: DC | PRN

## 2021-09-06 MED ORDER — VALACYCLOVIR HCL 1 G PO TABS: 500.0000 mg | ORAL_TABLET | Freq: Two times a day (BID) | ORAL | 5 refills | Status: AC

## 2021-09-06 NOTE — Telephone Encounter (Signed)
Noted  

## 2021-09-07 LAB — SPECIMEN STATUS REPORT

## 2021-09-07 LAB — NOVEL CORONAVIRUS, NAA: SARS-CoV-2, NAA: NOT DETECTED

## 2021-09-13 ENCOUNTER — Telehealth: Payer: Self-pay | Admitting: Family Medicine

## 2021-09-13 NOTE — Telephone Encounter (Signed)
Pt received a phone call and missed the call so pt is returning the phone call again

## 2021-09-13 NOTE — Telephone Encounter (Signed)
Monique Rangel called pt about COVID result. Informing pt of neg results.

## 2021-09-21 ENCOUNTER — Encounter: Payer: Self-pay | Admitting: Family Medicine

## 2021-09-25 ENCOUNTER — Other Ambulatory Visit: Payer: Self-pay | Admitting: Family Medicine

## 2021-09-25 DIAGNOSIS — K219 Gastro-esophageal reflux disease without esophagitis: Secondary | ICD-10-CM

## 2021-11-05 NOTE — Telephone Encounter (Signed)
Na

## 2021-12-27 ENCOUNTER — Other Ambulatory Visit: Payer: Self-pay | Admitting: Family Medicine

## 2021-12-27 DIAGNOSIS — F411 Generalized anxiety disorder: Secondary | ICD-10-CM

## 2022-02-27 ENCOUNTER — Ambulatory Visit
Admission: EM | Admit: 2022-02-27 | Discharge: 2022-02-27 | Disposition: A | Discharge status: Home / Self Care | Payer: PPO | Attending: Emergency Medicine | Admitting: Emergency Medicine

## 2022-02-27 ENCOUNTER — Encounter: Payer: Self-pay | Admitting: Emergency Medicine

## 2022-02-27 DIAGNOSIS — J019 Acute sinusitis, unspecified: Secondary | ICD-10-CM | POA: Diagnosis not present

## 2022-02-27 DIAGNOSIS — B9689 Other specified bacterial agents as the cause of diseases classified elsewhere: Secondary | ICD-10-CM

## 2022-02-27 MED ORDER — AZITHROMYCIN 250 MG PO TABS: ORAL_TABLET | ORAL | 0 refills | Status: AC

## 2022-02-27 NOTE — ED Provider Notes (Signed)
[facility]    CSN: [id number] Arrival date & time: 02/27/22  1414    HISTORY   Chief Complaint  Patient presents with   Facial Pain   HPI Monique Rangel is a pleasant, 66 y.o. female who presents to urgent care today. Patient complains of sinus pressure and sinus pain for the past 2 days, states prior to onset of the symptoms she is experiencing runny nose and a dry cough.  Patient reports nasal congestion and a slight cough as well.  Patient states has been taking Robitussin without meaningful relief of her symptoms.  Reviewed, patient has been prescribed allergy medications in the past, states she currently takes Benadryl at night for an allergy to dust and uses Flonase every morning.  Patient states he is also been taking Advil Cold and Sinus for the past 2 days.  Patient denies known sick contacts, fever, body aches, chills, nausea, vomiting, diarrhea.  The history is provided by the patient.   Past Medical History:  Diagnosis Date   Allergy    Anxiety 05/11/2016   Patient Active Problem List   Diagnosis Date Noted   Fever blister 09/06/2021   SOB (shortness of breath) 09/06/2021   Slow transit constipation 05/25/2021   Gastroesophageal reflux disease 05/21/2021   Epigastric pain 05/21/2021   Gastroenteritis 05/21/2021   Lipoma of buttock 04/27/2021   Seborrheic keratosis 04/27/2021   Viral upper respiratory tract infection 02/10/2021   History of torn meniscus of left knee 11/26/2020   Primary osteoarthritis of left knee 11/26/2020   Chronic pain of both knees 05/12/2020   Onychomycosis 05/12/2020   GAD (generalized anxiety disorder) 04/14/2020   Asymptomatic postmenopausal estrogen deficiency 04/14/2020   DDD (degenerative disc disease), cervical 04/14/2020   Migraine 04/14/2020   Macular degeneration, bilateral 04/14/2020   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    Home Medications    Prior to Admission medications   Medication Sig Start Date End Date Taking? Authorizing Provider  albuterol (VENTOLIN HFA) 108 (90 Base) MCG/ACT inhaler Inhale 1-2 puffs into the lungs every 6 (six) hours as needed for wheezing or shortness of breath. 09/06/21  Yes Libby Maw, MD  diclofenac Sodium (VOLTAREN) 1 % GEL Apply a small grape sized amount to right knee every 6 hours as needed. 11/26/20  Yes Libby Maw, MD  diphenhydrAMINE (BENADRYL) 25 MG tablet Take 25 mg by mouth at bedtime as needed.   Yes [provider]  DULoxetine (CYMBALTA) 20 MG capsule TAKE TWO CAPSULES BY MOUTH DAILY 12/27/21  Yes Libby Maw, MD  fluticasone [facility]) 50 MCG/ACT nasal spray Place 2 sprays into both nostrils every morning.   Yes [provider]  glucosamine-chondroitin 500-400 MG tablet Take 1 tablet by mouth 3 (three) times daily.   Yes [provider]  omeprazole (PRILOSEC) 40 MG capsule TAKE ONE CAPSULE BY MOUTH DAILY 09/27/21  Yes Libby Maw, MD    Family History Family History  Problem Relation Age of Onset   Hearing loss Mother    Cancer Father    Diabetes Father    Arthritis Maternal Aunt    Vision loss Maternal Aunt    Breast cancer Neg Hx  Social History Social History   Tobacco Use   Smoking status: Never   Smokeless tobacco: Never  Vaping Use   Vaping Use: Never used  Substance Use Topics   Alcohol use: Yes    Alcohol/week: 5.0 standard drinks of alcohol    Types: 5 Glasses of wine per week    Comment: usually with a meal   Drug use: Never   Allergies   Lamisil [terbinafine] and Quinine  Review of Systems Review of Systems Pertinent findings revealed after performing a 14 point review of systems  has been noted in the history of present illness.  Physical Exam Triage Vital Signs ED Triage Vitals  Enc Vitals Group     BP 01/08/21 0827 (!) 147/82     Pulse Rate 01/08/21 0827 72     Resp 01/08/21 0827 18     Temp 01/08/21 0827 98.3 F (36.8 C)     Temp Source 01/08/21 0827 Oral     SpO2 01/08/21 0827 98 %     Weight --      Height --      Head Circumference --      Peak Flow --      Pain Score 01/08/21 0826 5     Pain Loc --      Pain Edu? --      Excl. in [place]? --   No data found.  Updated Vital Signs BP (!) 138/91 (BP Location: Left Arm)   Pulse 79   Temp 99 F (37.2 C) (Oral)   Resp 18   Ht '5\' 6"'$  (1.676 m)   Wt 195 lb (88.5 kg)   SpO2 96%   BMI 31.47 kg/m   Physical Exam Vitals and nursing note reviewed.  Constitutional:      General: She is not in acute distress.    Appearance: Normal appearance. She is not ill-appearing.  HENT:     Head: Normocephalic and atraumatic.     Salivary Glands: Right salivary gland is not diffusely enlarged or tender. Left salivary gland is not diffusely enlarged or tender.     Right Ear: Hearing, ear canal and external ear normal. No drainage. A middle ear effusion is present. There is no impacted cerumen. Tympanic membrane is not erythematous or bulging.     Left Ear: Hearing, ear canal and external ear normal. No drainage. A middle ear effusion is present. There is no impacted cerumen. Tympanic membrane is not erythematous or bulging.     Nose: Mucosal edema present. No nasal deformity, septal deviation, congestion or rhinorrhea.     Right Turbinates: Not enlarged, swollen or pale.     Left Turbinates: Not enlarged, swollen or pale.     Right Sinus: No maxillary sinus tenderness or frontal sinus tenderness.     Left Sinus: No maxillary sinus tenderness or frontal sinus tenderness.     Mouth/Throat:     Lips: Pink. No lesions.     Mouth: Mucous membranes are moist. No oral lesions.     Pharynx: Oropharynx is clear. Uvula  midline. No posterior oropharyngeal erythema or uvula swelling.     Tonsils: No tonsillar exudate. 0 on the right. 0 on the left.     Comments: Postnasal drip Eyes:     General: Lids are normal.        Right eye: No discharge.        Left eye: No discharge.     Extraocular Movements: Extraocular movements intact.  Conjunctiva/sclera: Conjunctivae normal.     Right eye: Right conjunctiva is not injected.     Left eye: Left conjunctiva is not injected.  Neck:     Trachea: Trachea and phonation normal.  Cardiovascular:     Rate and Rhythm: Normal rate and regular rhythm.     Pulses: Normal pulses.     Heart sounds: Normal heart sounds, S1 normal and S2 normal. No murmur heard.    No friction rub. No gallop.  Pulmonary:     Effort: Pulmonary effort is normal. No tachypnea, bradypnea, accessory muscle usage, prolonged expiration or respiratory distress.     Breath sounds: Normal breath sounds. No stridor, decreased air movement or transmitted upper airway sounds. No decreased breath sounds, wheezing, rhonchi or rales.  Chest:     Chest wall: No tenderness.  Musculoskeletal:        General: Normal range of motion.     Cervical back: Full passive range of motion without pain, normal range of motion and neck supple. Normal range of motion.  Lymphadenopathy:     Cervical: Cervical adenopathy present.     Right cervical: Superficial cervical adenopathy present.     Left cervical: Superficial cervical adenopathy present.  Skin:    General: Skin is warm and dry.     Findings: No erythema or rash.  Neurological:     General: No focal deficit present.     Mental Status: She is alert and oriented to person, place, and time.  Psychiatric:        Mood and Affect: Mood normal.        Behavior: Behavior normal.     Visual Acuity Right Eye Distance:   Left Eye Distance:   Bilateral Distance:    Right Eye Near:   Left Eye Near:    Bilateral Near:     UC Couse / Diagnostics /  Procedures:     Radiology No results found.  Procedures Procedures (including critical care time) EKG  Pending results:  Labs Reviewed - No data to display  Medications Ordered in UC: Medications - No data to display  UC Diagnoses / Final Clinical Impressions(s)   I have reviewed the triage vital signs and the nursing notes.  Pertinent labs & imaging results that were available during my care of the patient were reviewed by me and considered in my medical decision making (see chart for details).    Final diagnoses:  Acute bacterial sinusitis   Given the patient is already taking nonsteroidal anti-inflammatories, decongestant, nasal steroid and antihistamine, and the presence of purulent fluid behind both the ears without erythema or injection or retraction, I recommend that she begin Z-Pak for presumed bacterial sinusitis.  Patient advised to continue all the medication she is currently taking and to follow-up if no better in the next 3 to 5 days. Please see discharge instructions below for further details of plan of care as provided to patient. ED Prescriptions     Medication Sig Dispense Auth. Provider   azithromycin (ZITHROMAX) 250 MG tablet Take 2 tablets (500 mg total) by mouth daily for 1 day, THEN 1 tablet (250 mg total) daily for 4 days. 6 tablet Lynden Oxford Scales, PA-C      PDMP not reviewed this encounter.  Disposition Upon Discharge:  Condition: stable for discharge home Home: take medications as prescribed; routine discharge instructions as discussed; follow up as advised.  Patient presented with an acute illness with associated systemic symptoms and significant discomfort requiring urgent  management. In my opinion, this is a condition that a prudent lay person (someone who possesses an average knowledge of health and medicine) may potentially expect to result in complications if not addressed urgently such as respiratory distress, impairment of bodily function  or dysfunction of bodily organs.   Routine symptom specific, illness specific and/or disease specific instructions were discussed with the patient and/or caregiver at length.   As such, the patient has been evaluated and assessed, work-up was performed and treatment was provided in alignment with urgent care protocols and evidence based medicine.  Patient/parent/caregiver has been advised that the patient may require follow up for further testing and treatment if the symptoms continue in spite of treatment, as clinically indicated and appropriate.  If the patient was tested for COVID-19, Influenza and/or RSV, then the patient/parent/guardian was advised to isolate at home pending the results of his/her diagnostic coronavirus test and potentially longer if they're positive. I have also advised pt that if his/her COVID-19 test returns positive, it's recommended to self-isolate for at least 10 days after symptoms first appeared AND until fever-free for 24 hours without fever reducer AND other symptoms have improved or resolved. Discussed self-isolation recommendations as well as instructions for household member/close contacts as per the [facility] and [place] DHHS, and also gave patient the [place] packet with this information.  Patient/parent/caregiver has been advised to return to the [facility] or PCP in 3-5 days if no better; to PCP or the Emergency Department if new signs and symptoms develop, or if the current signs or symptoms continue to change or worsen for further workup, evaluation and treatment as clinically indicated and appropriate  The patient will follow up with their current PCP if and as advised. If the patient does not currently have a PCP we will assist them in obtaining one.   The patient may need specialty follow up if the symptoms continue, in spite of conservative treatment and management, for further workup, evaluation, consultation and treatment as clinically indicated and  appropriate.  Patient/parent/caregiver verbalized understanding and agreement of plan as discussed.  All questions were addressed during visit.  Please see discharge instructions below for further details of plan.  Discharge Instructions:   Discharge Instructions      Please continue Benadryl at bedtime, Flonase every morning and Advil Cold and Sinus as needed.  Please begin azithromycin by taking 2 tablets me pick up the prescription and 1 tablet daily thereafter.  Please follow-up in the next 3 to 5 days if your symptoms have not significantly improved.  Thank you for visiting urgent care today.      This office note has been dictated using Museum/gallery curator.  Unfortunately, this method of dictation can sometimes lead to typographical or grammatical errors.  I apologize for your inconvenience in advance if this occurs.  Please do not hesitate to reach out to me if clarification is needed.      Lynden Oxford Scales, PA-C 02/27/22 1528

## 2022-02-27 NOTE — Discharge Instructions (Signed)
Please continue Benadryl at bedtime, Flonase every morning and Advil Cold and Sinus as needed.  Please begin azithromycin by taking 2 tablets me pick up the prescription and 1 tablet daily thereafter.  Please follow-up in the next 3 to 5 days if your symptoms have not significantly improved.  Thank you for visiting urgent care today.

## 2022-02-27 NOTE — ED Triage Notes (Signed)
Patient c/o sinus pressure and pain x 1 day, history of sinus problems.  Patient does have congestion, slight cough.  Patient has taken Robitussin.

## 2022-03-24 ENCOUNTER — Telehealth: Payer: Self-pay | Admitting: Nurse Practitioner

## 2022-03-24 NOTE — Telephone Encounter (Signed)
Left message for patient to call back and schedule Medicare Annual Wellness Visit (AWV) in office.   If not able to come in office, please offer to do virtually or by telephone.  Left office number and my jabber [phone].  AWVI eligible as of 02/11/2022  Please schedule at anytime with Nurse Health Advisor.

## 2022-03-28 ENCOUNTER — Other Ambulatory Visit: Payer: Self-pay | Admitting: Family Medicine

## 2022-03-28 ENCOUNTER — Ambulatory Visit (INDEPENDENT_AMBULATORY_CARE_PROVIDER_SITE_OTHER): Payer: PPO

## 2022-03-28 VITALS — Ht 66.0 in | Wt 195.0 lb

## 2022-03-28 DIAGNOSIS — Z0000 Encounter for general adult medical examination without abnormal findings: Secondary | ICD-10-CM | POA: Diagnosis not present

## 2022-03-28 DIAGNOSIS — F411 Generalized anxiety disorder: Secondary | ICD-10-CM

## 2022-03-28 DIAGNOSIS — K219 Gastro-esophageal reflux disease without esophagitis: Secondary | ICD-10-CM

## 2022-03-28 NOTE — Progress Notes (Addendum)
Subjective:   Monique Rangel is a 67 y.o. female who presents for an Initial Medicare Annual Wellness Visit.  Review of Systems     Cardiac Risk Factors include: advanced age (>44mn, >>3women);obesity (BMI >30kg/m2)     Objective:    Today's Vitals   03/28/22 0956  Weight: 195 lb (88.5 kg)  Height: '5\' 6"'$  (1.676 m)   Body mass index is 31.47 kg/m.     03/28/2022   10:02 AM 06/03/2020    9:59 AM  Advanced Directives  Does Patient Have a Medical Advance Directive? Yes No  Type of AParamedicof ABruslyLiving will   Copy of H[place]ionin Chart? No - copy requested   Would patient like information on creating a medical advance directive?  No - Patient declined    Current Medications (verified) Outpatient Encounter Medications as of 03/28/2022  Medication Sig   albuterol (VENTOLIN HFA) 108 (90 Base) MCG/ACT inhaler Inhale 1-2 puffs into the lungs every 6 (six) hours as needed for wheezing or shortness of breath.   diclofenac Sodium (VOLTAREN) 1 % GEL Apply a small grape sized amount to right knee every 6 hours as needed.   diphenhydrAMINE (BENADRYL) 25 MG tablet Take 25 mg by mouth at bedtime as needed.   DULoxetine (CYMBALTA) 20 MG capsule TAKE 2 CAPSULES BY MOUTH DAILY   fluticasone (FLONASE) 50 MCG/ACT nasal spray Place 2 sprays into both nostrils every morning.   glucosamine-chondroitin 500-400 MG tablet Take 1 tablet by mouth 3 (three) times daily.   omeprazole (PRILOSEC) 40 MG capsule TAKE 1 CAPSULE BY MOUTH DAILY   No facility-administered encounter medications on file as of 03/28/2022.    Allergies (verified) Lamisil [terbinafine] and Quinine   History: Past Medical History:  Diagnosis Date   Allergy    Anxiety 05/11/2016   Past Surgical History:  Procedure Laterality Date   EYE SURGERY  both cataracts   Family History  Problem Relation Age of Onset   Hearing loss Mother    Cancer Father    Diabetes Father     Arthritis Maternal Aunt    Vision loss Maternal Aunt    Breast cancer Neg Hx    Social History   Socioeconomic History   Marital status: Married    Spouse name: Not on file   Number of children: 2   Years of education: Not on file   Highest education level: Not on file  Occupational History   Not on file  Tobacco Use   Smoking status: Never   Smokeless tobacco: Never  Vaping Use   Vaping Use: Never used  Substance and Sexual Activity   Alcohol use: Yes    Alcohol/week: 5.0 standard drinks of alcohol    Types: 5 Glasses of wine per week    Comment: usually with a meal   Drug use: Never   Sexual activity: Not Currently    Birth control/protection: Post-menopausal  Other Topics Concern   Not on file  Social History Narrative   Not on file   Social Determinants of Health   Financial Resource Strain: Low Risk  (03/28/2022)   Overall Financial Resource Strain (CARDIA)    Difficulty of Paying Living Expenses: Not hard at all  Food Insecurity: No Food Insecurity (03/28/2022)   Hunger Vital Sign    Worried About Running Out of Food in the Last Year: Never true    Ran Out of Food in the Last Year: Never true  Transportation Needs: No Transportation Needs (03/28/2022)   PRAPARE - Hydrologist (Medical): No    Lack of Transportation (Non-Medical): No  Physical Activity: Inactive (03/28/2022)   Exercise Vital Sign    Days of Exercise per Week: 0 days    Minutes of Exercise per Session: 0 min  Stress: No Stress Concern Present (03/28/2022)   [place]    Feeling of Stress : Not at all  Social Connections: Not on file    Tobacco Counseling Counseling given: Not Answered   Clinical Intake:  Pre-visit preparation completed: Yes  Pain : No/denies pain     Nutritional Status: BMI > 30  Obese Nutritional Risks: None Diabetes: No  How often do you need to have someone help you  when you read instructions, pamphlets, or other written materials from your doctor or pharmacy?: 1 - Never  Diabetic? no  Interpreter Needed?: No  Information entered by :: NAllen LPN   Activities of Daily Living    03/28/2022   10:04 AM  In your present state of health, do you have any difficulty performing the following activities:  Hearing? 1  Vision? 0  Difficulty concentrating or making decisions? 0  Walking or climbing stairs? 1  Dressing or bathing? 0  Doing errands, shopping? 0  Preparing Food and eating ? N  Using the Toilet? N  In the past six months, have you accidently leaked urine? N  Do you have problems with loss of bowel control? N  Managing your Medications? N  Managing your Finances? N  Housekeeping or managing your Housekeeping? N    Patient Care Team: Nche, Charlene Brooke, NP as PCP - General (Internal Medicine)  Indicate any recent Medical Services you may have received from other than [facility] providers in the past year (date may be approximate).     Assessment:   This is a routine wellness examination for Monique Rangel.  Hearing/Vision screen Vision Screening - Comments:: Regular eye exams, [facility]  Dietary issues and exercise activities discussed: Current Exercise Habits: The patient does not participate in regular exercise at present   Goals Addressed             This Visit's Progress    Patient Stated       03/28/2022, wants to cut down on carbs and lose weight       Depression Screen    03/28/2022   10:03 AM 05/25/2021    4:01 PM 05/21/2021    1:21 PM 05/19/2021   10:37 AM 04/27/2021   12:08 PM 04/27/2021   11:38 AM 11/26/2020    3:34 PM  PHQ 2/9 Scores  PHQ - 2 Score 0 0 0 0 0 0 0  PHQ- 9 Score     3 3     Fall Risk    03/28/2022   10:03 AM 05/25/2021    4:01 PM 05/21/2021    1:21 PM 05/19/2021   10:37 AM 11/26/2020    3:34 PM  [place] in the past year? 1 0 1 0 0  Comment tripped      Number falls in past yr: 0 0 1 0    Injury with Fall? 0  0    Risk for fall due to : Medication side effect      Follow up Falls prevention discussed;Falls evaluation completed;Education provided        FALL RISK PREVENTION PERTAINING  TO THE HOME:  Any stairs in or around the home? Yes  If so, are there any without handrails? No  Home free of loose throw rugs in walkways, pet beds, electrical cords, etc? Yes  Adequate lighting in your home to reduce risk of falls? Yes   ASSISTIVE DEVICES UTILIZED TO PREVENT FALLS:  Life alert? No  Use of a cane, walker or w/c? No  Grab bars in the bathroom? Yes  Shower chair or bench in shower? No  Elevated toilet seat or a handicapped toilet? No   TIMED UP AND GO:  Was the test performed? No .  Length of time to     Cognitive Function:        03/28/2022   10:06 AM  6CIT Screen  What Year? 0 points  What month? 0 points  What time? 0 points  Count back from 20 0 points  Months in reverse 0 points  Repeat phrase 0 points  Total Score 0 points    Immunizations Immunization History  Administered Date(s) Administered   Covid-19, Mrna,Vaccine(Spikevax)52yr and older 12/15/2021   Fluad Quad(high Dose 65+) 11/17/2021   Influenza Split 12/10/2015   Influenza,inj,Quad PF,6-35 Mos 11/27/2019   Influenza-Unspecified 12/10/2015, 11/23/2020   PFIZER(Purple Top)SARS-COV-2 Vaccination 05/20/2019, 06/10/2019, 12/22/2019, 06/21/2020, 11/30/2020   PNEUMOCOCCAL CONJUGATE-20 04/27/2021   RSV,unspecified 01/29/2022   Zoster Recombinat (Shingrix) 05/18/2020, 04/09/2021    TDAP status: Due, Education has been provided regarding the importance of this vaccine. Advised may receive this vaccine at local pharmacy or Health Dept. Aware to provide a copy of the vaccination record if obtained from local pharmacy or Health Dept. Verbalized acceptance and understanding.  Flu Vaccine status: Up to date  Pneumococcal vaccine status: Up to date  Covid-19 vaccine status: Completed  vaccines  Qualifies for Shingles Vaccine? Yes   Zostavax completed Yes   Shingrix Completed?: Yes  Screening Tests Health Maintenance  Topic Date Due   Medicare Annual Wellness (AWV)  Never done   DTaP/Tdap/Td (1 - Tdap) Never done   MAMMOGRAM  06/04/2022   COLONOSCOPY (Pts 45-454yrInsurance coverage will need to be confirmed)  10/18/2026   Pneumonia Vaccine 68Years old  Completed   INFLUENZA VACCINE  Completed   DEXA SCAN  Completed   Hepatitis C Screening  Completed   Zoster Vaccines- Shingrix  Completed   HPV VACCINES  Aged Out   COVID-19 Vaccine  Discontinued    Health Maintenance  Health Maintenance Due  Topic Date Due   Medicare Annual Wellness (AWV)  Never done   DTaP/Tdap/Td (1 - Tdap) Never done    Colorectal cancer screening: Type of screening: Colonoscopy. Completed 10/17/2016. Repeat every 10 years  Mammogram status: Completed 06/03/2020. Repeat every year  Bone Density status: Completed 01/12/2021.   Lung Cancer Screening: (Low Dose CT Chest recommended if Age 68-80ears, 30 pack-year currently smoking OR have quit w/in 15years.) does not qualify.   Lung Cancer Screening Referral: no  Additional Screening:  Hepatitis C Screening: does qualify; Completed 07/29/2016  Vision Screening: Recommended annual ophthalmology exams for early detection of glaucoma and other disorders of the eye. Is the patient up to date with their annual eye exam?  Yes  Who is the provider or what is the name of the office in which the patient attends annual eye exams? [facility] with a provider, would they like to be referred to a provider to establish care? No .   Dental Screening: Recommended  annual dental exams for proper oral hygiene  Community Resource Referral / Chronic Care Management: CRR required this visit?  No   CCM required this visit?  No      Plan:     I have personally reviewed and noted the following in the patient's chart:    Medical and social history Use of alcohol, tobacco or illicit drugs  Current medications and supplements including opioid prescriptions. Patient is not currently taking opioid prescriptions. Functional ability and status Nutritional status Physical activity Advanced directives List of other physicians Hospitalizations, surgeries, and ER visits in previous 12 months Vitals Screenings to include cognitive, depression, and falls Referrals and appointments  In addition, I have reviewed and discussed with patient certain preventive protocols, quality metrics, and best practice recommendations. A written personalized care plan for preventive services as well as general preventive health recommendations were provided to patient.     Kellie Simmering, LPN   6/81/2751   Nurse Notes: none  Due to this being a virtual visit, the after visit summary with patients personalized plan was offered to patient via mail or my-chart. Patient would like to access on my-chart

## 2022-03-28 NOTE — Patient Instructions (Signed)
Monique Rangel , Thank you for taking time to come for your Medicare Wellness Visit. I appreciate your ongoing commitment to your health goals. Please review the following plan we discussed and let me know if I can assist you in the future.   These are the goals we discussed:  Goals      Patient Stated     03/28/2022, wants to cut down on carbs and lose weight        This is a list of the screening recommended for you and due dates:  Health Maintenance  Topic Date Due   DTaP/Tdap/Td vaccine (1 - Tdap) Never done   Mammogram  06/04/2022   Medicare Annual Wellness Visit  03/29/2023   Colon Cancer Screening  10/18/2026   Pneumonia Vaccine  Completed   Flu Shot  Completed   DEXA scan (bone density measurement)  Completed   Hepatitis C Screening: USPSTF Recommendation to screen - Ages 17-79 yo.  Completed   Zoster (Shingles) Vaccine  Completed   HPV Vaccine  Aged Out   COVID-19 Vaccine  Discontinued    Advanced directives: Please bring a copy of your POA (Power of [place]) and/or Living Will to your next appointment.   Conditions/risks identified: none  Next appointment: Follow up in one year for your annual wellness visit    Preventive Care 65 Years and Older, Female Preventive care refers to lifestyle choices and visits with your health care provider that can promote health and wellness. What does preventive care include? A yearly physical exam. This is also called an annual well check. Dental exams once or twice a year. Routine eye exams. Ask your health care provider how often you should have your eyes checked. Personal lifestyle choices, including: Daily care of your teeth and gums. Regular physical activity. Eating a healthy diet. Avoiding tobacco and drug use. Limiting alcohol use. Practicing safe sex. Taking low-dose aspirin every day. Taking vitamin and mineral supplements as recommended by your health care provider. What happens during an annual well check? The  services and screenings done by your health care provider during your annual well check will depend on your age, overall health, lifestyle risk factors, and family history of disease. Counseling  Your health care provider may ask you questions about your: Alcohol use. Tobacco use. Drug use. Emotional well-being. Home and relationship well-being. Sexual activity. Eating habits. History of falls. Memory and ability to understand (cognition). Work and work Statistician. Reproductive health. Screening  You may have the following tests or measurements: Height, weight, and BMI. Blood pressure. Lipid and cholesterol levels. These may be checked every 5 years, or more frequently if you are over 41 years old. Skin check. Lung cancer screening. You may have this screening every year starting at age 62 if you have a 30-pack-year history of smoking and currently smoke or have quit within the past 15 years. Fecal occult blood test (FOBT) of the stool. You may have this test every year starting at age 65. Flexible sigmoidoscopy or colonoscopy. You may have a sigmoidoscopy every 5 years or a colonoscopy every 10 years starting at age 91. Hepatitis C blood test. Hepatitis B blood test. Sexually transmitted disease (STD) testing. Diabetes screening. This is done by checking your blood sugar (glucose) after you have not eaten for a while (fasting). You may have this done every 1-3 years. Bone density scan. This is done to screen for osteoporosis. You may have this done starting at age 14. Mammogram. This may be done  every 1-2 years. Talk to your health care provider about how often you should have regular mammograms. Talk with your health care provider about your test results, treatment options, and if necessary, the need for more tests. Vaccines  Your health care provider may recommend certain vaccines, such as: Influenza vaccine. This is recommended every year. Tetanus, diphtheria, and acellular  pertussis (Tdap, Td) vaccine. You may need a Td booster every 10 years. Zoster vaccine. You may need this after age 7. Pneumococcal 13-valent conjugate (PCV13) vaccine. One dose is recommended after age 61. Pneumococcal polysaccharide (PPSV23) vaccine. One dose is recommended after age 45. Talk to your health care provider about which screenings and vaccines you need and how often you need them. This information is not intended to replace advice given to you by your health care provider. Make sure you discuss any questions you have with your health care provider. Document Released: 03/27/2015 Document Revised: 11/18/2015 Document Reviewed: 12/30/2014 Elsevier Interactive Patient Education  2017 [place] Prevention in the Home Falls can cause injuries. They can happen to people of all ages. There are many things you can do to make your home safe and to help prevent falls. What can I do on the outside of my home? Regularly fix the edges of walkways and driveways and fix any cracks. Remove anything that might make you trip as you walk through a door, such as a raised step or threshold. Trim any bushes or trees on the path to your home. Use bright outdoor lighting. Clear any walking paths of anything that might make someone trip, such as rocks or tools. Regularly check to see if handrails are loose or broken. Make sure that both sides of any steps have handrails. Any raised decks and porches should have guardrails on the edges. Have any leaves, snow, or ice cleared regularly. Use sand or salt on walking paths during winter. Clean up any spills in your garage right away. This includes oil or grease spills. What can I do in the bathroom? Use night lights. Install grab bars by the toilet and in the tub and shower. Do not use towel bars as grab bars. Use non-skid mats or decals in the tub or shower. If you need to sit down in the shower, use a plastic, non-slip stool. Keep the floor  dry. Clean up any water that spills on the floor as soon as it happens. Remove soap buildup in the tub or shower regularly. Attach bath mats securely with double-sided non-slip rug tape. Do not have throw rugs and other things on the floor that can make you trip. What can I do in the bedroom? Use night lights. Make sure that you have a light by your bed that is easy to reach. Do not use any sheets or blankets that are too big for your bed. They should not hang down onto the floor. Have a firm chair that has side arms. You can use this for support while you get dressed. Do not have throw rugs and other things on the floor that can make you trip. What can I do in the kitchen? Clean up any spills right away. Avoid walking on wet floors. Keep items that you use a lot in easy-to-reach places. If you need to reach something above you, use a strong step stool that has a grab bar. Keep electrical cords out of the way. Do not use floor polish or wax that makes floors slippery. If you must use wax, use  non-skid floor wax. Do not have throw rugs and other things on the floor that can make you trip. What can I do with my stairs? Do not leave any items on the stairs. Make sure that there are handrails on both sides of the stairs and use them. Fix handrails that are broken or loose. Make sure that handrails are as long as the stairways. Check any carpeting to make sure that it is firmly attached to the stairs. Fix any carpet that is loose or worn. Avoid having throw rugs at the top or bottom of the stairs. If you do have throw rugs, attach them to the floor with carpet tape. Make sure that you have a light switch at the top of the stairs and the bottom of the stairs. If you do not have them, ask someone to add them for you. What else can I do to help prevent falls? Wear shoes that: Do not have high heels. Have rubber bottoms. Are comfortable and fit you well. Are closed at the toe. Do not wear  sandals. If you use a stepladder: Make sure that it is fully opened. Do not climb a closed stepladder. Make sure that both sides of the stepladder are locked into place. Ask someone to hold it for you, if possible. Clearly mark and make sure that you can see: Any grab bars or handrails. First and last steps. Where the edge of each step is. Use tools that help you move around (mobility aids) if they are needed. These include: Canes. Walkers. Scooters. Crutches. Turn on the lights when you go into a dark area. Replace any light bulbs as soon as they burn out. Set up your furniture so you have a clear path. Avoid moving your furniture around. If any of your floors are uneven, fix them. If there are any pets around you, be aware of where they are. Review your medicines with your doctor. Some medicines can make you feel dizzy. This can increase your chance of falling. Ask your doctor what other things that you can do to help prevent falls. This information is not intended to replace advice given to you by your health care provider. Make sure you discuss any questions you have with your health care provider. Document Released: 12/25/2008 Document Revised: 08/06/2015 Document Reviewed: 04/04/2014 Elsevier Interactive Patient Education  2017 [employer].

## 2022-03-31 NOTE — Addendum Note (Signed)
Addended by: Kellie Simmering on: 03/31/2022 08:05 AM   Modules accepted: Level of Service

## 2022-04-11 IMAGING — MG DIGITAL SCREENING BILAT W/ CAD
4 series · 4 of 4 positions shown · non-contrast
Comparison: Previous exam(s).

CLINICAL DATA: Screening.

EXAM:
DIGITAL SCREENING BILATERAL MAMMOGRAM WITH CAD
TECHNIQUE: Bilateral screening digital craniocaudal and mediolateral oblique
mammograms were obtained. The images were evaluated with
computer-aided detection.

[R MLO]
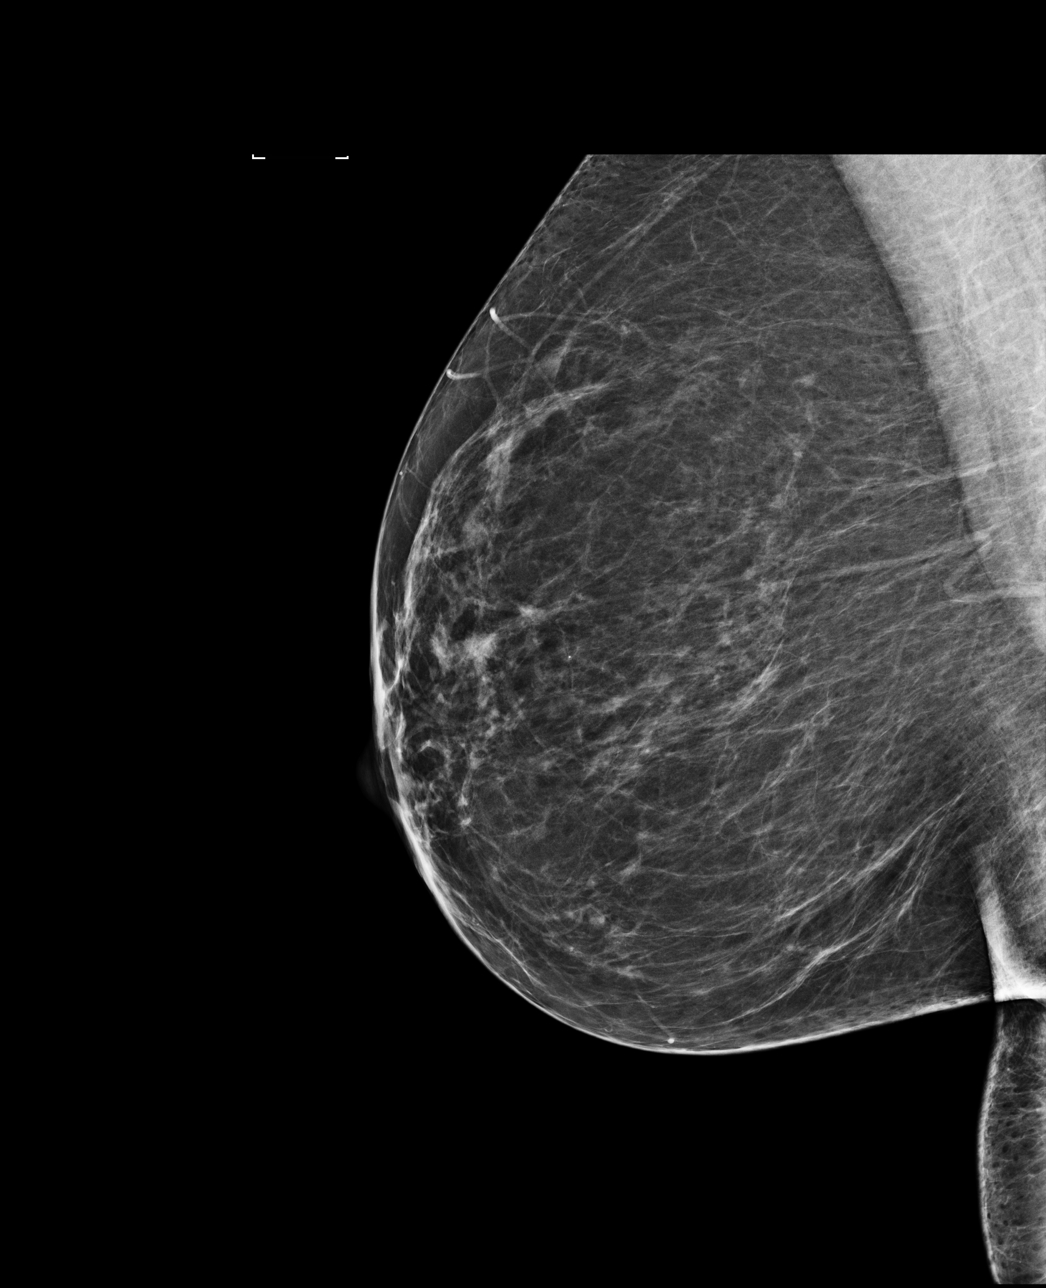

[R CC]
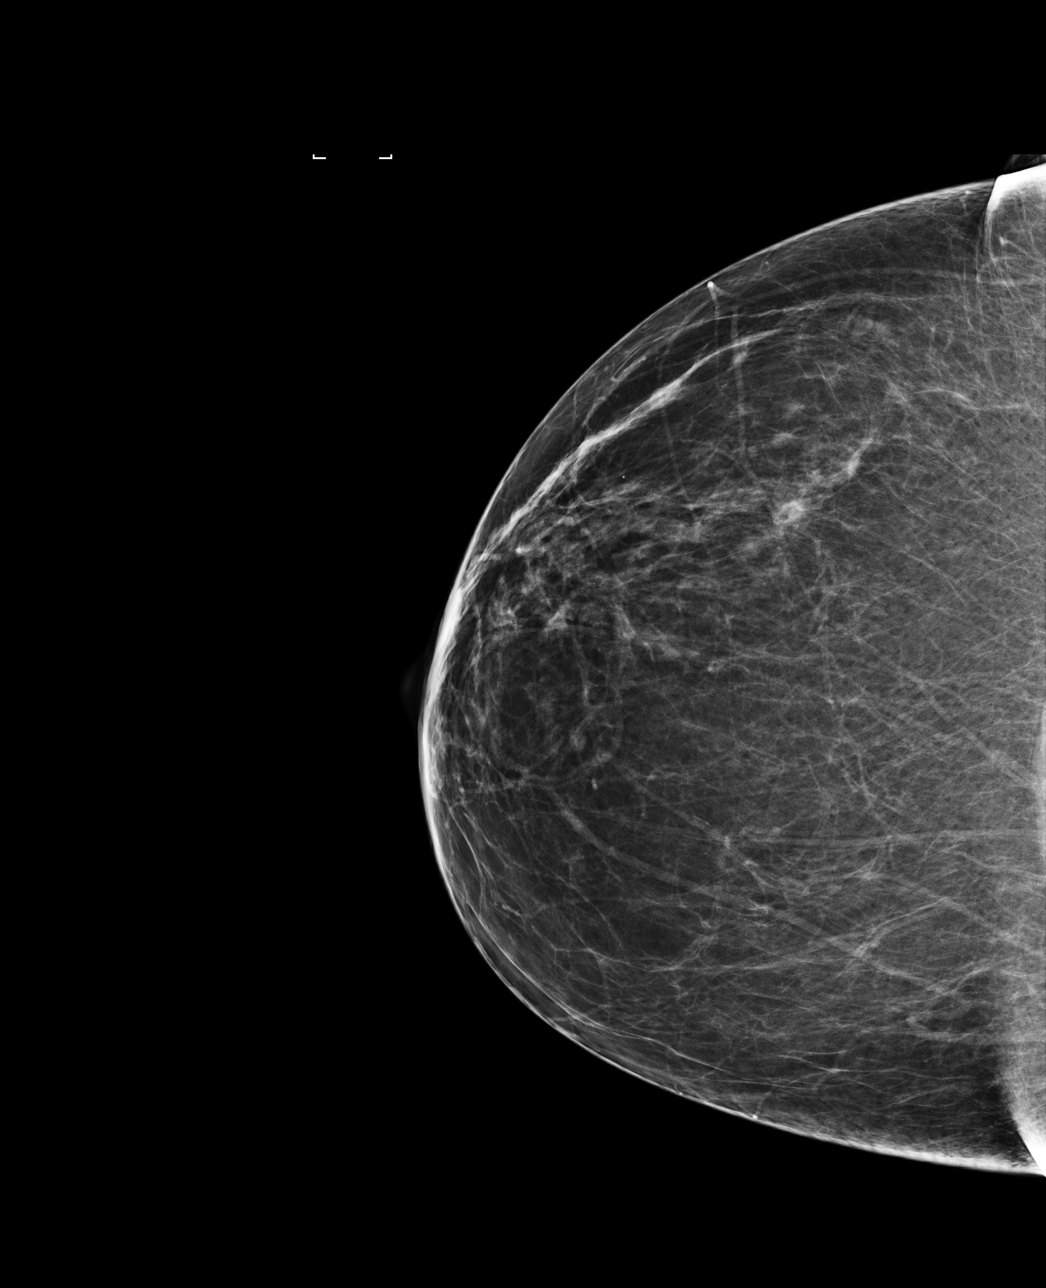

[L CC]
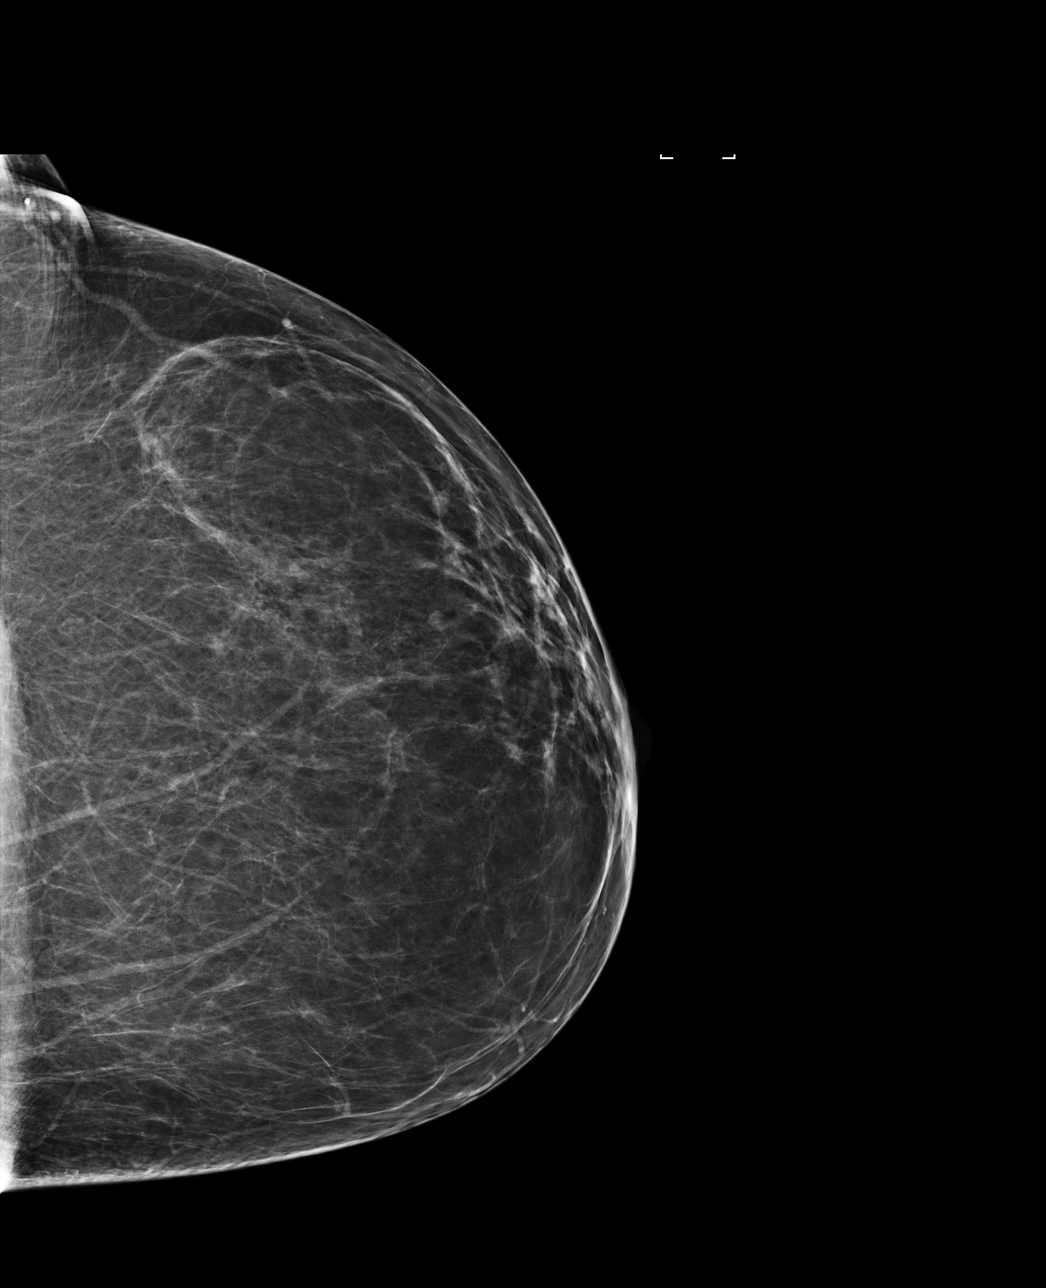

[L MLO]
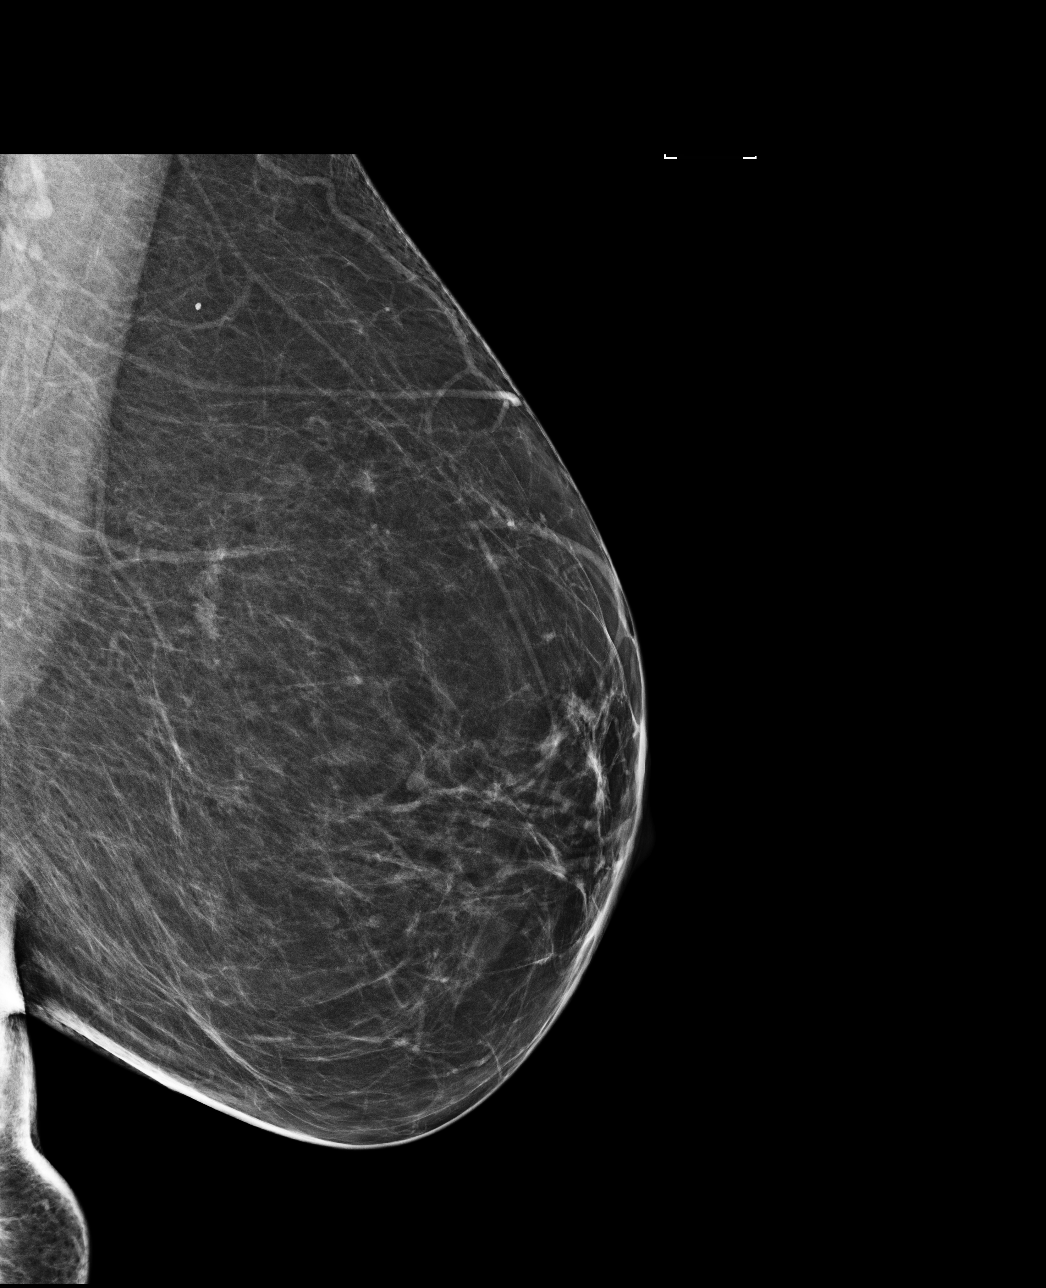

[4 of 4 positions shown; findings below may reference images not displayed]

ACR Breast Density Category b: There are scattered areas of
fibroglandular density.
FINDINGS: In the right breast, a possible asymmetry warrants further
evaluation. In the left breast, no findings suspicious for
malignancy.
IMPRESSION: Further evaluation is suggested for possible asymmetry in the right
breast.

RECOMMENDATION:
Diagnostic mammogram and possibly ultrasound of the right breast.
(Code:[id number])

The patient will be contacted regarding the findings, and additional
imaging will be scheduled.

BI-RADS CATEGORY  0: Incomplete. Need additional imaging evaluation
and/or prior mammograms for comparison.

## 2022-04-18 IMAGING — MG MM DIGITAL DIAGNOSTIC UNILAT*R* W/ TOMO W/ CAD
4 series · 4 of 12 positions shown · non-contrast
Comparison: Previous exam(s).

CLINICAL DATA: The patient was called back for a right breast
asymmetry.

EXAM:
DIGITAL DIAGNOSTIC UNILATERAL RIGHT MAMMOGRAM WITH TOMOSYNTHESIS AND
CAD
TECHNIQUE: Right digital diagnostic mammography and breast tomosynthesis was
performed. The images were evaluated with computer-aided detection.

[R MLO synth-2D]
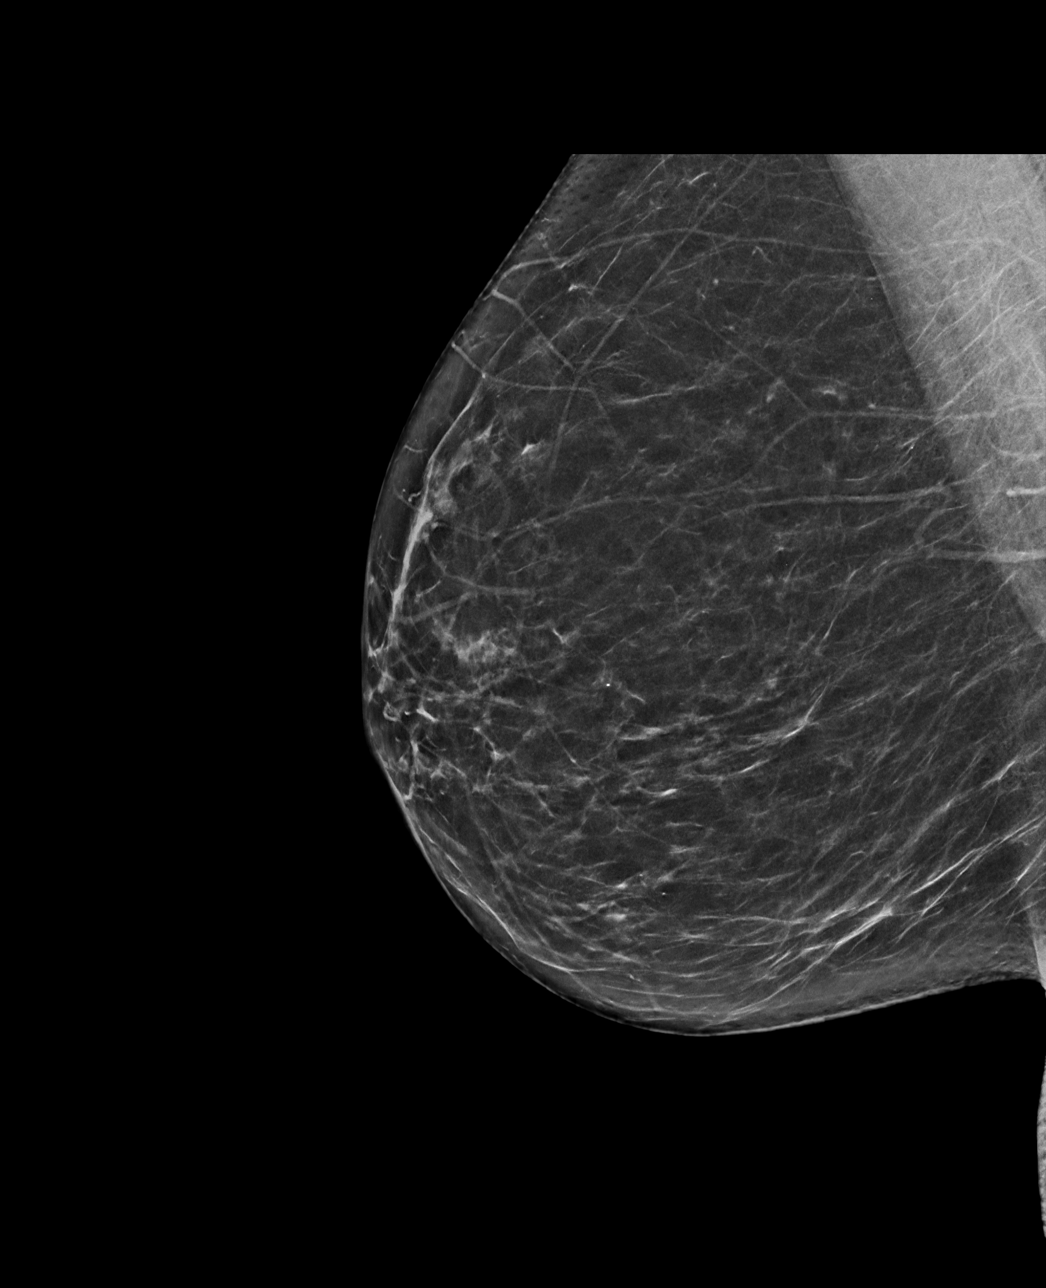

[R CC synth-2D]
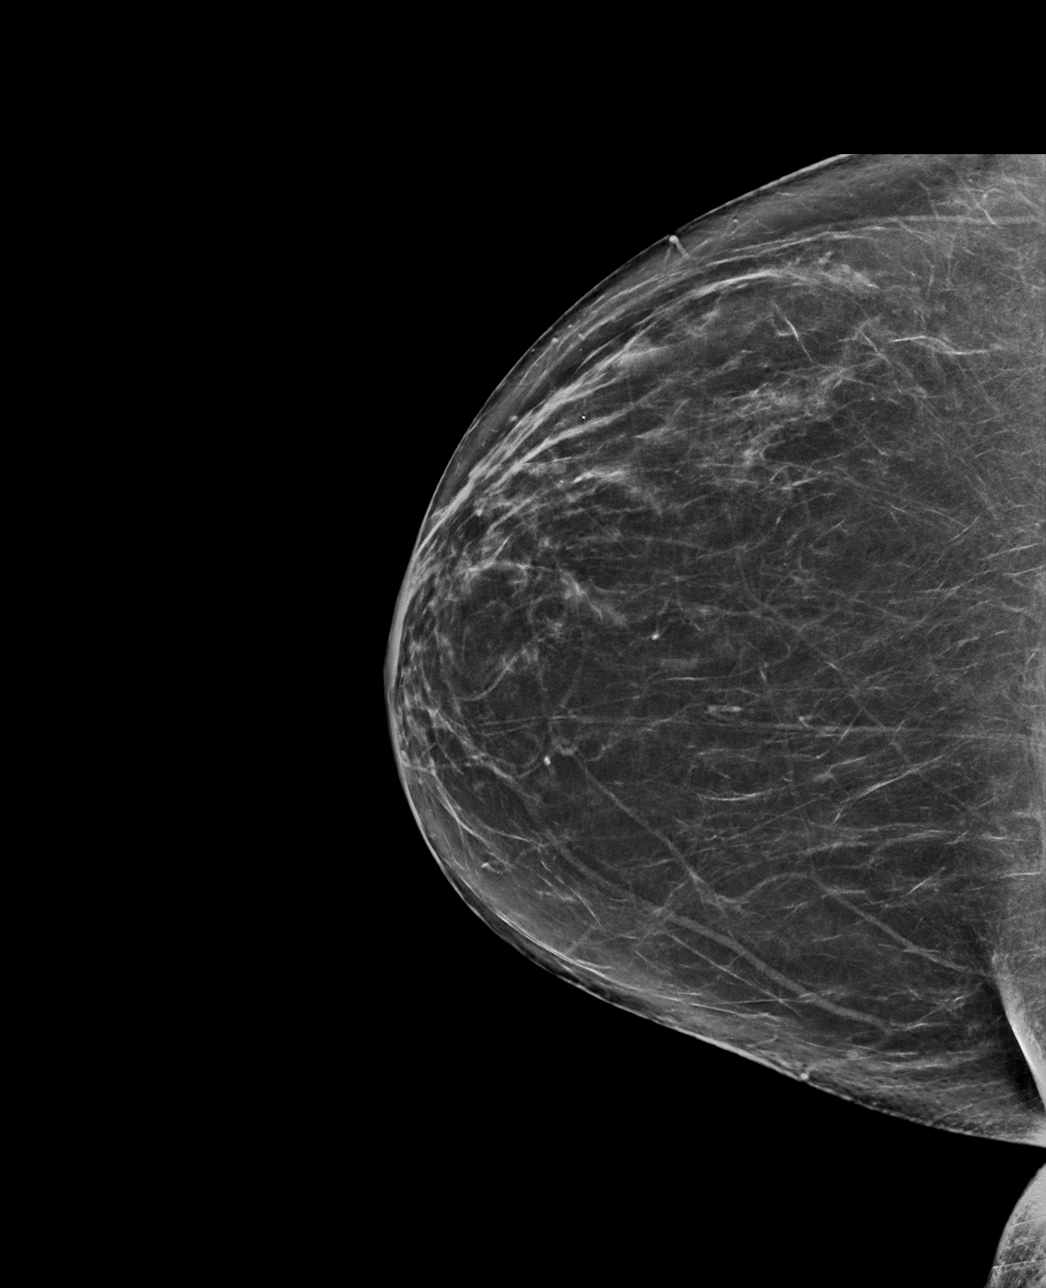

[R CC tomo · tomo slice 38/75.0]
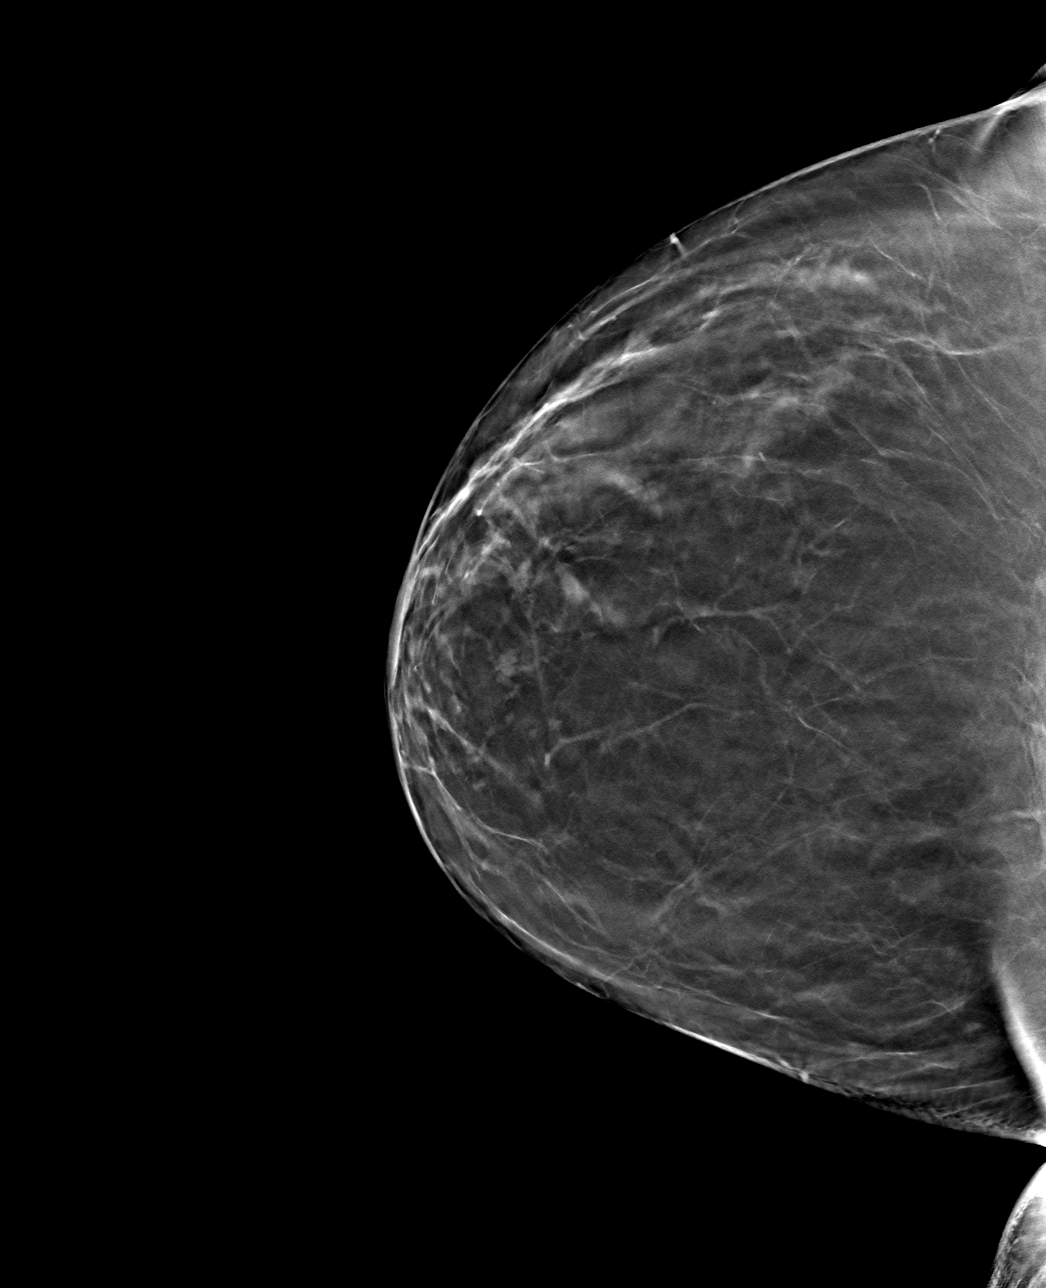

[R MLO tomo · tomo slice 37/73.0]
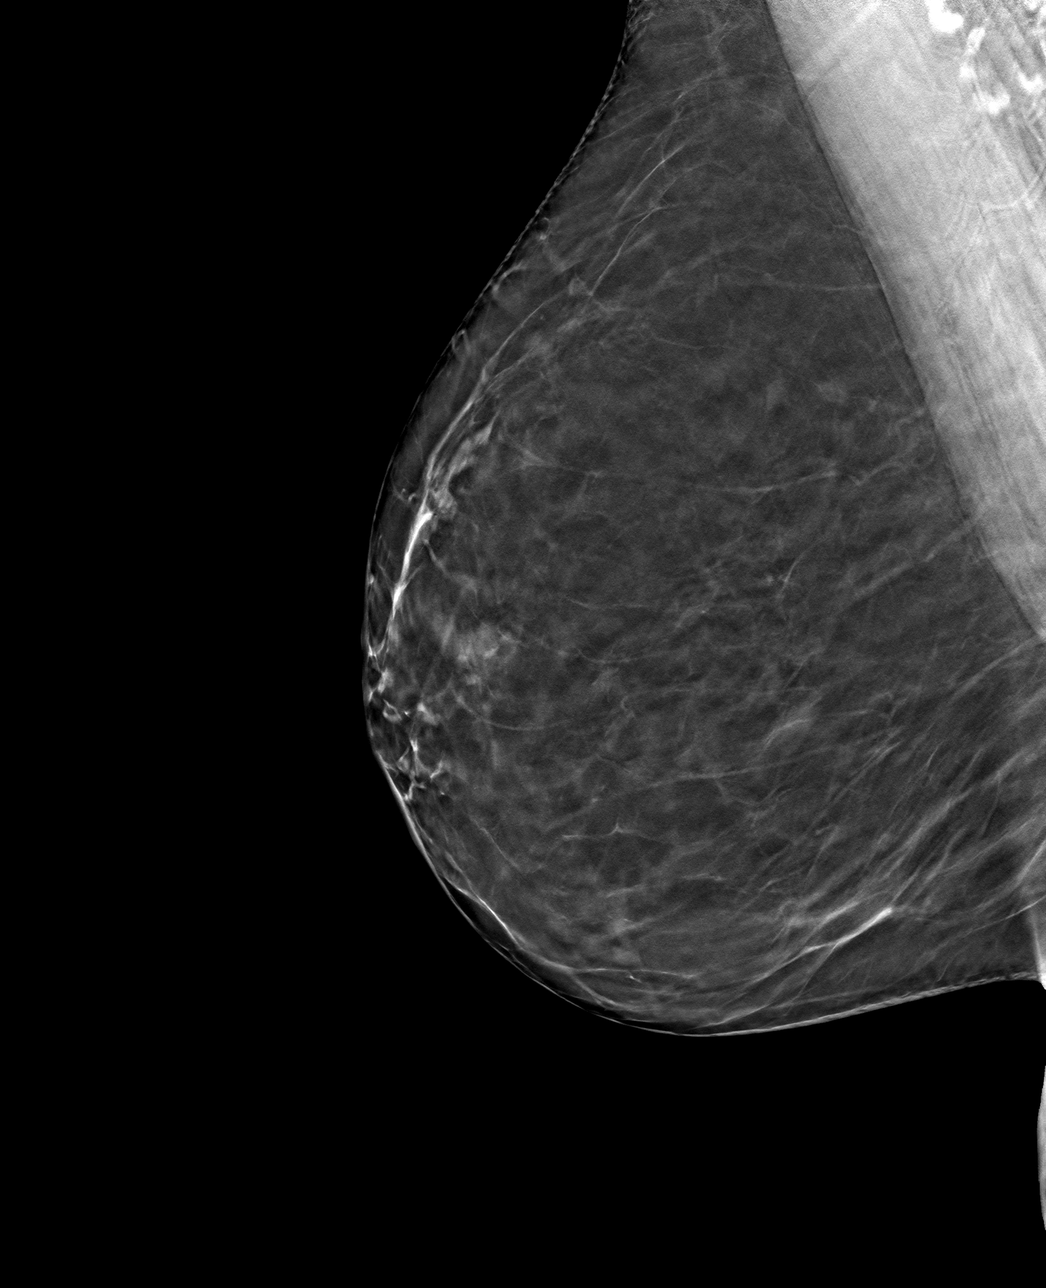

[4 of 12 positions shown; findings below may reference images not displayed]

ACR Breast Density Category b: There are scattered areas of
fibroglandular density.
FINDINGS: The right breast asymmetry resolves on additional imaging.
IMPRESSION: No mammographic evidence of malignancy in the right breast.

RECOMMENDATION:
Annual screening mammography.

I have discussed the findings and recommendations with the patient.
If applicable, a reminder letter will be sent to the patient
regarding the next appointment.

BI-RADS CATEGORY  1: Negative.

## 2022-06-10 ENCOUNTER — Other Ambulatory Visit: Payer: Self-pay | Admitting: Family Medicine

## 2022-06-10 DIAGNOSIS — F411 Generalized anxiety disorder: Secondary | ICD-10-CM

## 2022-06-17 ENCOUNTER — Telehealth: Payer: Self-pay | Admitting: Family Medicine

## 2022-06-17 DIAGNOSIS — J4520 Mild intermittent asthma, uncomplicated: Secondary | ICD-10-CM

## 2022-06-17 MED ORDER — ALBUTEROL SULFATE HFA 108 (90 BASE) MCG/ACT IN AERS
1.0000 | INHALATION_SPRAY | Freq: Four times a day (QID) | RESPIRATORY_TRACT | 0 refills | Status: DC | PRN
Start: 2022-06-17 — End: 2023-06-15

## 2022-06-17 NOTE — Telephone Encounter (Signed)
Prescription Request  06/17/2022  LOV: 09/06/2021  What is the name of the medication or equipment? albuterol (VENTOLIN HFA) 108 (90 Base) MCG/ACT inhaler [[id number]]   Have you contacted your pharmacy to request a refill? No   Which pharmacy would you like this sent to?  [id number] [place], [place] - [address] [place] [address] Phone: [phone] Fax: [phone]    Patient notified that their request is being sent to the clinical staff for review and that they should receive a response within 2 business days.   Please advise at Mobile [phone] (mobile)

## 2022-06-20 ENCOUNTER — Ambulatory Visit (INDEPENDENT_AMBULATORY_CARE_PROVIDER_SITE_OTHER): Payer: PPO | Admitting: Family Medicine

## 2022-06-20 ENCOUNTER — Encounter: Payer: Self-pay | Admitting: Family Medicine

## 2022-06-20 VITALS — BP 124/70 | HR 91 | Temp 98.0°F | Ht 66.0 in | Wt 199.8 lb

## 2022-06-20 DIAGNOSIS — I452 Bifascicular block: Secondary | ICD-10-CM | POA: Insufficient documentation

## 2022-06-20 DIAGNOSIS — R079 Chest pain, unspecified: Secondary | ICD-10-CM | POA: Diagnosis not present

## 2022-06-20 LAB — BRAIN NATRIURETIC PEPTIDE: Pro B Natriuretic peptide (BNP): 16 pg/mL (ref 0.0–100.0)

## 2022-06-20 NOTE — Assessment & Plan Note (Signed)
EKG shows some conduction abnormalities that could represent underlying heart disease. I will refer her to cardiology for further assessment.

## 2022-06-20 NOTE — Progress Notes (Signed)
[facility] [address] [place] [address] Dept: [phone] Dept Fax: [phone]  Office Visit  Subjective:    Patient ID: Monique Rangel, female    DOB: 1955-05-30, 67 y.o..   MRN: [id number]  Chief Complaint  Patient presents with   Shortness of Breath    C/o having SOB and heaviness on her chest 4 days.  Has gotten better with using inhaler.     History of Present Illness:  Patient is in today noting she had chest pain and shortness of breath on Thursday evening. She describes the chest pain as left sided and feeling like a heaviness or weight on her chest. She decided not to go to the ED, concerned about waiting too long. She denies any radiatio of the pain, diaphoresis, or nausea. She has a history of asthma. She used her albuterol inhaler and did feel some improvements. She is not having the pain at present. She has no history of significant heart disease and is not a smoker. She does have a history of mild, intermittent asthma.  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/20/2022   Fever blister 09/06/2021   SOB (shortness of breath) 09/06/2021   Slow transit constipation 05/25/2021   Gastroesophageal reflux disease 05/21/2021   Epigastric pain 05/21/2021   Gastroenteritis 05/21/2021   Lipoma of buttock 04/27/2021   Seborrheic keratosis 04/27/2021   Viral upper respiratory tract infection 02/10/2021   History of torn meniscus of left knee 11/26/2020   Primary osteoarthritis of left knee 11/26/2020   Chronic pain of both knees 05/12/2020   Onychomycosis 05/12/2020   GAD (generalized anxiety disorder) 04/14/2020   Asymptomatic postmenopausal estrogen deficiency 04/14/2020   DDD (degenerative disc disease), cervical 04/14/2020   Migraine 04/14/2020   Macular degeneration, bilateral  04/14/2020   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   Sensorineural hearing loss (SNHL) of both ears 02/15/2017   Mild intermittent asthma without complication 03/14/2000   Past Surgical History:  Procedure Laterality Date   EYE SURGERY  both cataracts   Family History  Problem Relation Age of Onset   Hearing loss Mother    Cancer Father    Diabetes Father    Arthritis Maternal Aunt    Vision loss Maternal Aunt    Breast cancer Neg Hx    Outpatient Medications Prior to Visit  Medication Sig Dispense Refill   albuterol (VENTOLIN HFA) 108 (90 Base) MCG/ACT inhaler Inhale 1-2 puffs into the lungs every 6 (six) hours as needed for wheezing or shortness of breath. 8 g 0   Biotin (BIOTIN MAXIMUM STRENGTH) 10 MG TABS Take by mouth.     diclofenac Sodium (VOLTAREN) 1 % GEL Apply a small grape sized amount to right knee every 6 hours as needed. 150 g 1   diphenhydrAMINE (BENADRYL) 25 MG tablet Take 25 mg by mouth at bedtime as needed.     DULoxetine (CYMBALTA) 20 MG capsule TAKE 2 CAPSULES BY MOUTH DAILY 180 capsule 0   fluticasone (FLONASE) 50 MCG/ACT nasal spray Place 2 sprays into both nostrils every morning.     glucosamine-chondroitin 500-400 MG tablet Take 1 tablet by mouth 3 (three) times daily.     Multiple Vitamin (MULTI-VITAMIN) tablet Take by mouth.     omeprazole (PRILOSEC) 40 MG capsule TAKE 1 CAPSULE BY MOUTH DAILY 90 capsule 1  No facility-administered medications prior to visit.   Allergies  Allergen Reactions   Lamisil [Terbinafine] Rash   Quinine Other (See Comments)    Mother had a bad reaction so does not want to use it Mother had a bad reaction so does not want to use it      Objective:   Today's Vitals   06/20/22 0909  BP: 124/70  Pulse: 91  Temp: 98 F (36.7 C)  TempSrc: Temporal  SpO2: 96%  Weight: 199 lb 12.8 oz (90.6 kg)  Height: 5\' 6"  (1.676 m)   Body mass index is 32.25 kg/m.   General: Well developed, well nourished. No  acute distress. Lungs: Clear to auscultation bilaterally. No wheezing, rales or rhonchi. CV: RRR without murmurs or rubs. Pulses 2+ bilaterally. Extremities: No edema noted. Psych: Alert and oriented. Normal mood and affect.  Health Maintenance Due  Topic Date Due   MAMMOGRAM  06/04/2022   EKG: Normal sinus rhythm (rate = 72). No acute ischemia or infarction noted. There is a left axis deviation and electrical pattern consistent with a left anterior hemiblock. The QRS is .10 with RSR' pattern in V1 consistent with an incomplete right bundle branch block.    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 I will refer her to cardiology for further assessment.      Other Visit Diagnoses     Chest pain, unspecified type    -  Primary   I doubt this is cardiac in origin, esp. since her albuterol reduced her pain. However, I will check a troponin and BNP in light of her EKG abnormalities.   Relevant Orders   Troponin I   EKG 12-Lead (Completed)   Ambulatory referral to Cardiology   B Nat Peptide       Return if symptoms worsen or fail to improve.   [name], MD

## 2022-06-21 LAB — TROPONIN I: Troponin I: <3 ng/L (ref <=47)

## 2022-07-06 ENCOUNTER — Other Ambulatory Visit: Payer: Self-pay | Admitting: Family Medicine

## 2022-07-06 DIAGNOSIS — Z1231 Encounter for screening mammogram for malignant neoplasm of breast: Secondary | ICD-10-CM

## 2022-07-18 ENCOUNTER — Ambulatory Visit: Payer: PPO | Attending: Cardiology | Admitting: Cardiology

## 2022-07-18 ENCOUNTER — Encounter: Payer: Self-pay | Admitting: Cardiology

## 2022-07-18 VITALS — BP 130/70 | HR 85 | Ht 66.0 in | Wt 198.6 lb

## 2022-07-18 DIAGNOSIS — R5382 Chronic fatigue, unspecified: Secondary | ICD-10-CM

## 2022-07-18 DIAGNOSIS — I452 Bifascicular block: Secondary | ICD-10-CM | POA: Diagnosis not present

## 2022-07-18 DIAGNOSIS — R079 Chest pain, unspecified: Secondary | ICD-10-CM | POA: Diagnosis not present

## 2022-07-18 DIAGNOSIS — R0602 Shortness of breath: Secondary | ICD-10-CM | POA: Diagnosis not present

## 2022-07-18 DIAGNOSIS — R5383 Other fatigue: Secondary | ICD-10-CM | POA: Insufficient documentation

## 2022-07-18 NOTE — Progress Notes (Addendum)
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  In and of itself RSR prime/incomplete right bundle branch block is not concerning, but LAFB may be more concerning.  Our EKG had a totally different axis.  Not sure which one of the EKGs is accurate.  We can reassess an EKG when she presents for echo. To evaluate for structural cardiac abnormality will check a 2D echo.  => No longer having any chest pain therefore I do not think we need to consider ischemic evaluation at this point.      Relevant Orders   EKG 12-Lead (Completed)   ECHOCARDIOGRAM COMPLETE (Completed)     Other   SOB (shortness of breath)    Just based on the presentation with some chest discomfort and fatigue, will check 2D echo in 2 assess for any change in EF or abnormal wall motion..      Relevant Orders   ECHOCARDIOGRAM COMPLETE (Completed)   Fatigue    56 diagnoses but this has not happened after an episode of chest pain release assess this patient is no evidence of any cardiac abnormalities.   It could be the fatigue is a downstream symptom of cardiomyopathy.  Checking 2D echocardiogram.      Relevant Orders   EKG 12-Lead (Completed)   ECHOCARDIOGRAM COMPLETE (Completed)   Chest pain of uncertain etiology    No longer having any more chest pain episodes.  1 short-lived episode of chest pain happening at rest is likely noncardiac in nature just based on the fact there is minimal  recurrence.  However she is noticing some fatigue since that episode.  Will start evaluation with 2D echocardiogram, if abnormal, would then consider proceeding with ischemic evaluation.      Relevant Orders   EKG 12-Lead (Completed)   ECHOCARDIOGRAM COMPLETE (Completed)   F/u EKG 08/15/2022: NSR 76 bpm  LAFB (-55 deg) (criteria for inc RBBB no longer present).    Bryan Lemma, MD   ===================================  HPI:    Monique Rangel is a 67 y.o. female non-smoker with a history of asthma who is being seen today for the evaluation of CHEST PAIN AND SHORTNESS OF BREATH at the request of [name], MD.  Treniya W Piano was seen on June 20, 2022 by Dr. Veto Kemps for chest pain and dyspnea prior Thursday evening on April 4.  Described as a heaviness/weight on her chest.  Mostly left-sided chest.  She did not go to the ER she did not want a wait too long.  No radiation.  No diaphoresis or nausea.  She is using albuterol inhaler with some improvement.  EKG showed incomplete right bundle branch block and anterior fascicular block and therefore BNP and troponin was ordered and she was referred to cardiology.  BNP and troponin were both normal.  Recent Hospitalizations: None  Reviewed  CV studies:    The following studies were reviewed today: (if available, images/films reviewed: From Epic Chart or Care Everywhere) None:  Interval History:  Monique Rangel presents here today for evaluation of her chest pain and shortness of breath episode.  She thinks that it all started shortly after she had been doing a lot of heavy gardening, she had to pick up a big heavy bag of mulch.  After that, she was noting some left-sided chest heaviness and tightness and felt like she just could not catch her breath.  The episode lasted about a minute encounter spontaneously resolved.  She thought maybe she was having some issues with her asthma so she used her albuterol but really did not get much help.  The  episode did not last very long and was not brought on by any particular activity at the time.  Currently, she denies any recurrent episodes of chest pressure or tightness.  She just noted feeling somewhat tired since then.  No real exertional dyspnea.  No PND, orthopnea, edema.  No palpitations or rapid heart rates.  No syncope or near syncope. She does note occasional right leg swelling and discomfort but not necessarily exertional.  She has some mild varicose veins and some aching in the legs bilaterally, but not overly worrisome to her.  She had really thought much episode of chest discomfort and dyspnea, nor the mild swelling but when she did go get checked by her PCP the EKG was read as abnormal and she was concerned results.  No syncope or near syncope, TIA/amaurosis fugax or claudication.  REVIEWED OF SYSTEMS   Review of Systems  Constitutional:  Positive for malaise/fatigue. Negative for chills, fever and weight loss.  HENT:  Negative for nosebleeds.   Respiratory:  Negative for shortness of breath.   Gastrointestinal:  Negative for blood in stool and melena.  Genitourinary:  Negative for hematuria.  Musculoskeletal:  Positive for joint pain.  Neurological:  Negative for dizziness, focal weakness and weakness.  Psychiatric/Behavioral:  Positive for depression (Controlled). The patient is nervous/anxious.     I have reviewed and (if needed) personally updated the patient's problem list, medications, allergies, past medical and surgical history, social and family history.   PAST MEDICAL HISTORY   Past Medical History:  Diagnosis Date   Allergy    Anxiety 05/11/2016    PAST SURGICAL HISTORY   Past Surgical History:  Procedure Laterality Date   EYE SURGERY  both cataracts    MEDICATIONS/ALLERGIES   Current Meds  Medication Sig   albuterol (VENTOLIN HFA) 108 (90 Base) MCG/ACT inhaler Inhale 1-2 puffs into the lungs every 6 (six) hours as needed for wheezing or shortness  of breath.   Biotin (BIOTIN MAXIMUM STRENGTH) 10 MG TABS Take by mouth.   diclofenac Sodium (VOLTAREN) 1 % GEL Apply a small grape sized amount to right knee every 6 hours as needed.   diphenhydrAMINE (BENADRYL) 25 MG tablet Take 25 mg by mouth at bedtime as needed.   DULoxetine (CYMBALTA) 20 MG capsule TAKE 2 CAPSULES BY MOUTH DAILY   fluticasone (FLONASE) 50 MCG/ACT nasal spray Place 2 sprays into both nostrils every morning.   glucosamine-chondroitin 500-400 MG tablet Take 1 tablet by mouth 3 (three) times daily.   Multiple Vitamin (MULTI-VITAMIN) tablet Take by mouth.   omeprazole (PRILOSEC) 40 MG capsule TAKE 1 CAPSULE BY MOUTH DAILY    Allergies  Allergen Reactions   Lamisil [Terbinafine] Rash   Quinine Other (See Comments)    Mother had a bad reaction so does not want to use it Mother had a bad reaction so does not want to use it  SOCIAL HISTORY/FAMILY HISTORY   Reviewed in Epic:   Social History   Tobacco Use   Smoking status: Never   Smokeless tobacco: Never  Vaping Use   Vaping Use: Never used  Substance Use Topics   Alcohol use: Yes    Alcohol/week: 5.0 standard drinks of alcohol    Types: 5 Glasses of wine per week    Comment: usually with a meal   Drug use: Never   Social History   Social History Narrative   Not on file   Family History  Problem Relation Age of Onset   Hearing loss Mother    Cancer Father    Diabetes Father    Arthritis Maternal Aunt    Vision loss Maternal Aunt    Breast cancer Neg Hx    PGF - CAD (smoker), PPM Father - lots of Cancer  - Agent Orange.   OBJCTIVE -PE, EKG, labs   Wt Readings from Last 3 Encounters:  08/15/22 200 lb 0.8 oz (90.7 kg)  07/18/22 198 lb 9.6 oz (90.1 kg)  06/20/22 199 lb 12.8 oz (90.6 kg)   Physical Exam: BP 130/70   Pulse 85   Ht 5\' 6"  (1.676 m)   Wt 198 lb 9.6 oz (90.1 kg)   SpO2 100%   BMI 32.05 kg/m  Physical Exam Vitals reviewed.  Constitutional:      General: She is not in acute  distress.    Appearance: Normal appearance. She is obese. She is not ill-appearing or toxic-appearing.  HENT:     Head: Normocephalic and atraumatic.  Neck:     Vascular: No carotid bruit or JVD.  Cardiovascular:     Rate and Rhythm: Normal rate and regular rhythm. No extrasystoles are present.    Chest Wall: PMI is not displaced.     Pulses: Normal pulses.     Heart sounds: Normal heart sounds, S1 normal and S2 normal. No murmur heard.    No friction rub. No gallop.  Pulmonary:     Effort: Pulmonary effort is normal. No respiratory distress.     Breath sounds: Normal breath sounds. No wheezing, rhonchi or rales.  Abdominal:     General: Abdomen is flat. Bowel sounds are normal. There is no distension.     Palpations: Abdomen is soft.     Tenderness: There is no abdominal tenderness.     Comments: No HSM or bruit.  Musculoskeletal:        General: No swelling. Normal range of motion.     Cervical back: Normal range of motion and neck supple.  Skin:    General: Skin is warm and dry.  Neurological:     General: No focal deficit present.     Mental Status: She is alert and oriented to person, place, and time.     Gait: Gait normal.  Psychiatric:        Mood and Affect: Mood normal.        Behavior: Behavior normal.        Thought Content: Thought content normal.        Judgment: Judgment normal.     Adult ECG Report 06/20/2022: Rate: 72;  Rhythm: normal sinus rhythm; RSR' in V1 and V2 suggestive of RV conduction delay (incomplete RBBB), LAFB (axis -55 )  Narrative Interpretation: Borderline EKG.  07/18/2022 (today): Rate: 85;  Rhythm: normal sinus rhythm; right superior axis-pulmonary pattern.  Narrative Interpretation: Compared to prior recording, incomplete RBBB and LAFB no longer present.  Axis  is now 251  -> suspect lead placement difference.   Recent Labs:  reviewed  Lab Results  Component Value Date   CHOL 237 (H) 04/28/2021   HDL 78.10 04/28/2021   LDLCALC 134 (H)  04/28/2021   TRIG 121.0 04/28/2021   CHOLHDL 3 04/28/2021   Lab Results  Component Value Date   CREATININE 0.82 04/28/2021   BUN 17 04/28/2021   NA 140 04/28/2021   K 4.3 04/28/2021   CL 104 04/28/2021   CO2 33 (H) 04/28/2021      Latest Ref Rng & Units 05/21/2021    1:56 PM 04/28/2021    8:50 AM 04/14/2020   10:07 AM  CBC  WBC 3.8 - 10.8 Thousand/uL 5.2  6.5  5.9   Hemoglobin 11.7 - 15.5 g/dL 16.1  09.6  04.5   Hematocrit 35.0 - 45.0 % 42.5  39.1  40.9   Platelets 140 - 400 Thousand/uL 260  266.0  239.0     No results found for: "HGBA1C" Lab Results  Component Value Date   TSH 1.69 04/14/2020    ================================================== I spent a total of 30 minutes with the patient spent in direct patient consultation.  Additional time spent with chart review  / charting (studies, outside notes, etc): 26 min Total Time: 56 min  Current medicines are reviewed at length with the patient today.  (+/- concerns) none  Notice: This dictation was prepared with Dragon dictation along with smart phrase technology. Any transcriptional errors that result from this process are unintentional and may not be corrected upon review.   Studies Ordered:  Orders Placed This Encounter  Procedures   EKG 12-Lead   ECHOCARDIOGRAM COMPLETE   No orders of the defined types were placed in this encounter.   Patient Instructions / Medication Changes & Studies & Tests Ordered   Patient Instructions  Medication Instructions:   No changes     Lab Work: Not needed    Testing/Procedures:  Will be schedule  [address] Your physician has requested that you have an echocardiogram. Echocardiography is a painless test that uses sound waves to create images of your heart. It provides your doctor with information about the size and shape of your heart and how well your heart's chambers and valves are working. This procedure takes approximately one hour. There  are no restrictions for this procedure. Please do NOT wear cologne, perfume, aftershave, or lotions (deodorant is allowed). Please arrive 15 minutes prior to your appointment time.   Follow-Up: At [facility], you and your health needs are our priority.  As part of our continuing mission to provide you with exceptional heart care, we have created designated Provider Care Teams.  These Care Teams include your primary Cardiologist (physician) and Advanced Practice Providers (APPs -  Physician Assistants and Nurse Practitioners) who all work together to provide you with the care you need, when you need it.     Your next appointment:   1 month(s)  The format for your next appointment:   Virtual Visit   Provider:   Bryan Lemma, MD    Other Instructions  Your physician recommends that you schedule a follow-up appointment  Nurse visit at [facility] street office same day of Echo  - Needs EKG Only       Marykay Lex, MD, MS Bryan Lemma, M.D., M.S. Interventional Cardiologist  [facility]  Pager # [phone] Phone # [phone] [address] [place], [place] [address]  Thank you for choosing [facility] [place]!!

## 2022-07-18 NOTE — Patient Instructions (Addendum)
Medication Instructions:   No changes     Lab Work: Not needed    Testing/Procedures:  Will be schedule  [address] Your physician has requested that you have an echocardiogram. Echocardiography is a painless test that uses sound waves to create images of your heart. It provides your doctor with information about the size and shape of your heart and how well your heart's chambers and valves are working. This procedure takes approximately one hour. There are no restrictions for this procedure. Please do NOT wear cologne, perfume, aftershave, or lotions (deodorant is allowed). Please arrive 15 minutes prior to your appointment time.   Follow-Up: At [facility], you and your health needs are our priority.  As part of our continuing mission to provide you with exceptional heart care, we have created designated Provider Care Teams.  These Care Teams include your primary Cardiologist (physician) and Advanced Practice Providers (APPs -  Physician Assistants and Nurse Practitioners) who all work together to provide you with the care you need, when you need it.     Your next appointment:   1 month(s)  The format for your next appointment:   Virtual Visit   Provider:   Bryan Lemma, MD    Other Instructions  Your physician recommends that you schedule a follow-up appointment  Nurse visit at [facility] street office same day of Echo  - Needs EKG Only

## 2022-07-23 ENCOUNTER — Encounter: Payer: Self-pay | Admitting: Cardiology

## 2022-07-23 NOTE — Assessment & Plan Note (Signed)
56 diagnoses but this has not happened after an episode of chest pain release assess this patient is no evidence of any cardiac abnormalities.   It could be the fatigue is a downstream symptom of cardiomyopathy.  Checking 2D echocardiogram.

## 2022-07-23 NOTE — Assessment & Plan Note (Signed)
Just based on the presentation with some chest discomfort and fatigue, will check 2D echo in 2 assess for any change in EF or abnormal wall motion.Marland Kitchen

## 2022-07-23 NOTE — Assessment & Plan Note (Addendum)
Interesting that the findings on PCPs EKG in our EKG are somewhat different.  In and of itself RSR prime/incomplete right bundle branch block is not concerning, but LAFB may be more concerning.  Our EKG had a totally different axis.  Not sure which one of the EKGs is accurate.  We can reassess an EKG when she presents for echo. To evaluate for structural cardiac abnormality will check a 2D echo.  => No longer having any chest pain therefore I do not think we need to consider ischemic evaluation at this point.

## 2022-07-23 NOTE — Assessment & Plan Note (Signed)
No longer having any more chest pain episodes.  1 short-lived episode of chest pain happening at rest is likely noncardiac in nature just based on the fact there is minimal recurrence.  However she is noticing some fatigue since that episode.  Will start evaluation with 2D echocardiogram, if abnormal, would then consider proceeding with ischemic evaluation.

## 2022-07-27 ENCOUNTER — Telehealth: Payer: Self-pay

## 2022-07-27 NOTE — Telephone Encounter (Signed)
Called pt to confirm that she wants to be scheduled for mobile mammo in [place] on 5/22. If not, we can get her scheduled for [facility] mobile mammo on 5/20.

## 2022-08-01 ENCOUNTER — Inpatient Hospital Stay: Admission: RE | Admit: 2022-08-01 | Payer: PPO | Source: Ambulatory Visit

## 2022-08-01 ENCOUNTER — Ambulatory Visit
Admission: RE | Admit: 2022-08-01 | Discharge: 2022-08-01 | Disposition: A | Discharge status: Home / Self Care | Payer: PPO | Source: Ambulatory Visit | Attending: Family Medicine | Admitting: Family Medicine

## 2022-08-01 DIAGNOSIS — Z1231 Encounter for screening mammogram for malignant neoplasm of breast: Secondary | ICD-10-CM | POA: Diagnosis not present

## 2022-08-03 ENCOUNTER — Inpatient Hospital Stay: Admission: RE | Admit: 2022-08-03 | Payer: PPO | Source: Ambulatory Visit

## 2022-08-15 ENCOUNTER — Ambulatory Visit (HOSPITAL_BASED_OUTPATIENT_CLINIC_OR_DEPARTMENT_OTHER): Payer: PPO

## 2022-08-15 ENCOUNTER — Ambulatory Visit (HOSPITAL_COMMUNITY): Payer: PPO | Attending: Cardiovascular Disease | Admitting: *Deleted

## 2022-08-15 VITALS — BP 126/78 | HR 85 | Resp 20 | Wt 200.1 lb

## 2022-08-15 DIAGNOSIS — R0602 Shortness of breath: Secondary | ICD-10-CM

## 2022-08-15 DIAGNOSIS — I452 Bifascicular block: Secondary | ICD-10-CM | POA: Insufficient documentation

## 2022-08-15 DIAGNOSIS — R9431 Abnormal electrocardiogram [ECG] [EKG]: Secondary | ICD-10-CM

## 2022-08-15 DIAGNOSIS — R5382 Chronic fatigue, unspecified: Secondary | ICD-10-CM

## 2022-08-15 DIAGNOSIS — R079 Chest pain, unspecified: Secondary | ICD-10-CM | POA: Diagnosis not present

## 2022-08-15 DIAGNOSIS — I77810 Thoracic aortic ectasia: Secondary | ICD-10-CM | POA: Insufficient documentation

## 2022-08-15 LAB — ECHOCARDIOGRAM COMPLETE
Area-P 1/2: 2.96 cm2
S' Lateral: 1.7 cm

## 2022-08-15 NOTE — Progress Notes (Signed)
   Nurse Visit   Date of Encounter: 08/15/2022 ID: Monique Rangel, DOB 12/11/1955, MRN [id number]  PCP:  Mliss Sax, MD   [facility] Providers Cardiologist:  Bryan Lemma, MD {   Visit Details   VS:  BP 126/78 (BP Location: Left Arm)   Pulse 85   Resp 20   Wt 200 lb 0.8 oz (90.7 kg)   SpO2 96% Comment: Pt wearing nail polish  BMI 32.29 kg/m  , BMI Body mass index is 32.29 kg/m.  Wt Readings from Last 3 Encounters:  08/15/22 200 lb 0.8 oz (90.7 kg)  07/18/22 198 lb 9.6 oz (90.1 kg)  06/20/22 199 lb 12.8 oz (90.6 kg)     Reason for visit: RN Visit for EKG  and Echocardiogram Performed today:  WT, Vitals, EKG, Provider consulted:DOD, Dr. Dayle Points, and Education Changes (medications, testing, etc.) : None  Length of Visit: 30 minutes    Medications Adjustments/Labs and Tests Ordered: Patient arrived at [facility] on [address] at 300 pm.  Pt weight obtained and taken to Pod C room 3.   After vitals signs obtained, and were within normal limits, EKG obtained.  Pt denied any symptoms during this visit. Pt advised she will also be having an Echocardiogram at [facility] after the RN / EKG visit.  EKG read: Normal sinus rhythm; Left anterior fascicular block; Abnormal EKG;  Vent Rate: 76 bpm, PR interval: 178 ms, QRS duration 96 ms, QT/Qtc: 418 / 470 ms, P-R-T axes: 47 -55 58.  EKG taken to DOD at [facility] street, Dr. Dayle Points.  Dr. Excell Seltzer reviewed today's EKG vs EKG from 07/18/2022, and appeared to be very similar.  No new orders from Dr. Excell Seltzer, other than forward the EKG to Dr. Bryan Lemma for review and follow up care.  I will have the signed EKG scanned into EPIC this afternoon for Dr. Elissa Hefty review, 08/15/2022.          Signed, Elmore Guise, RN  08/15/2022 3:43 PM

## 2022-08-18 ENCOUNTER — Other Ambulatory Visit (HOSPITAL_COMMUNITY): Payer: PPO

## 2022-08-19 ENCOUNTER — Telehealth (INDEPENDENT_AMBULATORY_CARE_PROVIDER_SITE_OTHER): Payer: PPO | Admitting: Family Medicine

## 2022-08-19 DIAGNOSIS — M009 Pyogenic arthritis, unspecified: Secondary | ICD-10-CM

## 2022-08-19 MED ORDER — CLINDAMYCIN HCL 300 MG PO CAPS
300.0000 mg | ORAL_CAPSULE | Freq: Three times a day (TID) | ORAL | 0 refills | Status: AC
Start: 2022-08-19 — End: 2022-09-09

## 2022-08-19 NOTE — Progress Notes (Signed)
Virtual Visit via Video Note  I connected with Monique Rangel on 08/19/2022 at  4:00 PM EDT by a video enabled telemedicine application and verified that I am speaking with the correct person using two identifiers.  Location: Patient: home Provider: office   I discussed the limitations of evaluation and management by telemedicine and the availability of in person appointments. The patient expressed understanding and agreed to proceed.  History of Present Illness:  Chief Complaint  Patient presents with   Bug bite    Not sure what kind of bug it was , but felt like a sting on her finger. There was swelling, pain, warmness and itchiness has been icing area for relief X Wednesday    Patient complains of a bug bite on their left ring finger, DIP joint,  for the past two days. The patient reports worsening redness and swelling, but no fever, chills, or drainage. They have been using OTC ointment for inflammation, but no improvement. The patient has no known allergies to antibiotics. We discussed starting antibiotics, and I decided on Clindamycin 300mg , three times a day for three weeks. The patient was advised to monitor for signs of improving or worsening infection and to follow up on Monday for further evaluation.  Observations/Objective: There were no vitals filed for this visit.   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   Differential diagnosis:  Cellulitis  vs. Septic Arthritis   Plan:  Start  Clindamycin 300mg , three times a day for three weeks to cover potential joint infection. Advise the patient to monitor for fever, chills, increased redness, or other signs of systemic infection. Prescribe ibuprofen for pain management if needed. Follow up in person in a few days for re-evaluation to rule out or confirm septic arthritis/osteomyelitis with potential imaging or fluid analysis. Patient education on the signs of worsening infection and instructions to seek immediate care if symptoms escalate.      Relevant Medications   clindamycin (CLEOCIN) 300 MG capsule    There are no discontinued medications.   Follow Up Instructions: No follow-ups on file.    I discussed the assessment and treatment plan with the patient. The patient was provided an opportunity to ask questions and all were answered. The patient agreed with the plan and demonstrated an understanding of the instructions.   The patient was advised to call back or seek an in-person evaluation if the symptoms worsen or if the condition fails to improve as anticipated. Garnette Gunner, MD

## 2022-08-21 DIAGNOSIS — E785 Hyperlipidemia, unspecified: Secondary | ICD-10-CM | POA: Insufficient documentation

## 2022-08-21 NOTE — Assessment & Plan Note (Addendum)
Often overestimated on echo.  Will plan for assessment with CT angiogram of the chest-aorta in December.  This will be recommended 85-month follow-up for dilated thoracic aorta.  If stable at that time we can consider annual check.  Based on recommendation would be maintain adequate blood pressure as well as better control.

## 2022-08-21 NOTE — Progress Notes (Signed)
Virtual Visit via Video Note   Because of Monique Rangel's co-morbid illnesses, she is at least at moderate risk for complications without adequate follow up.  This format is felt to be most appropriate for this patient at this time.  All issues noted in this document were discussed and addressed.  A limited physical exam was performed with this format.  Please refer to the patient's chart for her consent to telehealth for [name].      Patient has given verbal permission to conduct this visit via virtual appointment and to bill insurance 08/26/2022 10:56 PM     Evaluation Performed:  Follow-up visit  Date:  08/26/2022   ID:  Monique Rangel, Monique Rangel, Monique Rangel, MRN [id number]  Patient Location: Home Provider Location: Office/Clinic  PCP:  Monique Sax, MD  Cardiologist:  Monique Lemma, MD  Electrophysiologist:  None   Chief Complaint:   No chief complaint on file.   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  Does confirm LAFB with a stable axis of -55 .   2D echo was normal with no RWMA.  Nothing to suggest an ischemic etiology.  If she were to have concerning symptoms for possible angina, it would not be unreasonable to assess for ischemia.      Hyperlipidemia with target LDL less than 100 (Chronic)    Last lipids were drawn in February 2023.  She should be due to have labs checked soon as her lipids were not in the normal range during last assessment.  Based on dilated as aortic, would like to see LDL less than 100 at least. Pending upcoming results, may want to consider initiating therapy.      Dilatation of thoracic aorta (HCC) (Chronic)    Often overestimated on echo.  Will plan for  assessment with CT angiogram of the chest-aorta in December.  This will be recommended 67-month follow-up for dilated thoracic aorta.  If stable at that time we can consider annual check.  Based on recommendation would be maintain adequate blood pressure as well as better control.      Relevant Orders   Basic metabolic panel   CT ANGIO CHEST AORTA W/CM & OR WO/CM    ====================================  History of Present Illness:    Monique Rangel is an obese 67 y.o. female non-smoker with PMH notable for Intermittent Asthma who presents via audio/video conferencing for a telehealth visit today as a 1 month follow-up to discuss results of echocardiogram performed to evaluate EKG with Incomplete RBBB and LAFB.  Monique Rangel was seen for initial consultation on Jul 18, 2022 for complaints of chest pain and shortness of breath at the request of Monique Rangel,*/ Monique Mast, MD. CP started shortly after doing yard work with heavy lifting (heavy bags of mulch). CP lasted ~ 1 min - described as "tightness" that "took her breath away" -resolved spontaneously.  Had also noted that she was having issues with her asthma, but at this time when she tried her inhaler, it did not help.  No recurrent chest pain just feeling tired since then.  No exertional dyspnea.  Occasional leg swelling.  Noted to have mild varicose veins. => EKG showed incomplete  RBBB but also LAFB (axis was -55 ). 2D echo ordered along with EKG -> stable EKG.  Hospitalizations:  N/A  Recent - Interim CV studies:   The following studies were reviewed today: TTE September 07, 2022: Normal LV size and function.  EF 60 to 65%.  No RWMA.  GR 1 DD.  Normal RV size and function.  Unable to assess PAP.  Grossly normal MV.  AoV sclerosis but no stenosis.  Mild extending aortic dilation-44 mm. Plan was follow-up CT angiogram chest/aorta in December 2024  Inerval History   Monique Rangel is being seen today to discuss results of her  echocardiogram to assess abnormal EKG.  Echo was essentially normal with exception of mild aches and aortic dilation.  No wall motion abnormality and no other structural abnormalities.  No further chest pain or pressure with rest or exertion.  No PND orthopnea edema.  No sensation of rapid regular heartbeats or palpitations.  No syncope or near syncope.  No TIA or emesis fugax.  She has noted having neck pain on the side intermittently and this has not yet been fully evaluated.  Cardiovascular ROS: no chest pain or dyspnea on exertion positive for - dyspnea on exertion, edema, irregular heartbeat, orthopnea, palpitations, paroxysmal nocturnal dyspnea, shortness of breath, and neck pain, mild exercise intolerance; somewhat more rigid when hearing different treatment options. negative for - edema, loss of consciousness, orthopnea, palpitations, paroxysmal nocturnal dyspnea, rapid heart rate, or shortness of breath   ROS:  Please see the history of present illness.     ROS -> Noted some fatigue since the episode.  Some joint pain.  Controlled depression but definitely have anxiety.  Past Medical History:  Diagnosis Date   Allergy    Anxiety 05/11/2016   Past Surgical History:  Procedure Laterality Date   EYE SURGERY  both cataracts     Current Meds  Medication Sig   albuterol (VENTOLIN HFA) 108 (90 Base) MCG/ACT inhaler Inhale 1-2 puffs into the lungs every 6 (six) hours as needed for wheezing or shortness of breath.   Biotin (BIOTIN MAXIMUM STRENGTH) 10 MG TABS Take by mouth.   clindamycin (CLEOCIN) 300 MG capsule Take 1 capsule (300 mg total) by mouth 3 (three) times daily for 21 days.   diclofenac Sodium (VOLTAREN) 1 % GEL Apply a small grape sized amount to right knee every 6 hours as needed.   diphenhydrAMINE (BENADRYL) 25 MG tablet Take 25 mg by mouth at bedtime as needed.   DULoxetine (CYMBALTA) 20 MG capsule TAKE 2 CAPSULES BY MOUTH DAILY   fluticasone (FLONASE) 50 MCG/ACT  nasal spray Place 2 sprays into both nostrils every morning.   glucosamine-chondroitin 500-400 MG tablet Take 1 tablet by mouth 3 (three) times daily.   Multiple Vitamin (MULTI-VITAMIN) tablet Take by mouth.   Multiple Vitamins-Minerals (PRESERVISION AREDS PO) Take by mouth.   omeprazole (PRILOSEC) 40 MG capsule TAKE 1 CAPSULE BY MOUTH DAILY     Allergies:   Lamisil [terbinafine] and Quinine   Social History   Tobacco Use   Smoking status: Never   Smokeless tobacco: Never  Vaping Use   Vaping Use: Never used  Substance Use Topics   Alcohol use: Yes    Alcohol/week: 5.0 standard drinks of alcohol    Types: 5 Glasses of wine per week    Comment: usually with a meal   Drug use: Never     Family Hx: The patient's family history includes Arthritis in her maternal aunt; Cancer in  her father; Diabetes in her father; Hearing loss in her mother; Vision loss in her maternal aunt. There is no history of Breast cancer.   Labs/Other Tests and Data Reviewed:    EKG:  An ECG dated 08/15/2022 was personally reviewed today and demonstrated:  NSR - 76 bpm. LAFB with RSR' (with different lead placement could consider inc RBBB). - Axis remains -55  ; Stable   Recent Labs: 06/20/2022: Pro B Natriuretic peptide (BNP) 16.0   Recent Lipid Panel Lab Results  Component Value Date/Time   CHOL 237 (H) 04/28/2021 08:50 AM   TRIG 121.0 04/28/2021 08:50 AM   HDL 78.10 04/28/2021 08:50 AM   CHOLHDL 3 04/28/2021 08:50 AM   LDLCALC 134 (H) 04/28/2021 08:50 AM    Wt Readings from Last 3 Encounters:  08/23/22 200 lb (90.7 kg)  08/15/22 200 lb 0.8 oz (90.7 kg)  07/18/22 198 lb 9.6 oz (90.1 kg)     Objective:    Vital Signs:  Ht 5\' 6"  (1.676 m)   Wt 200 lb (90.7 kg)   BMI 32.28 kg/m   VITAL SIGNS:  reviewed GEN:  no acute distress RESPIRATORY:  normal respiratory effort, symmetric expansion NEURO:  alert and oriented x 3, no obvious focal  deficit   ==========================================  COVID-Rangel Education: The signs and symptoms of COVID-Rangel were discussed with the patient and how to seek care for testing (follow up with PCP or arrange E-visit).   The importance of social distancing was discussed today.  Time:   Today, I have spent 12 minutes with the patient with telehealth technology discussing the above problems.   An additional spent charting (reviewing prior notes, hospital records, studies, labs etc.) Total   Medication Adjustments/Labs and Tests Ordered: Current medicines are reviewed at length with the patient today.  Concerns regarding medicines are outlined above.   Patient Instructions  Medication Instructions:  none *If you need a refill on your cardiac medications before your next appointment, please call your pharmacy*   Lab Work: BMP prior to CT  If you have labs (blood work) drawn today and your tests are completely normal, you will receive your results only by: MyChart Message (if you have MyChart) OR A paper copy in the mail If you have any lab test that is abnormal or we need to change your treatment, we will call you to review the results.   Testing/Procedures: CT Angiogram -Chest -Aorta - in December  Will be scheduled at [facility] at [facility] December 2024. Non-Cardiac CT Angiography (CTA), is a special type of CT scan that uses a computer to produce multi-dimensional views of major blood vessels in aorta. In CT angiography, a contrast material is injected through an IV to help visualize the blood vessels   Follow-Up: At [facility], you and your health needs are our priority.  As part of our continuing mission to provide you with exceptional heart care, we have created designated Provider Care Teams.  These Care Teams include your primary Cardiologist (physician) and Advanced Practice Providers (APPs -  Physician Assistants and Nurse Practitioners) who  all work together to provide you with the care you need, when you need it.  Your next appointment:   In January 2025 after CT  Provider:   Bryan Lemma, MD     Other Instructions Keep up the diet & exercise effots.    Signed, Monique Lemma, MD  08/26/2022 10:56 PM    [facility]

## 2022-08-21 NOTE — Assessment & Plan Note (Signed)
EKGs here still show more RSR prime did not complete RBBB.  Does confirm LAFB with a stable axis of -55 .   2D echo was normal with no RWMA.  Nothing to suggest an ischemic etiology.  If she were to have concerning symptoms for possible angina, it would not be unreasonable to assess for ischemia.

## 2022-08-21 NOTE — Assessment & Plan Note (Signed)
Last lipids were drawn in February 2023.  She should be due to have labs checked soon as her lipids were not in the normal range during last assessment.  Based on dilated as aortic, would like to see LDL less than 100 at least. Pending upcoming results, may want to consider initiating therapy.

## 2022-08-23 ENCOUNTER — Ambulatory Visit: Payer: PPO | Attending: Cardiology | Admitting: Cardiology

## 2022-08-23 ENCOUNTER — Telehealth: Payer: Self-pay | Admitting: *Deleted

## 2022-08-23 VITALS — Ht 66.0 in | Wt 200.0 lb

## 2022-08-23 DIAGNOSIS — I77810 Thoracic aortic ectasia: Secondary | ICD-10-CM

## 2022-08-23 DIAGNOSIS — M009 Pyogenic arthritis, unspecified: Secondary | ICD-10-CM | POA: Insufficient documentation

## 2022-08-23 DIAGNOSIS — I452 Bifascicular block: Secondary | ICD-10-CM | POA: Diagnosis not present

## 2022-08-23 DIAGNOSIS — E785 Hyperlipidemia, unspecified: Secondary | ICD-10-CM | POA: Diagnosis not present

## 2022-08-23 NOTE — Patient Instructions (Addendum)
Medication Instructions:  none *If you need a refill on your cardiac medications before your next appointment, please call your pharmacy*   Lab Work: BMP prior to CT  If you have labs (blood work) drawn today and your tests are completely normal, you will receive your results only by: MyChart Message (if you have MyChart) OR A paper copy in the mail If you have any lab test that is abnormal or we need to change your treatment, we will call you to review the results.   Testing/Procedures: CT Angiogram -Chest -Aorta - in December  Will be scheduled at [facility] at [facility] December 2024. Non-Cardiac CT Angiography (CTA), is a special type of CT scan that uses a computer to produce multi-dimensional views of major blood vessels in aorta. In CT angiography, a contrast material is injected through an IV to help visualize the blood vessels   Follow-Up: At [facility], you and your health needs are our priority.  As part of our continuing mission to provide you with exceptional heart care, we have created designated Provider Care Teams.  These Care Teams include your primary Cardiologist (physician) and Advanced Practice Providers (APPs -  Physician Assistants and Nurse Practitioners) who all work together to provide you with the care you need, when you need it.  Your next appointment:   In January 2025 after CT  Provider:   Bryan Lemma, MD     Other Instructions Keep up the diet & exercise effots.

## 2022-08-23 NOTE — Assessment & Plan Note (Signed)
The patient presents with a suspected infected bug bite on the ring finger's first joint with redness, swelling, and possible tracking overlying joint, reduced ability for movement.   Differential diagnosis:  Cellulitis  vs. Septic Arthritis   Plan:  Start Clindamycin 300mg , three times a day for three weeks to cover potential joint infection. Advise the patient to monitor for fever, chills, increased redness, or other signs of systemic infection. Prescribe ibuprofen for pain management if needed. Follow up in person in a few days for re-evaluation to rule out or confirm septic arthritis/osteomyelitis with potential imaging or fluid analysis. Patient education on the signs of worsening infection and instructions to seek immediate care if symptoms escalate.

## 2022-08-23 NOTE — Telephone Encounter (Signed)
Called patient to go over AVS summary for today's visit. Orders has been placed and lab slip will be mailed. Patient verbalized understanding.

## 2022-09-10 ENCOUNTER — Other Ambulatory Visit: Payer: Self-pay | Admitting: Family Medicine

## 2022-09-10 DIAGNOSIS — F411 Generalized anxiety disorder: Secondary | ICD-10-CM

## 2022-09-22 ENCOUNTER — Ambulatory Visit: Payer: PPO | Admitting: Family Medicine

## 2022-09-22 ENCOUNTER — Encounter: Payer: Self-pay | Admitting: Family Medicine

## 2022-09-22 VITALS — BP 120/72 | HR 68 | Temp 98.2°F | Wt 193.8 lb

## 2022-09-22 DIAGNOSIS — R399 Unspecified symptoms and signs involving the genitourinary system: Secondary | ICD-10-CM | POA: Insufficient documentation

## 2022-09-22 DIAGNOSIS — K5901 Slow transit constipation: Secondary | ICD-10-CM | POA: Diagnosis not present

## 2022-09-22 LAB — POC URINALSYSI DIPSTICK (AUTOMATED)
Bilirubin, UA: NEGATIVE
Blood, UA: NEGATIVE
Glucose, UA: NEGATIVE
Ketones, UA: NEGATIVE
Protein, UA: NEGATIVE
Spec Grav, UA: 1.025 (ref 1.010–1.025)
Urobilinogen, UA: 0.2 E.U./dL
pH, UA: 6 (ref 5.0–8.0)

## 2022-09-22 MED ORDER — CEPHALEXIN 500 MG PO CAPS
500.0000 mg | ORAL_CAPSULE | Freq: Two times a day (BID) | ORAL | 0 refills | Status: DC
Start: 2022-09-22 — End: 2022-11-06

## 2022-09-22 MED ORDER — PHENAZOPYRIDINE HCL 200 MG PO TABS: 200.0000 mg | ORAL_TABLET | Freq: Three times a day (TID) | ORAL | 0 refills | Status: DC | PRN

## 2022-09-22 NOTE — Patient Instructions (Signed)
Urinary Tract Infection, Adult A urinary tract infection (UTI) is an infection of any part of the urinary tract. The urinary tract includes: The kidneys. The ureters. The bladder. The urethra. These organs make, store, and get rid of pee (urine) in the body. What are the causes? This infection is caused by germs (bacteria) in your genital area. These germs grow and cause swelling (inflammation) of your urinary tract. What increases the risk? The following factors may make you more likely to develop this condition: Using a small, thin tube (catheter) to drain pee. Not being able to control when you pee or poop (incontinence). Being female. If you are female, these things can increase the risk: Using these methods to prevent pregnancy: A medicine that kills sperm (spermicide). A device that blocks sperm (diaphragm). Having low levels of a female hormone (estrogen). Being pregnant. You are more likely to develop this condition if: You have genes that add to your risk. You are sexually active. You take antibiotic medicines. You have trouble peeing because of: A prostate that is bigger than normal, if you are female. A blockage in the part of your body that drains pee from the bladder. A kidney stone. A nerve condition that affects your bladder. Not getting enough to drink. Not peeing often enough. You have other conditions, such as: Diabetes. A weak disease-fighting system (immune system). Sickle cell disease. Gout. Injury of the spine. What are the signs or symptoms? Symptoms of this condition include: Needing to pee right away. Peeing small amounts often. Pain or burning when peeing. Blood in the pee. Pee that smells bad or not like normal. Trouble peeing. Pee that is cloudy. Fluid coming from the vagina, if you are female. Pain in the belly or lower back. Other symptoms include: Vomiting. Not feeling hungry. Feeling mixed up (confused). This may be the first symptom in  older adults. Being tired and grouchy (irritable). A fever. Watery poop (diarrhea). How is this treated? Taking antibiotic medicine. Taking other medicines. Drinking enough water. In some cases, you may need to see a specialist. Follow these instructions at home:  Medicines Take over-the-counter and prescription medicines only as told by your doctor. If you were prescribed an antibiotic medicine, take it as told by your doctor. Do not stop taking it even if you start to feel better. General instructions Make sure you: Pee until your bladder is empty. Do not hold pee for a long time. Empty your bladder after sex. Wipe from front to back after peeing or pooping if you are a female. Use each tissue one time when you wipe. Drink enough fluid to keep your pee pale yellow. Keep all follow-up visits. Contact a doctor if: You do not get better after 1-2 days. Your symptoms go away and then come back. Get help right away if: You have very bad back pain. You have very bad pain in your lower belly. You have a fever. You have chills. You feeling like you will vomit or you vomit. Summary A urinary tract infection (UTI) is an infection of any part of the urinary tract. This condition is caused by germs in your genital area. There are many risk factors for a UTI. Treatment includes antibiotic medicines. Drink enough fluid to keep your pee pale yellow. This information is not intended to replace advice given to you by your health care provider. Make sure you discuss any questions you have with your health care provider. Document Revised: 10/06/2019 Document Reviewed: 10/11/2019 Elsevier Patient Education    2024 [employer]  

## 2022-09-22 NOTE — Progress Notes (Signed)
Assessment/Plan:   Problem List Items Addressed This Visit       Digestive   Slow transit constipation    Constipation: Reports difficult bowel movements, history of constipation. No abdominal pain. -Continue to monitor and follow-up with PCP if ongoing.        Other   UTI symptoms - Primary    Urinary Tract Infection (UTI): Painful urination and urinary urgency for 2 days. Urinalysis shows 3+ white blood cells, no nitrites. No fever, chills, or flank pain. -Start Cephalexin 500mg  for 7 days. -Send urine for culture and sensitivity. -Consider OTC Pyridium for symptomatic relief. -Advise to avoid high sugar intake. -If symptoms worsen (increased pain, hematuria, nausea, vomiting, fever, back pain), seek urgent medical attention.    Follow-up: Monitor response to antibiotics and adjust treatment based on culture results.            Relevant Medications   cephALEXin (KEFLEX) 500 MG capsule   phenazopyridine (PYRIDIUM) 200 MG tablet   Other Relevant Orders   POCT Urinalysis Dipstick (Automated) (Completed)   Urine Culture    There are no discontinued medications.  Return if symptoms worsen or fail to improve.    Subjective:   Encounter date: 09/22/2022  Monique Rangel is a 67 y.o. 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; Mild intermittent asthma without complication; Sensorineural hearing loss (SNHL) of both ears; GAD (generalized anxiety disorder); Asymptomatic postmenopausal estrogen deficiency; DDD (degenerative disc disease), cervical; Migraine; Macular degeneration, bilateral; Chronic pain of both knees; Onychomycosis; History of torn meniscus of left knee; Primary osteoarthritis of left knee; Viral upper respiratory tract infection; Lipoma of buttock; Seborrheic keratosis; Gastroesophageal reflux disease; Epigastric pain; Gastroenteritis; Slow transit constipation; Fever blister; SOB (shortness of breath); 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   She  has a past medical history of Allergy and Anxiety (05/11/2016)..   She presents with chief complaint of Uti symptoms (Frequent urination x 2 days ) .   Discussed the use of AI scribe software for clinical note transcription with the patient, who gave verbal consent to proceed.  History of Present Illness   The patient, with a history of recurrent urinary tract infections (UTIs), presents with a two-day history of urinary symptoms. They describe the discomfort as an ache rather than the typical burning sensation associated with their previous UTIs. The patient reports increased urinary frequency with minimal output and denies hematuria. They also note two recent episodes of constipation, which they describe as unusually difficult, but it is unclear if this is related to the current urinary symptoms.  The patient denies systemic symptoms such as fever or chills. They mention a recent increase in physical activity, specifically bike riding, and a dietary change of consuming fresh cherries, but it is uncertain if these factors contributed to the current symptoms. The patient has been self-managing with chamomile tea and Manuka honey, but reports a gradual worsening of symptoms.  The patient also expresses gratitude for a previous successful treatment of an unidentified sting on their finger. They report complete resolution of the sting symptoms with the prescribed medication.       Review of Systems  Constitutional:  Negative for chills and fever.  Gastrointestinal:  Positive for constipation. Negative for abdominal pain,  nausea and vomiting.  Genitourinary:  Positive for dysuria, frequency and urgency. Negative for flank pain and  hematuria.    Past Surgical History:  Procedure Laterality Date   EYE SURGERY  both cataracts    Outpatient Medications Prior to Visit  Medication Sig Dispense Refill   albuterol (VENTOLIN HFA) 108 (90 Base) MCG/ACT inhaler Inhale 1-2 puffs into the lungs every 6 (six) hours as needed for wheezing or shortness of breath. 8 g 0   Biotin (BIOTIN MAXIMUM STRENGTH) 10 MG TABS Take by mouth.     diclofenac Sodium (VOLTAREN) 1 % GEL Apply a small grape sized amount to right knee every 6 hours as needed. 150 g 1   diphenhydrAMINE (BENADRYL) 25 MG tablet Take 25 mg by mouth at bedtime as needed.     DULoxetine (CYMBALTA) 20 MG capsule TAKE 2 CAPSULES BY MOUTH DAILY 60 capsule 1   fluticasone (FLONASE) 50 MCG/ACT nasal spray Place 2 sprays into both nostrils every morning.     glucosamine-chondroitin 500-400 MG tablet Take 1 tablet by mouth 3 (three) times daily.     Multiple Vitamin (MULTI-VITAMIN) tablet Take by mouth.     Multiple Vitamins-Minerals (PRESERVISION AREDS PO) Take by mouth.     omeprazole (PRILOSEC) 40 MG capsule TAKE 1 CAPSULE BY MOUTH DAILY 90 capsule 1   No facility-administered medications prior to visit.    Family History  Problem Relation Age of Onset   Hearing loss Mother    Cancer Father    Diabetes Father    Arthritis Maternal Aunt    Vision loss Maternal Aunt    Breast cancer Neg Hx     Social History   Socioeconomic History   Marital status: Married    Spouse name: Not on file   Number of children: 2   Years of education: Not on file   Highest education level: Not on file  Occupational History   Not on file  Tobacco Use   Smoking status: Never   Smokeless tobacco: Never  Vaping Use   Vaping status: Never Used  Substance and Sexual Activity   Alcohol use: Yes    Alcohol/week: 5.0 standard drinks of alcohol    Types: 5 Glasses of wine per week    Comment: usually with a meal   Drug use: Never   Sexual activity: Not Currently    Birth  control/protection: Post-menopausal  Other Topics Concern   Not on file  Social History Narrative   Not on file   Social Determinants of Health   Financial Resource Strain: Low Risk  (03/28/2022)   Overall Financial Resource Strain (CARDIA)    Difficulty of Paying Living Expenses: Not hard at all  Food Insecurity: No Food Insecurity (03/28/2022)   Hunger Vital Sign    Worried About Running Out of Food in the Last Year: Never true    Ran Out of Food in the Last Year: Never true  Transportation Needs: No Transportation Needs (03/28/2022)   PRAPARE - Administrator, Civil Service (Medical): No    Lack of Transportation (Non-Medical): No  Physical Activity: Inactive (03/28/2022)   Exercise Vital Sign    Days of Exercise per Week: 0 days    Minutes of Exercise per Session: 0 min  Stress: No Stress Concern Present (03/28/2022)   [employer] of Occupational Health - Occupational Stress Questionnaire    Feeling of Stress : Not at all  Social Connections: Not on file  Intimate Partner Violence: Not  on file                                                                                                  Objective:  Physical Exam: BP 120/72 (BP Location: Left Arm, Patient Position: Sitting, Cuff Size: Large)   Pulse 68   Temp 98.2 F (36.8 C) (Temporal)   Wt 193 lb 12.8 oz (87.9 kg)   SpO2 100%   BMI 31.28 kg/m     Physical Exam Constitutional:      General: She is not in acute distress.    Appearance: Normal appearance. She is not ill-appearing or toxic-appearing.  HENT:     Head: Normocephalic and atraumatic.     Nose: Nose normal. No congestion.  Eyes:     General: No scleral icterus.    Extraocular Movements: Extraocular movements intact.  Cardiovascular:     Rate and Rhythm: Normal rate and regular rhythm.     Pulses: Normal pulses.     Heart sounds: Normal heart sounds.  Pulmonary:     Effort: Pulmonary effort is normal. No respiratory distress.      Breath sounds: Normal breath sounds.  Abdominal:     General: Abdomen is flat. Bowel sounds are normal.     Palpations: Abdomen is soft.  Musculoskeletal:        General: Normal range of motion.  Lymphadenopathy:     Cervical: No cervical adenopathy.  Skin:    General: Skin is warm and dry.     Findings: No rash.  Neurological:     General: No focal deficit present.     Mental Status: She is alert and oriented to person, place, and time. Mental status is at baseline.  Psychiatric:        Mood and Affect: Mood normal.        Behavior: Behavior normal.        Thought Content: Thought content normal.        Judgment: Judgment normal.     ECHOCARDIOGRAM COMPLETE  Result Date: 08/15/2022    ECHOCARDIOGRAM REPORT   Patient Name:   XOE HOE Date of Exam: 08/15/2022 Medical Rec #:  [id number]     Height:       66.0 in Accession #:    [id number]    Weight:       198.6 lb Date of Birth:  1955-08-05    BSA:          1.994 m Patient Age:    66 years      BP:           130/70 mmHg Patient Gender: F             HR:           76 bpm. Exam Location:  [address] Procedure: 2D Echo, Cardiac Doppler and Color Doppler Indications:    R07.9* Chest pain, unspecified; R94.31 Abnormal EKG  History:        Patient has no prior history of Echocardiogram examinations.  Signs/Symptoms:Shortness of Breath and Fatigue. Incomplete RBBB.  Sonographer:    Cathie Beams RCS Referring Phys: [phone] DAVID W HARDING IMPRESSIONS  1. Left ventricular ejection fraction, by estimation, is 60 to 65%. The left ventricle has normal function. The left ventricle has no regional wall motion abnormalities. There is mild left ventricular hypertrophy of the basal-septal segment. Left ventricular diastolic parameters are consistent with Grade I diastolic dysfunction (impaired relaxation).  2. Right ventricular systolic function is normal. The right ventricular size is normal. Tricuspid regurgitation signal is inadequate for  assessing PA pressure.  3. The mitral valve is grossly normal. Trivial mitral valve regurgitation.  4. The aortic valve is tricuspid. There is mild calcification of the aortic valve. There is mild thickening of the aortic valve. Aortic valve regurgitation is not visualized. Aortic valve sclerosis/calcification is present, without any evidence of aortic stenosis.  5. Aortic dilatation noted. There is mild dilatation of the ascending aorta, measuring 44 mm. Comparison(s): No prior Echocardiogram. FINDINGS  Left Ventricle: Left ventricular ejection fraction, by estimation, is 60 to 65%. The left ventricle has normal function. The left ventricle has no regional wall motion abnormalities. The left ventricular internal cavity size was normal in size. There is  mild left ventricular hypertrophy of the basal-septal segment. Left ventricular diastolic parameters are consistent with Grade I diastolic dysfunction (impaired relaxation). Right Ventricle: The right ventricular size is normal. No increase in right ventricular wall thickness. Right ventricular systolic function is normal. Tricuspid regurgitation signal is inadequate for assessing PA pressure. Left Atrium: Left atrial size was normal in size. Right Atrium: Right atrial size was normal in size. Pericardium: There is no evidence of pericardial effusion. Mitral Valve: The mitral valve is grossly normal. Trivial mitral valve regurgitation. Tricuspid Valve: The tricuspid valve is normal in structure. Tricuspid valve regurgitation is trivial. Aortic Valve: The aortic valve is tricuspid. There is mild calcification of the aortic valve. There is mild thickening of the aortic valve. Aortic valve regurgitation is not visualized. Aortic valve sclerosis/calcification is present, without any evidence of aortic stenosis. Pulmonic Valve: The pulmonic valve was normal in structure. Pulmonic valve regurgitation is trivial. Aorta: Aortic dilatation noted. There is mild dilatation of  the ascending aorta, measuring 44 mm. IAS/Shunts: The atrial septum is grossly normal.  LEFT VENTRICLE PLAX 2D LVIDd:         3.50 cm   Diastology LVIDs:         1.70 cm   LV e' medial:    7.18 cm/s LV PW:         1.00 cm   LV E/e' medial:  8.5 LV IVS:        1.30 cm   LV e' lateral:   11.20 cm/s LVOT diam:     2.00 cm   LV E/e' lateral: 5.4 LV SV:         78 LV SV Index:   39 LVOT Area:     3.14 cm  RIGHT VENTRICLE RV Basal diam:  3.00 cm RV Mid diam:    2.30 cm RV S prime:     11.60 cm/s TAPSE (M-mode): 2.5 cm LEFT ATRIUM             Index        RIGHT ATRIUM           Index LA diam:        3.00 cm 1.50 cm/m   RA Area:     12.70 cm LA Vol (A2C):  39.2 ml 19.66 ml/m  RA Volume:   35.60 ml  17.85 ml/m LA Vol (A4C):   24.5 ml 12.29 ml/m LA Biplane Vol: 31.1 ml 15.60 ml/m  AORTIC VALVE LVOT Vmax:   122.00 cm/s LVOT Vmean:  80.600 cm/s LVOT VTI:    0.248 m  AORTA Ao Root diam: 3.50 cm Ao Asc diam:  4.40 cm MITRAL VALVE MV Area (PHT): 2.96 cm    SHUNTS MV Decel Time: 256 msec    Systemic VTI:  0.25 m MV E velocity: 60.80 cm/s  Systemic Diam: 2.00 cm MV A velocity: 83.30 cm/s MV E/A ratio:  0.73 Laurance Flatten MD Electronically signed by Laurance Flatten MD Signature Date/Time: 08/15/2022/4:36:59 PM    Final    MM 3D SCREENING MAMMOGRAM BILATERAL BREAST  Result Date: 08/03/2022 CLINICAL DATA:  Screening. EXAM: DIGITAL SCREENING BILATERAL MAMMOGRAM WITH TOMOSYNTHESIS AND CAD TECHNIQUE: Bilateral screening digital craniocaudal and mediolateral oblique mammograms were obtained. Bilateral screening digital breast tomosynthesis was performed. The images were evaluated with computer-aided detection. COMPARISON:  Previous exam(s). ACR Breast Density Category b: There are scattered areas of fibroglandular density. FINDINGS: There are no findings suspicious for malignancy. IMPRESSION: No mammographic evidence of malignancy. A result letter of this screening mammogram will be mailed directly to the patient.  RECOMMENDATION: Screening mammogram in one year. (Code:SM-B-01Y) BI-RADS CATEGORY  1: Negative. Electronically Signed   By: Hulan Saas M.D.   On: 08/03/2022 12:52    Recent Results (from the past 2160 hour(s))  ECHOCARDIOGRAM COMPLETE     Status: None   Collection Time: 08/15/22  4:18 PM  Result Value Ref Range   Area-P 1/2 2.96 cm2   S' Lateral 1.70 cm   Est EF 60 - 65%   POCT Urinalysis Dipstick (Automated)     Status: Abnormal   Collection Time: 09/22/22  4:03 PM  Result Value Ref Range   Color, UA yellow    Clarity, UA clear    Glucose, UA Negative Negative   Bilirubin, UA neg    Ketones, UA neg    Spec Grav, UA 1.025 1.010 - 1.025   Blood, UA neg    pH, UA 6.0 5.0 - 8.0   Protein, UA Negative Negative   Urobilinogen, UA 0.2 0.2 or 1.0 E.U./dL   Nitrite, UA trace    Leukocytes, UA Trace (A) Negative    Comment: 3+        Garner Nash, MD, MS

## 2022-09-22 NOTE — Assessment & Plan Note (Signed)
Urinary Tract Infection (UTI): Painful urination and urinary urgency for 2 days. Urinalysis shows 3+ white blood cells, no nitrites. No fever, chills, or flank pain. -Start Cephalexin 500mg  for 7 days. -Send urine for culture and sensitivity. -Consider OTC Pyridium for symptomatic relief. -Advise to avoid high sugar intake. -If symptoms worsen (increased pain, hematuria, nausea, vomiting, fever, back pain), seek urgent medical attention.    Follow-up: Monitor response to antibiotics and adjust treatment based on culture results.

## 2022-09-22 NOTE — Assessment & Plan Note (Addendum)
Constipation: Reports difficult bowel movements, history of constipation. No abdominal pain. -Continue to monitor and follow-up with PCP if ongoing.

## 2022-09-24 LAB — URINE CULTURE
MICRO NUMBER:: 15188211
SPECIMEN QUALITY:: ADEQUATE

## 2022-10-07 ENCOUNTER — Telehealth: Payer: Self-pay

## 2022-10-07 ENCOUNTER — Ambulatory Visit: Payer: PPO | Admitting: Family Medicine

## 2022-10-07 ENCOUNTER — Encounter: Payer: Self-pay | Admitting: Family Medicine

## 2022-10-07 VITALS — BP 122/76 | HR 75 | Temp 97.3°F | Ht 66.0 in | Wt 191.2 lb

## 2022-10-07 DIAGNOSIS — Z131 Encounter for screening for diabetes mellitus: Secondary | ICD-10-CM | POA: Insufficient documentation

## 2022-10-07 DIAGNOSIS — E7800 Pure hypercholesterolemia, unspecified: Secondary | ICD-10-CM | POA: Diagnosis not present

## 2022-10-07 DIAGNOSIS — M5030 Other cervical disc degeneration, unspecified cervical region: Secondary | ICD-10-CM | POA: Diagnosis not present

## 2022-10-07 DIAGNOSIS — B001 Herpesviral vesicular dermatitis: Secondary | ICD-10-CM | POA: Diagnosis not present

## 2022-10-07 DIAGNOSIS — Z0000 Encounter for general adult medical examination without abnormal findings: Secondary | ICD-10-CM

## 2022-10-07 DIAGNOSIS — K009 Disorder of tooth development, unspecified: Secondary | ICD-10-CM

## 2022-10-07 LAB — URINALYSIS, ROUTINE W REFLEX MICROSCOPIC
Bilirubin Urine: NEGATIVE
Hgb urine dipstick: NEGATIVE
Ketones, ur: NEGATIVE
Leukocytes,Ua: NEGATIVE
Nitrite: NEGATIVE
Specific Gravity, Urine: 1.02 (ref 1.000–1.030)
Total Protein, Urine: NEGATIVE
Urine Glucose: NEGATIVE
Urobilinogen, UA: 0.2 (ref 0.0–1.0)
pH: 6.5 (ref 5.0–8.0)

## 2022-10-07 LAB — CBC
HCT: 41.8 % (ref 36.0–46.0)
Hemoglobin: 13.6 g/dL (ref 12.0–15.0)
MCHC: 32.5 g/dL (ref 30.0–36.0)
MCV: 88.2 fl (ref 78.0–100.0)
Platelets: 254 10*3/uL (ref 150.0–400.0)
RBC: 4.74 Mil/uL (ref 3.87–5.11)
RDW: 13.7 % (ref 11.5–15.5)
WBC: 5.8 10*3/uL (ref 4.0–10.5)

## 2022-10-07 LAB — COMPREHENSIVE METABOLIC PANEL
ALT: 12 U/L (ref 0–35)
AST: 18 U/L (ref 0–37)
Albumin: 4.4 g/dL (ref 3.5–5.2)
Alkaline Phosphatase: 66 U/L (ref 39–117)
BUN: 17 mg/dL (ref 6–23)
CO2: 29 mEq/L (ref 19–32)
Calcium: 9.2 mg/dL (ref 8.4–10.5)
Chloride: 102 mEq/L (ref 96–112)
Creatinine, Ser: 0.77 mg/dL (ref 0.40–1.20)
GFR: 79.73 mL/min (ref >60.00)
Glucose, Bld: 116 mg/dL — ABNORMAL HIGH (ref 70–99)
Potassium: 4.2 mEq/L (ref 3.5–5.1)
Sodium: 141 mEq/L (ref 135–145)
Total Bilirubin: 1.1 mg/dL (ref 0.2–1.2)
Total Protein: 6.7 g/dL (ref 6.0–8.3)

## 2022-10-07 LAB — HEMOGLOBIN A1C: Hgb A1c MFr Bld: 5.7 % (ref 4.6–6.5)

## 2022-10-07 LAB — LIPID PANEL
Cholesterol: 251 mg/dL — ABNORMAL HIGH (ref 0–200)
HDL: 69.1 mg/dL (ref >39.00)
LDL Cholesterol: 154 mg/dL — ABNORMAL HIGH (ref 0–99)
NonHDL: 181.48
Total CHOL/HDL Ratio: 4
Triglycerides: 137 mg/dL (ref 0.0–149.0)
VLDL: 27.4 mg/dL (ref 0.0–40.0)

## 2022-10-07 MED ORDER — VALACYCLOVIR HCL 1 G PO TABS: 500.0000 mg | ORAL_TABLET | Freq: Two times a day (BID) | ORAL | 2 refills | Status: DC

## 2022-10-07 MED ORDER — ALPRAZOLAM 0.25 MG PO TABS
ORAL_TABLET | ORAL | 0 refills | Status: DC
Start: 2022-10-07 — End: 2023-04-20

## 2022-10-07 NOTE — Progress Notes (Signed)
Established Patient Office Visit   Subjective:  Patient ID: Monique Rangel, female    DOB: 11/13/1955  Age: 67 y.o. MRN: [id number]  Chief Complaint  Patient presents with   Annual Exam    CPE. Pt is fasting. Complains of toe nail fungus, a fever blister on lower lip and Sharp pains in the right side of neck.     HPI Encounter Diagnoses  Name Primary?   Healthcare maintenance Yes   Elevated LDL cholesterol level    Screening for diabetes mellitus    Herpes labialis    DDD (degenerative disc disease), cervical    Dental anomaly    Here fasting for health check and follow-up of above.  Has been doing well.  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  Patient's mother is doing well.  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  She is up-to-date on health maintenance including regular dental care.  She is subject to periodic outbreaks of herpes.  Upcoming dental work and is quite nervous of seeing the dentist.   Review of Systems  Constitutional: Negative.   HENT: Negative.    Eyes:  Negative for blurred vision, discharge and redness.  Respiratory: Negative.    Cardiovascular: Negative.   Gastrointestinal:  Negative for abdominal pain.  Genitourinary: Negative.   Musculoskeletal: Negative.  Negative for myalgias.  Skin:  Negative for rash.  Neurological:  Negative for tingling, loss of consciousness and weakness.  Endo/Heme/Allergies:  Negative for polydipsia.     Current Outpatient Medications:    albuterol (VENTOLIN HFA) 108 (90 Base) MCG/ACT inhaler, Inhale 1-2 puffs into the lungs every 6 (six) hours as needed for wheezing or shortness of breath., Disp: 8 g, Rfl: 0   ALPRAZolam (XANAX) 0.25 MG tablet, Take one 1/2-hour before procedure., Disp: 1 tablet, Rfl: 0   Biotin (BIOTIN MAXIMUM STRENGTH) 10 MG TABS, Take by mouth., Disp: , Rfl:    diclofenac Sodium (VOLTAREN)  1 % GEL, Apply a small grape sized amount to right knee every 6 hours as needed., Disp: 150 g, Rfl: 1   diphenhydrAMINE (BENADRYL) 25 MG tablet, Take 25 mg by mouth at bedtime as needed., Disp: , Rfl:    DULoxetine (CYMBALTA) 20 MG capsule, TAKE 2 CAPSULES BY MOUTH DAILY, Disp: 60 capsule, Rfl: 1   fluticasone (FLONASE) 50 MCG/ACT nasal spray, Place 2 sprays into both nostrils every morning., Disp: , Rfl:    glucosamine-chondroitin 500-400 MG tablet, Take 1 tablet by mouth 3 (three) times daily., Disp: , Rfl:    Multiple Vitamin (MULTI-VITAMIN) tablet, Take by mouth., Disp: , Rfl:    Multiple Vitamins-Minerals (PRESERVISION AREDS PO), Take by mouth., Disp: , Rfl:    omeprazole (PRILOSEC) 40 MG capsule, TAKE 1 CAPSULE BY MOUTH DAILY, Disp: 90 capsule, Rfl: 1   valACYclovir (VALTREX) 1000 MG tablet, Take 0.5 tablets (500 mg total) by mouth 2 (two) times daily for 3 days. As needed., Disp: 10 tablet, Rfl: 2   cephALEXin (KEFLEX) 500 MG capsule, Take 1 capsule (500 mg total) by mouth 2 (two) times daily. (Patient not taking: Reported on 10/07/2022), Disp: 14 capsule, Rfl: 0   phenazopyridine (PYRIDIUM) 200 MG tablet, Take 1 tablet (200 mg total) by mouth 3 (three) times daily as needed for pain. (Patient not taking: Reported on 10/07/2022), Disp: 10 tablet, Rfl: 0   Objective:     BP 122/76   Pulse  75   Temp (!) 97.3 F (36.3 C)   Ht 5\' 6"  (1.676 m)   Wt 191 lb 3.2 oz (86.7 kg)   SpO2 97%   BMI 30.86 kg/m    Physical Exam Constitutional:      General: She is not in acute distress.    Appearance: Normal appearance. She is not ill-appearing, toxic-appearing or diaphoretic.  HENT:     Head: Normocephalic and atraumatic.     Right Ear: Tympanic membrane, ear canal and external ear normal.     Left Ear: Tympanic membrane, ear canal and external ear normal.     Mouth/Throat:     Mouth: Mucous membranes are moist.     Pharynx: Oropharynx is clear. No oropharyngeal exudate or posterior  oropharyngeal erythema.  Eyes:     General: No scleral icterus.       Right eye: No discharge.        Left eye: No discharge.     Extraocular Movements: Extraocular movements intact.     Conjunctiva/sclera: Conjunctivae normal.     Pupils: Pupils are equal, round, and reactive to light.  Cardiovascular:     Rate and Rhythm: Normal rate and regular rhythm.  Pulmonary:     Effort: Pulmonary effort is normal. No respiratory distress.     Breath sounds: Normal breath sounds.  Abdominal:     General: Bowel sounds are normal.  Musculoskeletal:     Cervical back: No rigidity or tenderness.  Skin:    General: Skin is warm and dry.  Neurological:     Mental Status: She is alert and oriented to person, place, and time.  Psychiatric:        Mood and Affect: Mood normal.        Behavior: Behavior normal.      No results found for any visits on 10/07/22.    The 10-year ASCVD risk score (Arnett DK, et al., 2019) is: 6%    Assessment & Plan:   Healthcare maintenance -     CBC -     Comprehensive metabolic panel -     Urinalysis, Routine w reflex microscopic  Elevated LDL cholesterol level -     Comprehensive metabolic panel -     Lipid panel  Screening for diabetes mellitus -     Comprehensive metabolic panel -     Hemoglobin A1c  Herpes labialis -     valACYclovir HCl; Take 0.5 tablets (500 mg total) by mouth 2 (two) times daily for 3 days. As needed.  Dispense: 10 tablet; Refill: 2  DDD (degenerative disc disease), cervical -     Ambulatory referral to Sports Medicine  Dental anomaly -     ALPRAZolam; Take one 1/2-hour before procedure.  Dispense: 1 tablet; Refill: 0    Return in about 1 year (around 10/07/2023), or if symptoms worsen or fail to improve.  Up-to-date on health maintenance.  Will follow-up with GYN Pap.  Valtrex as needed herpes outbreaks.  Sports medicine follow-up for cervical degenerative disc disease.  Continue healthy active lifestyle.  Pends  upcoming dental procedure and agreed to send in a single dose prescription for Xanax.  She will let me know.  Information was given on health maintenance and disease prevention.      Mliss Sax, MD

## 2022-10-07 NOTE — Patient Instructions (Signed)
Visit Information  Thank you for taking time to visit with me today. Please don't hesitate to contact me if I can be of assistance to you.   Following are the goals we discussed today:   Goals Addressed             This Visit's Progress    COMPLETED: Care Coordination Activities-no follow up required       Care Coordination Interventions: Discussed [facility] services and support. Assessed SDOH. Advised to discuss with primary care physician if services needed in the future.         If you are experiencing a Mental Health or Behavioral Health Crisis or need someone to talk to, please call the Suicide and Crisis Lifeline: 988   Patient verbalizes understanding of instructions and care plan provided today and agrees to view in MyChart. Active MyChart status and patient understanding of how to access instructions and care plan via MyChart confirmed with patient.     The patient has been provided with contact information for the care management team and has been advised to call with any health related questions or concerns.   Bary Leriche, RN, MSN [facility] Care Management Care Management Coordinator Direct Line [phone]

## 2022-10-07 NOTE — Patient Outreach (Addendum)
  Care Coordination   In Person Provider Office Visit Note   10/07/2022 Name: Monique Rangel MRN: [id number] DOB: 1955-11-20  Monique Rangel is a 67 y.o. year old female who sees Mliss Sax, MD for primary care. I saw Monique Rangel in office today.  What matters to the patients health and wellness today?  N/A    Goals Addressed             This Visit's Progress    COMPLETED: Care Coordination Activities- no follow up required       Care Coordination Interventions: Discussed [facility] services and support. Assessed SDOH. Advised to discuss with primary care physician if services needed in the future.        SDOH assessments and interventions completed:  Yes     Care Coordination Interventions:  Yes, provided   Follow up plan: No further intervention required.   Encounter Outcome:  Pt. Visit Completed   Bary Leriche, RN, MSN [facility] Care Management Care Management Coordinator Direct Line [phone]

## 2022-10-31 ENCOUNTER — Ambulatory Visit (INDEPENDENT_AMBULATORY_CARE_PROVIDER_SITE_OTHER): Payer: PPO | Admitting: Internal Medicine

## 2022-10-31 ENCOUNTER — Encounter: Payer: Self-pay | Admitting: Internal Medicine

## 2022-10-31 ENCOUNTER — Other Ambulatory Visit (HOSPITAL_COMMUNITY)
Admission: RE | Admit: 2022-10-31 | Discharge: 2022-10-31 | Disposition: A | Discharge status: Home / Self Care | Payer: PPO | Source: Ambulatory Visit | Attending: Internal Medicine | Admitting: Internal Medicine

## 2022-10-31 VITALS — BP 130/80 | HR 72 | Temp 98.2°F | Ht 66.0 in | Wt 195.6 lb

## 2022-10-31 DIAGNOSIS — S70362A Insect bite (nonvenomous), left thigh, initial encounter: Secondary | ICD-10-CM

## 2022-10-31 DIAGNOSIS — S70361A Insect bite (nonvenomous), right thigh, initial encounter: Secondary | ICD-10-CM | POA: Diagnosis not present

## 2022-10-31 DIAGNOSIS — Z01419 Encounter for gynecological examination (general) (routine) without abnormal findings: Secondary | ICD-10-CM | POA: Diagnosis not present

## 2022-10-31 DIAGNOSIS — Z1151 Encounter for screening for human papillomavirus (HPV): Secondary | ICD-10-CM | POA: Diagnosis not present

## 2022-10-31 DIAGNOSIS — L2489 Irritant contact dermatitis due to other agents: Secondary | ICD-10-CM | POA: Diagnosis not present

## 2022-10-31 DIAGNOSIS — W57XXXA Bitten or stung by nonvenomous insect and other nonvenomous arthropods, initial encounter: Secondary | ICD-10-CM

## 2022-10-31 DIAGNOSIS — Z124 Encounter for screening for malignant neoplasm of cervix: Secondary | ICD-10-CM | POA: Insufficient documentation

## 2022-10-31 NOTE — Patient Instructions (Signed)
Apply Cortisone to affected area around ankles, then apply Vaseline or heavy moisturizer

## 2022-10-31 NOTE — Progress Notes (Signed)
[facility] [address] [place] [address] Dept: [phone] Dept Fax: [phone]  Acute Care Office Visit  Subjective:   Monique Rangel 09-26-1955 10/31/2022  Chief Complaint  Patient presents with   Gynecologic Exam    Referral to dermatology, bug bites on legs      HPI: Patient presents requesting routine GYN exam. No recent abnormal pap smears.   She also complains of bug bites to upper thighs onset approximately 1 week ago while out in her garden. She also reports wearing boots with socks this past week, and developed a rash around  where her socks were. Associated itching. Has been putting hydrocortisone cream on.    The following portions of the patient's history were reviewed and updated as appropriate: past medical history, past surgical history, family history, social history, allergies, medications, and problem list.   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/20/2022   Herpes labialis 09/06/2021   SOB (shortness of breath) 09/06/2021   Slow transit constipation 05/25/2021   Gastroesophageal reflux disease 05/21/2021   Epigastric pain 05/21/2021   Gastroenteritis 05/21/2021   Lipoma of buttock 04/27/2021   Seborrheic keratosis 04/27/2021   Viral upper respiratory tract infection 02/10/2021   History of torn meniscus of left knee 11/26/2020   Primary osteoarthritis of left knee 11/26/2020    Chronic pain of both knees 05/12/2020   Onychomycosis 05/12/2020   GAD (generalized anxiety disorder) 04/14/2020   Asymptomatic postmenopausal estrogen deficiency 04/14/2020   DDD (degenerative disc disease), cervical 04/14/2020   Migraine 04/14/2020   Macular degeneration, bilateral 04/14/2020   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   Sensorineural hearing loss (SNHL) of both ears 02/15/2017   Mild intermittent asthma without complication 03/14/2000   Past Medical History:  Diagnosis Date   Allergy    Anxiety 05/11/2016   Past Surgical History:  Procedure Laterality Date   EYE SURGERY  both cataracts   Family History  Problem Relation Age of Onset   Hearing loss Mother    Cancer Father    Diabetes Father    Arthritis Maternal Aunt    Vision loss Maternal Aunt    Breast cancer Neg Hx    Outpatient Medications Prior to Visit  Medication Sig Dispense Refill   albuterol (VENTOLIN HFA) 108 (90 Base) MCG/ACT inhaler Inhale 1-2 puffs into the lungs every 6 (six) hours as needed for wheezing or shortness of breath. 8 g 0   ALPRAZolam (XANAX) 0.25 MG tablet Take one 1/2-hour before procedure. 1 tablet 0   Biotin (BIOTIN MAXIMUM STRENGTH) 10 MG TABS Take by mouth.     diclofenac Sodium (VOLTAREN) 1 % GEL Apply a small grape sized amount to right knee every 6 hours as needed. 150 g 1   diphenhydrAMINE (BENADRYL) 25 MG tablet Take 25 mg by mouth at bedtime as needed.     DULoxetine (CYMBALTA) 20 MG capsule TAKE 2 CAPSULES BY MOUTH DAILY (Patient taking differently: Take  40 mg by mouth daily. Changed to 40 mg) 60 capsule 1   fluticasone (FLONASE) 50 MCG/ACT nasal spray Place 2 sprays into both nostrils every morning.     glucosamine-chondroitin 500-400 MG tablet Take 1 tablet by mouth 3 (three) times daily.     Multiple Vitamin (MULTI-VITAMIN) tablet Take by mouth.     omeprazole (PRILOSEC) 40 MG capsule TAKE 1 CAPSULE BY MOUTH DAILY 90 capsule 1   cephALEXin  (KEFLEX) 500 MG capsule Take 1 capsule (500 mg total) by mouth 2 (two) times daily. (Patient not taking: Reported on 10/07/2022) 14 capsule 0   Multiple Vitamins-Minerals (PRESERVISION AREDS PO) Take by mouth.     phenazopyridine (PYRIDIUM) 200 MG tablet Take 1 tablet (200 mg total) by mouth 3 (three) times daily as needed for pain. (Patient not taking: Reported on 10/07/2022) 10 tablet 0   No facility-administered medications prior to visit.   Allergies  Allergen Reactions   Lamisil [Terbinafine] Rash   Quinine Other (See Comments)    Mother had a bad reaction so does not want to use it Mother had a bad reaction so does not want to use it      ROS: A complete ROS was performed with pertinent positives/negatives noted in the HPI. The remainder of the ROS are negative.    Objective:   Today's Vitals   10/31/22 0900  BP: 130/80  Pulse: 72  Temp: 98.2 F (36.8 C)  TempSrc: Temporal  SpO2: 98%  Weight: 195 lb 9.6 oz (88.7 kg)  Height: 5\' 6"  (1.676 m)    GENERAL: Well-appearing, in NAD. Well nourished.  SKIN: Pink, warm and dry. Single raised erythematous papules to anterior aspect of R. Thigh and posterior aspect of left knee. Flat erythematous rash to anterior aspect of R. And left shin.  NECK: Trachea midline. Full ROM w/o pain or tenderness. No lymphadenopathy.  RESPIRATORY: Chest wall symmetrical. Respirations even and non-labored. Breath sounds clear to auscultation bilaterally.  CARDIAC: S1, S2 present, regular rate and rhythm. Peripheral pulses 2+ bilaterally.  MSK: Muscle tone and strength appropriate for age. Joints w/o tenderness, redness, or swelling. EXTREMITIES: Without clubbing, cyanosis, or edema.  NEUROLOGIC: No motor or sensory deficits. Steady, even gait.  PSYCH/MENTAL STATUS: Alert, oriented x 3. Cooperative, appropriate mood and affect.    No results found for any visits on 10/31/22.    Assessment & Plan:  1. Cervical cancer screening - Cytology -  PAP  2. Irritant contact dermatitis due to other agents Apply Cortisone to affected area around ankles, then apply Vaseline or heavy moisturizer   3. Bug bite, initial encounter   Lab Orders  No laboratory test(s) ordered today   No images are attached to the encounter or orders placed in the encounter.  Return for Scheduled Routine Office Visits and as needed.   Salvatore Decent, FNP

## 2022-11-01 LAB — CYTOLOGY - PAP
Comment: NEGATIVE
Diagnosis: NEGATIVE
High risk HPV: NEGATIVE

## 2022-11-06 ENCOUNTER — Ambulatory Visit
Admission: EM | Admit: 2022-11-06 | Discharge: 2022-11-06 | Disposition: A | Discharge status: Home / Self Care | Payer: PPO | Attending: Internal Medicine | Admitting: Internal Medicine

## 2022-11-06 DIAGNOSIS — N3000 Acute cystitis without hematuria: Secondary | ICD-10-CM | POA: Diagnosis not present

## 2022-11-06 MED ORDER — CEPHALEXIN 500 MG PO CAPS
500.0000 mg | ORAL_CAPSULE | Freq: Three times a day (TID) | ORAL | 0 refills | Status: AC
Start: 2022-11-06 — End: 2022-11-13

## 2022-11-06 NOTE — Discharge Instructions (Signed)
The clinical send your urine for culture and contact you with those results if positive.  Start Keflex 3 times a day for 7 days.  Continue hydration and over-the-counter Pyridium as needed.  Please follow-up with your PCP in 2 days for recheck.  Please go to the ER for any worsening symptoms.  I hope you feel better soon!

## 2022-11-06 NOTE — ED Triage Notes (Signed)
Pt presents to UC w/ c/o urinary pressure, dysuria, urinary frequency x2 days. Pt on azo

## 2022-11-06 NOTE — ED Provider Notes (Signed)
[facility]    CSN: [id number] Arrival date & time: 11/06/22  1124      History   Chief Complaint No chief complaint on file.   HPI Monique Rangel is a 67 y.o. female presents for evaluation of dysuria.  Patient reports 2 days of urinary burning, urgency, frequency.  Denies hematuria, fevers, nausea/vomiting.  Does endorse some back pain.  No vaginal discharge or STD concern.  Does have a history of recurrent UTIs.  She has been taking Pyridium OTC for symptoms.  No other concerns at this time.  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/20/2022   Herpes labialis 09/06/2021   SOB (shortness of breath) 09/06/2021   Slow transit constipation 05/25/2021   Gastroesophageal reflux disease 05/21/2021   Epigastric pain 05/21/2021   Gastroenteritis 05/21/2021   Lipoma of buttock 04/27/2021   Seborrheic keratosis 04/27/2021   Viral upper respiratory tract infection 02/10/2021   History of torn meniscus of left knee 11/26/2020   Primary osteoarthritis of left knee 11/26/2020   Chronic pain of both knees 05/12/2020   Onychomycosis 05/12/2020   GAD (generalized anxiety disorder) 04/14/2020   Asymptomatic postmenopausal estrogen deficiency 04/14/2020   DDD (degenerative disc disease), cervical 04/14/2020   Migraine 04/14/2020   Macular degeneration, bilateral 04/14/2020   Acquired mallet  deformity of finger of left hand 11/05/2018   Sensorineural hearing loss (SNHL) of both ears 02/15/2017   Mild intermittent asthma without complication 03/14/2000    Past Surgical History:  Procedure Laterality Date   EYE SURGERY  both cataracts    OB History   No obstetric history on file.      Home Medications    Prior to Admission medications   Medication Sig Start Date End Date Taking? Authorizing Provider  albuterol (VENTOLIN HFA) 108 (90 Base) MCG/ACT inhaler Inhale 1-2 puffs into the lungs every 6 (six) hours as needed for wheezing or shortness of breath. 06/17/22   Mliss Sax, MD  ALPRAZolam Prudy Feeler) 0.25 MG tablet Take one 1/2-hour before procedure. 10/07/22   Mliss Sax, MD  Biotin (BIOTIN MAXIMUM STRENGTH) 10 MG TABS Take by mouth. 10/19/18   [provider]  cephALEXin (KEFLEX) 500 MG capsule Take 1 capsule (500 mg total) by mouth 3 (three) times daily for 7 days. 11/06/22 11/13/22 Yes Radford Pax, NP  diclofenac Sodium (VOLTAREN) 1 % GEL Apply a small grape sized amount to right knee every 6 hours as needed. 11/26/20   Mliss Sax, MD  diphenhydrAMINE (BENADRYL) 25 MG tablet Take 25 mg by mouth at bedtime as needed.    [provider]  DULoxetine (CYMBALTA) 20 MG capsule TAKE 2 CAPSULES BY MOUTH DAILY Patient taking differently: Take 40 mg by mouth daily. Changed to 40 mg 09/12/22   Mliss Sax, MD  fluticasone [facility]) 50 MCG/ACT nasal spray Place 2 sprays into both nostrils  every morning.    [provider]  glucosamine-chondroitin 500-400 MG tablet Take 1 tablet by mouth 3 (three) times daily.    [provider]  Multiple Vitamin (MULTI-VITAMIN) tablet Take by mouth. 07/11/19   [provider]  Multiple Vitamins-Minerals (PRESERVISION AREDS PO) Take by mouth.    [provider]  omeprazole (PRILOSEC) 40 MG capsule TAKE 1 CAPSULE BY MOUTH DAILY 03/28/22   Mliss Sax, MD   phenazopyridine (PYRIDIUM) 200 MG tablet Take 1 tablet (200 mg total) by mouth 3 (three) times daily as needed for pain. Patient not taking: Reported on 10/07/2022 09/22/22   Garnette Gunner, MD    Family History Family History  Problem Relation Age of Onset   Hearing loss Mother    Cancer Father    Diabetes Father    Arthritis Maternal Aunt    Vision loss Maternal Aunt    Breast cancer Neg Hx     Social History Social History   Tobacco Use   Smoking status: Never   Smokeless tobacco: Never  Vaping Use   Vaping status: Never Used  Substance Use Topics   Alcohol use: Yes    Alcohol/week: 5.0 standard drinks of alcohol    Types: 5 Glasses of wine per week    Comment: usually with a meal   Drug use: Never     Allergies   Lamisil [terbinafine] and Quinine   Review of Systems Review of Systems  Genitourinary:  Positive for dysuria.     Physical Exam Triage Vital Signs ED Triage Vitals  Encounter Vitals Group     BP 11/06/22 1145 131/82     Systolic BP Percentile --      Diastolic BP Percentile --      Pulse Rate 11/06/22 1145 85     Resp 11/06/22 1145 16     Temp 11/06/22 1145 97.9 F (36.6 C)     Temp Source 11/06/22 1145 Oral     SpO2 11/06/22 1145 95 %     Weight --      Height --      Head Circumference --      Peak Flow --      Pain Score 11/06/22 1149 0     Pain Loc --      Pain Education --      Exclude from Growth Chart --    No data found.  Updated Vital Signs BP 131/82 (BP Location: Left Arm)   Pulse 85   Temp 97.9 F (36.6 C) (Oral)   Resp 16   SpO2 95%   Visual Acuity Right Eye Distance:   Left Eye Distance:   Bilateral Distance:    Right Eye Near:   Left Eye Near:    Bilateral Near:     Physical Exam Vitals and nursing note reviewed.  Constitutional:      Appearance: Normal appearance.  HENT:     Head: Normocephalic and atraumatic.  Eyes:     Pupils: Pupils are equal, round, and reactive to light.  Cardiovascular:      Rate and Rhythm: Normal rate.  Pulmonary:     Effort: Pulmonary effort is normal.  Abdominal:     Tenderness: There is left CVA tenderness. There is no right CVA tenderness.  Skin:    General: Skin is warm and dry.  Neurological:     General: No focal deficit present.     Mental Status: She is alert and oriented to person, place,  and time.  Psychiatric:        Mood and Affect: Mood normal.        Behavior: Behavior normal.      UC Treatments / Results  Labs (all labs ordered are listed, but only abnormal results are displayed) Labs Reviewed  URINE CULTURE  POCT URINALYSIS DIP (MANUAL ENTRY)    EKG   Radiology No results found.  Procedures Procedures (including critical care time)  Medications Ordered in UC Medications - No data to display  Initial Impression / Assessment and Plan / UC Course  I have reviewed the triage vital signs and the nursing notes.  Pertinent labs & imaging results that were available during my care of the patient were reviewed by me and considered in my medical decision making (see chart for details).     Reviewed exam and symptoms with patient no red flags.  Patient only able to leave small amount of urine in clinic.  Not enough for both point-of-care testing as well as a culture.  Will defer point-of-care testing as patient has taken Azo and just send urine for culture.  Will treat based on symptoms with Keflex.  Encouraged fluids and rest.  PCP follow-up 2 days for recheck.  ER precautions reviewed and patient verbalized understanding. Final Clinical Impressions(s) / UC Diagnoses   Final diagnoses:  Acute cystitis without hematuria     Discharge Instructions      The clinical send your urine for culture and contact you with those results if positive.  Start Keflex 3 times a day for 7 days.  Continue hydration and over-the-counter Pyridium as needed.  Please follow-up with your PCP in 2 days for recheck.  Please go to the ER for any  worsening symptoms.  I hope you feel better soon!    ED Prescriptions     Medication Sig Dispense Auth. Provider   cephALEXin (KEFLEX) 500 MG capsule Take 1 capsule (500 mg total) by mouth 3 (three) times daily for 7 days. 21 capsule Radford Pax, NP      PDMP not reviewed this encounter.   Radford Pax, NP 11/06/22 [phone]

## 2022-11-07 ENCOUNTER — Telehealth: Payer: Self-pay | Admitting: Family Medicine

## 2022-11-07 NOTE — Telephone Encounter (Signed)
error 

## 2022-11-08 LAB — URINE CULTURE: Culture: 20000 — AB

## 2022-11-10 ENCOUNTER — Ambulatory Visit (INDEPENDENT_AMBULATORY_CARE_PROVIDER_SITE_OTHER): Payer: PPO | Admitting: Family Medicine

## 2022-11-10 ENCOUNTER — Encounter: Payer: Self-pay | Admitting: Family Medicine

## 2022-11-10 VITALS — BP 122/84 | HR 82 | Temp 96.9°F | Ht 66.0 in | Wt 191.8 lb

## 2022-11-10 DIAGNOSIS — E7800 Pure hypercholesterolemia, unspecified: Secondary | ICD-10-CM

## 2022-11-10 DIAGNOSIS — R300 Dysuria: Secondary | ICD-10-CM

## 2022-11-10 DIAGNOSIS — R7303 Prediabetes: Secondary | ICD-10-CM

## 2022-11-10 LAB — POCT URINALYSIS DIPSTICK
Bilirubin, UA: NEGATIVE
Blood, UA: NEGATIVE
Glucose, UA: NEGATIVE
Ketones, UA: NEGATIVE
Leukocytes, UA: NEGATIVE
Nitrite, UA: NEGATIVE
Protein, UA: POSITIVE — AB
Spec Grav, UA: 1.02 (ref 1.010–1.025)
Urobilinogen, UA: 0.2 E.U./dL
pH, UA: 6.5 (ref 5.0–8.0)

## 2022-11-10 NOTE — Progress Notes (Signed)
Established Patient Office Visit   Subjective:  Patient ID: Monique Rangel, female    DOB: [birth year]  Age: 67 y.o. MRN: [id number]  Chief Complaint  Patient presents with   Urinary Tract Infection    Follow up Urgent care follow up for UTI. Pt states she feels better. But still has left lower back soreness. Pt had dizziness and headaches x 1 weeks as well.     Urinary Tract Infection  Pertinent negatives include no chills or flank pain.   Encounter Diagnoses  Name Primary?   Dysuria Yes   Elevated LDL cholesterol level    Prediabetes    For follow-up of a recent UTI with a culture positive for Enterococcus that grew out 20,000 colony-forming units.  Treated with Keflex.  She is doing better.  Dysuria has resolved.  No fevers or chills.  Left CVA tenderness has resolved.   Review of Systems  Constitutional: Negative.  Negative for chills and fever.  HENT: Negative.    Eyes:  Negative for blurred vision, discharge and redness.  Respiratory: Negative.    Cardiovascular: Negative.   Gastrointestinal:  Negative for abdominal pain.  Genitourinary: Negative.  Negative for dysuria and flank pain.  Musculoskeletal: Negative.  Negative for myalgias.  Skin:  Negative for rash.  Neurological:  Negative for tingling, loss of consciousness and weakness.  Endo/Heme/Allergies:  Negative for polydipsia.     Current Outpatient Medications:    albuterol (VENTOLIN HFA) 108 (90 Base) MCG/ACT inhaler, Inhale 1-2 puffs into the lungs every 6 (six) hours as needed for wheezing or shortness of breath., Disp: 8 g, Rfl: 0   ALPRAZolam (XANAX) 0.25 MG tablet, Take one 1/2-hour before procedure., Disp: 1 tablet, Rfl: 0   Biotin (BIOTIN MAXIMUM STRENGTH) 10 MG TABS, Take by mouth., Disp: , Rfl:    cephALEXin (KEFLEX) 500 MG capsule, Take 1 capsule (500 mg total) by mouth 3 (three) times daily for 7 days., Disp: 21 capsule, Rfl: 0   diclofenac Sodium (VOLTAREN) 1 % GEL, Apply a small grape sized  amount to right knee every 6 hours as needed., Disp: 150 g, Rfl: 1   diphenhydrAMINE (BENADRYL) 25 MG tablet, Take 25 mg by mouth at bedtime as needed., Disp: , Rfl:    DULoxetine (CYMBALTA) 20 MG capsule, TAKE 2 CAPSULES BY MOUTH DAILY (Patient taking differently: Take 40 mg by mouth daily. Changed to 40 mg), Disp: 60 capsule, Rfl: 1   fluticasone (FLONASE) 50 MCG/ACT nasal spray, Place 2 sprays into both nostrils every morning., Disp: , Rfl:    glucosamine-chondroitin 500-400 MG tablet, Take 1 tablet by mouth 3 (three) times daily., Disp: , Rfl:    Multiple Vitamin (MULTI-VITAMIN) tablet, Take by mouth., Disp: , Rfl:    Multiple Vitamins-Minerals (PRESERVISION AREDS PO), Take by mouth., Disp: , Rfl:    omeprazole (PRILOSEC) 40 MG capsule, TAKE 1 CAPSULE BY MOUTH DAILY, Disp: 90 capsule, Rfl: 1   phenazopyridine (PYRIDIUM) 200 MG tablet, Take 1 tablet (200 mg total) by mouth 3 (three) times daily as needed for pain., Disp: 10 tablet, Rfl: 0   Objective:     BP 122/84   Pulse 82   Temp (!) 96.9 F (36.1 C)   Ht 5\' 6"  (1.676 m)   Wt 191 lb 12.8 oz (87 kg)   SpO2 95%   BMI 30.96 kg/m    Physical Exam Constitutional:      General: She is not in acute distress.    Appearance: Normal appearance.  She is not ill-appearing, toxic-appearing or diaphoretic.  HENT:     Head: Normocephalic and atraumatic.     Right Ear: External ear normal.     Left Ear: External ear normal.     Mouth/Throat:     Mouth: Mucous membranes are moist.     Pharynx: Oropharynx is clear. No oropharyngeal exudate or posterior oropharyngeal erythema.  Eyes:     General: No scleral icterus.       Right eye: No discharge.        Left eye: No discharge.     Extraocular Movements: Extraocular movements intact.     Conjunctiva/sclera: Conjunctivae normal.     Pupils: Pupils are equal, round, and reactive to light.  Cardiovascular:     Rate and Rhythm: Normal rate and regular rhythm.  Pulmonary:     Effort:  Pulmonary effort is normal. No respiratory distress.     Breath sounds: Normal breath sounds.  Abdominal:     General: Bowel sounds are normal.     Tenderness: There is no right CVA tenderness or left CVA tenderness.  Musculoskeletal:     Cervical back: No rigidity or tenderness.     Lumbar back: No tenderness or bony tenderness. Normal range of motion. Negative right straight leg raise test and negative left straight leg raise test.  Skin:    General: Skin is warm and dry.  Neurological:     Mental Status: She is alert and oriented to person, place, and time.     Motor: No weakness.     Deep Tendon Reflexes:     Reflex Scores:      Patellar reflexes are 2+ on the right side and 2+ on the left side.      Achilles reflexes are 1+ on the right side and 1+ on the left side. Psychiatric:        Mood and Affect: Mood normal.        Behavior: Behavior normal.      Results for orders placed or performed in visit on 11/10/22  POCT Urinalysis Dipstick  Result Value Ref Range   Color, UA yellow    Clarity, UA clear    Glucose, UA Negative Negative   Bilirubin, UA neg    Ketones, UA neg    Spec Grav, UA 1.020 1.010 - 1.025   Blood, UA neg    pH, UA 6.5 5.0 - 8.0   Protein, UA Positive (A) Negative   Urobilinogen, UA 0.2 0.2 or 1.0 E.U./dL   Nitrite, UA neg    Leukocytes, UA Negative Negative   Appearance     Odor        The 10-year ASCVD risk score (Arnett DK, et al., 2019) is: 6.4%    Assessment & Plan:   Dysuria -     POCT urinalysis dipstick  Elevated LDL cholesterol level  Prediabetes    Return if symptoms worsen or fail to improve.  UTI is resolved.  Encouraged adequate hydration with 48 to 64 fluid ounces of water daily.  Continue exercise and weight loss for control of prediabetes.  Discussed starting metformin as a preventative agent.  She would like to avoid a medication for now.  Elevated LDL with favorable HDL level.  ASCVD risk score is less than 7.  She  is adopting a low-fat low-cholesterol diet.  Suggested the Mediterranean diet.  Information on prediabetes.  Mliss Sax, MD

## 2022-11-10 NOTE — Progress Notes (Unsigned)
[facility] [address] [place] [address] Phone: [phone] Subjective:   Bruce Donath, am serving as a scribe for Dr. Antoine Primas.  I'm seeing this patient by the request  of:  Mliss Sax, MD  CC: Back pain, neck pain  [id number]  Lorenna Gurman Beamon is a 67 y.o. female coming in with complaint of cervical spine pain. Patient states that she has good and bad days. Little pain today. Pain occurring in R side of cervical spine that radiates into the R side of the skull. Did formal physical therapy which was helpful but she has unfortunately had pain intermittently for the past 10 years. Would like to try gabapentin at night. 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     Patient did have neck x-rays in 2022 that were independently visualized by me showing the patient does have degenerative disc disease mostly at C5-C6 moderate in severity with moderate facet arthropathy.  Past Medical History:  Diagnosis Date   Allergy    Anxiety 05/11/2016   Past Surgical History:  Procedure Laterality Date   EYE SURGERY  both cataracts   Social History   Socioeconomic History   Marital status: Married    Spouse name: Not on file   Number of children: 2   Years of education: Not on file   Highest education level: Not on file  Occupational History   Not on file  Tobacco Use   Smoking status: Never   Smokeless tobacco: Never  Vaping Use   Vaping status: Never Used  Substance and Sexual Activity   Alcohol use: Yes    Alcohol/week: 5.0 standard drinks of alcohol    Types: 5 Glasses of wine per week    Comment: usually with a meal   Drug use: Never   Sexual activity: Not Currently    Birth control/protection: Post-menopausal  Other Topics Concern   Not on file  Social History Narrative   Not on file   Social Determinants of Health   Financial Resource Strain: Low Risk  (03/28/2022)   Overall  Financial Resource Strain (CARDIA)    Difficulty of Paying Living Expenses: Not hard at all  Food Insecurity: No Food Insecurity (03/28/2022)   Hunger Vital Sign    Worried About Running Out of Food in the Last Year: Never true    Ran Out of Food in the Last Year: Never true  Transportation Needs: No Transportation Needs (10/07/2022)   PRAPARE - Administrator, Civil Service (Medical): No    Lack of Transportation (Non-Medical): No  Physical Activity: Inactive (03/28/2022)   Exercise Vital Sign    Days of Exercise per Week: 0 days    Minutes of Exercise per Session: 0 min  Stress: No Stress Concern Present (03/28/2022)   [employer] of Occupational Health - Occupational Stress Questionnaire    Feeling of Stress : Not at all  Social Connections: Not on file   Allergies  Allergen Reactions   Lamisil [Terbinafine] Rash   Quinine Other (See Comments)    Mother had a bad reaction so does not want to use it Mother had a bad reaction so does not want to use it    Family History  Problem Relation Age of Onset   Hearing loss Mother    Cancer Father    Diabetes Father    Arthritis Maternal Aunt    Vision loss Maternal Aunt  Breast cancer Neg Hx       Current Outpatient Medications (Respiratory):    albuterol (VENTOLIN HFA) 108 (90 Base) MCG/ACT inhaler, Inhale 1-2 puffs into the lungs every 6 (six) hours as needed for wheezing or shortness of breath.   diphenhydrAMINE (BENADRYL) 25 MG tablet, Take 25 mg by mouth at bedtime as needed.   fluticasone (FLONASE) 50 MCG/ACT nasal spray, Place 2 sprays into both nostrils every morning.    Current Outpatient Medications (Other):    gabapentin (NEURONTIN) 100 MG capsule, Take 2 capsules (200 mg total) by mouth at bedtime.   ALPRAZolam (XANAX) 0.25 MG tablet, Take one 1/2-hour before procedure.   Biotin (BIOTIN MAXIMUM STRENGTH) 10 MG TABS, Take by mouth.   diclofenac Sodium (VOLTAREN) 1 % GEL, Apply a small grape sized  amount to right knee every 6 hours as needed.   DULoxetine (CYMBALTA) 20 MG capsule, TAKE 2 CAPSULES BY MOUTH DAILY (Patient taking differently: Take 40 mg by mouth daily. Changed to 40 mg)   glucosamine-chondroitin 500-400 MG tablet, Take 1 tablet by mouth 3 (three) times daily.   Multiple Vitamin (MULTI-VITAMIN) tablet, Take by mouth.   Multiple Vitamins-Minerals (PRESERVISION AREDS PO), Take by mouth.   omeprazole (PRILOSEC) 40 MG capsule, TAKE 1 CAPSULE BY MOUTH DAILY   phenazopyridine (PYRIDIUM) 200 MG tablet, Take 1 tablet (200 mg total) by mouth 3 (three) times daily as needed for pain.   Reviewed prior external information including notes and imaging from  primary care provider As well as notes that were available from care everywhere and other healthcare systems.  Past medical history, social, surgical and family history all reviewed in electronic medical record.  No pertanent information unless stated regarding to the chief complaint.   Review of Systems:  No headache, visual changes, nausea, vomiting, diarrhea, constipation, dizziness, abdominal pain, skin rash, fevers, chills, night sweats, weight loss, swollen lymph nodes, body aches, joint swelling, chest pain, shortness of breath, mood changes. POSITIVE muscle aches  Objective  Blood pressure 114/82, pulse 65, height 5\' 6"  (1.676 m), weight 194 lb (88 kg), SpO2 98%.   General: No apparent distress alert and oriented x3 mood and affect normal, dressed appropriately.  HEENT: Pupils equal, extraocular movements intact  Respiratory: Patient's speak in full sentences and does not appear short of breath  Cardiovascular: No lower extremity edema, non tender, no erythema  Neck exam shows some mild loss of lordosis noted.  Some tightness noted especially with left-sided sidebending and left-sided rotation.  Mild crepitus.  Bilateral 5 strength of the upper extremities.   [id number]; 15 additional minutes spent for Therapeutic exercises  as stated in above notes.  This included exercises focusing on stretching, strengthening, with significant focus on eccentric aspects.   Long term goals include an improvement in range of motion, strength, endurance as well as avoiding reinjury. Patient's frequency would include in 1-2 times a day, 3-5 times a week for a duration of 6-12 weeks. Exercises that included:  Basic scapular stabilization to include adduction and depression of scapula Scaption, focusing on proper movement and good control Internal and External rotation utilizing a theraband, with elbow tucked at side entire time Rows with theraband    Proper technique shown and discussed handout in great detail with ATC.  All questions were discussed and answered.      Impression and Recommendations:    The above documentation has been reviewed and is accurate and complete Judi Saa, DO

## 2022-11-16 ENCOUNTER — Ambulatory Visit: Payer: PPO | Admitting: Family Medicine

## 2022-11-16 VITALS — BP 114/82 | HR 65 | Ht 66.0 in | Wt 194.0 lb

## 2022-11-16 DIAGNOSIS — M2550 Pain in unspecified joint: Secondary | ICD-10-CM | POA: Diagnosis not present

## 2022-11-16 DIAGNOSIS — M5030 Other cervical disc degeneration, unspecified cervical region: Secondary | ICD-10-CM | POA: Diagnosis not present

## 2022-11-16 LAB — SEDIMENTATION RATE: Sed Rate: 12 mm/h (ref 0–30)

## 2022-11-16 LAB — VITAMIN D 25 HYDROXY (VIT D DEFICIENCY, FRACTURES): VITD: 29.61 ng/mL — ABNORMAL LOW (ref 30.00–100.00)

## 2022-11-16 LAB — IBC PANEL
Iron: 81 ug/dL (ref 42–145)
Saturation Ratios: 23.1 % (ref 20.0–50.0)
TIBC: 350 ug/dL (ref 250.0–450.0)
Transferrin: 250 mg/dL (ref 212.0–360.0)

## 2022-11-16 LAB — FERRITIN: Ferritin: 48.2 ng/mL (ref 10.0–291.0)

## 2022-11-16 LAB — VITAMIN B12: Vitamin B-12: 607 pg/mL (ref 211–911)

## 2022-11-16 MED ORDER — GABAPENTIN 100 MG PO CAPS: 200.0000 mg | ORAL_CAPSULE | Freq: Every day | ORAL | 0 refills | Status: DC

## 2022-11-16 NOTE — Patient Instructions (Signed)
Exercises Turmeric 500mg  daily  Tart cherry extract 1200mg  at night Vitamin D 2000 IU daily  See me again in 6-8 weeks

## 2022-11-17 ENCOUNTER — Encounter: Payer: Self-pay | Admitting: Family Medicine

## 2022-11-17 NOTE — Assessment & Plan Note (Signed)
Patient has had difficulty and will get further x-rays to further evaluate.  Discussed different medications including a low-dose of gabapentin.  Patient work with Event organiser to learn home exercises in greater detail.  If necessary we can consider the possibility of repeating formal physical therapy but patient has done that in the past.  The patient continues to have some difficulty or worsening radicular symptoms advanced imaging may be warranted.  Follow-up with me again in 6 weeks.  Could be also a candidate for possible osteopathic manipulation depending

## 2022-11-20 LAB — ANTI-NUCLEAR AB-TITER (ANA TITER): ANA Titer 1: 1:160 {titer} — ABNORMAL HIGH

## 2022-11-20 LAB — PTH, INTACT AND CALCIUM
Calcium: 9.1 mg/dL (ref 8.6–10.4)
PTH: 72 pg/mL (ref 16–77)

## 2022-11-20 LAB — RHEUMATOID FACTOR: Rheumatoid fact SerPl-aCnc: <10 [IU]/mL (ref <14)

## 2022-11-20 LAB — ANA: Anti Nuclear Antibody (ANA): POSITIVE — AB

## 2022-11-21 ENCOUNTER — Other Ambulatory Visit: Payer: Self-pay

## 2022-11-21 ENCOUNTER — Telehealth: Payer: Self-pay | Admitting: Family Medicine

## 2022-11-21 ENCOUNTER — Other Ambulatory Visit: Payer: Self-pay | Admitting: Family Medicine

## 2022-11-21 DIAGNOSIS — F411 Generalized anxiety disorder: Secondary | ICD-10-CM

## 2022-11-21 DIAGNOSIS — R768 Other specified abnormal immunological findings in serum: Secondary | ICD-10-CM

## 2022-11-21 NOTE — Telephone Encounter (Signed)
Referral placed.

## 2022-11-21 NOTE — Telephone Encounter (Signed)
Per Dr. Katrinka Blazing notes in recent labs, pt would like to be referred to Rheumatology.

## 2022-12-22 NOTE — Progress Notes (Unsigned)
[facility] [address] [place] [address] Phone: [phone] Subjective:   INadine Counts, am serving as a scribe for Dr. Antoine Primas.  I'm seeing this patient by the request  of:  Mliss Sax, MD  CC: neck pain   [id number]  11/16/2022 Patient has had difficulty and will get further x-rays to further evaluate. Discussed different medications including a low-dose of gabapentin. Patient work with Event organiser to learn home exercises in greater detail. If necessary we can consider the possibility of repeating formal physical therapy but patient has done that in the past. The patient continues to have some difficulty or worsening radicular symptoms advanced imaging may be warranted. Follow-up with me again in 6 weeks. Could be also a candidate for possible osteopathic manipulation depending   Updated 12/27/2022 Monique Rangel is a 67 y.o. female coming in with complaint of neck pain and polyarthralgia. Following up on all prior conversations. Cannot take tumeric. Gave her GI issues.    Labs showed positive ANA, awaiting rheum   Past Medical History:  Diagnosis Date   Allergy    Anxiety 05/11/2016   Past Surgical History:  Procedure Laterality Date   EYE SURGERY  both cataracts   Social History   Socioeconomic History   Marital status: Married    Spouse name: Not on file   Number of children: 2   Years of education: Not on file   Highest education level: Not on file  Occupational History   Not on file  Tobacco Use   Smoking status: Never   Smokeless tobacco: Never  Vaping Use   Vaping status: Never Used  Substance and Sexual Activity   Alcohol use: Yes    Alcohol/week: 5.0 standard drinks of alcohol    Types: 5 Glasses of wine per week    Comment: usually with a meal   Drug use: Never   Sexual activity: Not Currently    Birth control/protection: Post-menopausal  Other Topics Concern   Not on file  Social  History Narrative   Not on file   Social Determinants of Health   Financial Resource Strain: Low Risk  (03/28/2022)   Overall Financial Resource Strain (CARDIA)    Difficulty of Paying Living Expenses: Not hard at all  Food Insecurity: No Food Insecurity (03/28/2022)   Hunger Vital Sign    Worried About Running Out of Food in the Last Year: Never true    Ran Out of Food in the Last Year: Never true  Transportation Needs: No Transportation Needs (10/07/2022)   PRAPARE - Administrator, Civil Service (Medical): No    Lack of Transportation (Non-Medical): No  Physical Activity: Inactive (03/28/2022)   Exercise Vital Sign    Days of Exercise per Week: 0 days    Minutes of Exercise per Session: 0 min  Stress: No Stress Concern Present (03/28/2022)   [employer] of Occupational Health - Occupational Stress Questionnaire    Feeling of Stress : Not at all  Social Connections: Not on file   Allergies  Allergen Reactions   Lamisil [Terbinafine] Rash   Quinine Other (See Comments)    Mother had a bad reaction so does not want to use it Mother had a bad reaction so does not want to use it    Family History  Problem Relation Age of Onset   Hearing loss Mother    Cancer Father    Diabetes Father    Arthritis  Maternal Aunt    Vision loss Maternal Aunt    Breast cancer Neg Hx       Current Outpatient Medications (Respiratory):    albuterol (VENTOLIN HFA) 108 (90 Base) MCG/ACT inhaler, Inhale 1-2 puffs into the lungs every 6 (six) hours as needed for wheezing or shortness of breath.   diphenhydrAMINE (BENADRYL) 25 MG tablet, Take 25 mg by mouth at bedtime as needed.   fluticasone (FLONASE) 50 MCG/ACT nasal spray, Place 2 sprays into both nostrils every morning.    Current Outpatient Medications (Other):    ALPRAZolam (XANAX) 0.25 MG tablet, Take one 1/2-hour before procedure.   Biotin (BIOTIN MAXIMUM STRENGTH) 10 MG TABS, Take by mouth.   diclofenac Sodium (VOLTAREN)  1 % GEL, Apply a small grape sized amount to right knee every 6 hours as needed.   DULoxetine (CYMBALTA) 20 MG capsule, TAKE 2 CAPSULES BY MOUTH DAILY   gabapentin (NEURONTIN) 100 MG capsule, Take 2 capsules (200 mg total) by mouth at bedtime.   glucosamine-chondroitin 500-400 MG tablet, Take 1 tablet by mouth 3 (three) times daily.   Multiple Vitamin (MULTI-VITAMIN) tablet, Take by mouth.   Multiple Vitamins-Minerals (PRESERVISION AREDS PO), Take by mouth.   omeprazole (PRILOSEC) 40 MG capsule, TAKE 1 CAPSULE BY MOUTH DAILY   phenazopyridine (PYRIDIUM) 200 MG tablet, Take 1 tablet (200 mg total) by mouth 3 (three) times daily as needed for pain.   Reviewed prior external information including notes and imaging from  primary care provider As well as notes that were available from care everywhere and other healthcare systems.  Past medical history, social, surgical and family history all reviewed in electronic medical record.  No pertanent information unless stated regarding to the chief complaint.   Review of Systems:  No headache, visual changes, nausea, vomiting, diarrhea, constipation, dizziness, abdominal pain, skin rash, fevers, chills, night sweats, weight loss, swollen lymph nodes,  chest pain, shortness of breath, mood changes. POSITIVE muscle aches, body aches, joint swelling  Objective  Blood pressure 112/74, pulse 84, height 5\' 6"  (1.676 m), weight 199 lb (90.3 kg), SpO2 96%.   General: No apparent distress alert and oriented x3 mood and affect normal, dressed appropriately.  HEENT: Pupils equal, extraocular movements intact  Respiratory: Patient's speak in full sentences and does not appear short of breath  Cardiovascular: No lower extremity edema, non tender, no erythema  Patient is sitting comfortably at this time.  Does have significant arthritic changes of the knees noted.  Some mild crepitus noted.  Does have some swelling of the third finger at the PIP joint.  Does have  full range of motion.  Does seem that some of the swelling of the other joints seems to be improved.    Impression and Recommendations:    The above documentation has been reviewed and is accurate and complete Judi Saa, DO

## 2022-12-27 ENCOUNTER — Ambulatory Visit: Payer: PPO | Admitting: Family Medicine

## 2022-12-27 VITALS — BP 112/74 | HR 84 | Ht 66.0 in | Wt 199.0 lb

## 2022-12-27 DIAGNOSIS — M25561 Pain in right knee: Secondary | ICD-10-CM

## 2022-12-27 DIAGNOSIS — M25562 Pain in left knee: Secondary | ICD-10-CM | POA: Diagnosis not present

## 2022-12-27 DIAGNOSIS — G8929 Other chronic pain: Secondary | ICD-10-CM | POA: Diagnosis not present

## 2022-12-27 DIAGNOSIS — M1712 Unilateral primary osteoarthritis, left knee: Secondary | ICD-10-CM

## 2022-12-27 NOTE — Patient Instructions (Signed)
Good to see you! Lets not change anything except for buddy taping finger with certain activities See you again in 3 months

## 2022-12-27 NOTE — Assessment & Plan Note (Addendum)
Patient does have a positive ANA and concern for potential underlying rheumatoid arthritis.  Does have an aunt with it.  Awaiting the referral and will be seeing rheumatology in February.  No sooner appointments available.  Discussed with patient that she would like to continue with the conservative therapy and the vitamin supplementations.  We will continue to monitor closely.  Discussed which activities to do and which ones to avoid.  Follow-up again in 3 months to further check-in.

## 2022-12-27 NOTE — Assessment & Plan Note (Signed)
Known arthritic changes of the knees bilaterally.  Wants to hold on any type of type of injections at the moment and seems to be doing okay.  Discussed which activities to do and which ones to avoid.  Increase activity slowly otherwise.  Follow-up with me again in 6 to 8 weeks.

## 2023-02-10 ENCOUNTER — Other Ambulatory Visit: Payer: Self-pay | Admitting: Family Medicine

## 2023-02-14 DIAGNOSIS — I77810 Thoracic aortic ectasia: Secondary | ICD-10-CM | POA: Diagnosis not present

## 2023-02-15 LAB — BASIC METABOLIC PANEL
BUN/Creatinine Ratio: 23 (ref 12–28)
BUN: 15 mg/dL (ref 8–27)
CO2: 23 mmol/L (ref 20–29)
Calcium: 9 mg/dL (ref 8.7–10.3)
Chloride: 103 mmol/L (ref 96–106)
Creatinine, Ser: 0.64 mg/dL (ref 0.57–1.00)
Glucose: 150 mg/dL — ABNORMAL HIGH (ref 70–99)
Potassium: 4 mmol/L (ref 3.5–5.2)
Sodium: 141 mmol/L (ref 134–144)
eGFR: 97 mL/min/{1.73_m2} (ref >59)

## 2023-02-20 ENCOUNTER — Ambulatory Visit (HOSPITAL_BASED_OUTPATIENT_CLINIC_OR_DEPARTMENT_OTHER)
Admission: RE | Admit: 2023-02-20 | Discharge: 2023-02-20 | Disposition: A | Discharge status: Home / Self Care | Payer: PPO | Source: Ambulatory Visit | Attending: Cardiology | Admitting: Cardiology

## 2023-02-20 ENCOUNTER — Encounter (HOSPITAL_BASED_OUTPATIENT_CLINIC_OR_DEPARTMENT_OTHER): Payer: Self-pay

## 2023-02-20 DIAGNOSIS — I728 Aneurysm of other specified arteries: Secondary | ICD-10-CM | POA: Diagnosis not present

## 2023-02-20 DIAGNOSIS — I77810 Thoracic aortic ectasia: Secondary | ICD-10-CM | POA: Diagnosis not present

## 2023-02-20 MED ORDER — IOHEXOL 350 MG/ML SOLN
100.0000 mL | Freq: Once | INTRAVENOUS | Status: AC | PRN
  Administered 2023-02-20: 75 mL via INTRAVENOUS

## 2023-03-26 ENCOUNTER — Other Ambulatory Visit: Payer: Self-pay | Admitting: Family Medicine

## 2023-03-26 DIAGNOSIS — K219 Gastro-esophageal reflux disease without esophagitis: Secondary | ICD-10-CM

## 2023-03-27 ENCOUNTER — Ambulatory Visit (INDEPENDENT_AMBULATORY_CARE_PROVIDER_SITE_OTHER): Payer: PPO | Admitting: Family Medicine

## 2023-03-27 ENCOUNTER — Encounter: Payer: Self-pay | Admitting: Family Medicine

## 2023-03-27 VITALS — BP 114/74 | HR 98 | Temp 98.1°F | Ht 66.0 in | Wt 204.2 lb

## 2023-03-27 DIAGNOSIS — M79644 Pain in right finger(s): Secondary | ICD-10-CM

## 2023-03-27 DIAGNOSIS — R7303 Prediabetes: Secondary | ICD-10-CM

## 2023-03-27 DIAGNOSIS — E7800 Pure hypercholesterolemia, unspecified: Secondary | ICD-10-CM | POA: Diagnosis not present

## 2023-03-27 NOTE — Addendum Note (Signed)
 Addended by: Andrez Grime on: 03/27/2023 01:42 PM   Modules accepted: Orders

## 2023-03-27 NOTE — Progress Notes (Addendum)
 Established Patient Office Visit   Subjective:  Patient ID: Monique Rangel, female    DOB: 10/14/1955  Age: 68 y.o. MRN: [id number]  Chief Complaint  Patient presents with   Edema    Right hand edema x 2 months. Pt complains of soreness with edema.     HPI Encounter Diagnoses  Name Primary?   Elevated LDL cholesterol level Yes   Prediabetes    Pain of finger of right hand    Reports pain and swelling in her right third PIP after making Christmas stockings for her grandchildren this year.  Monique Rangel is right-hand dominant.  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  There is limited range of motion through the PIP and it is painful.  Sometimes it seems to get stuck.   Review of Systems  Constitutional: Negative.   HENT: Negative.    Eyes:  Negative for blurred vision, discharge and redness.  Respiratory: Negative.    Cardiovascular: Negative.   Gastrointestinal:  Negative for abdominal pain.  Genitourinary: Negative.   Musculoskeletal:  Positive for joint pain. Negative for myalgias.  Skin:  Negative for rash.  Neurological:  Negative for tingling, loss of consciousness and weakness.  Endo/Heme/Allergies:  Negative for polydipsia.     Current Outpatient Medications:    albuterol  (VENTOLIN  HFA) 108 (90 Base) MCG/ACT inhaler, Inhale 1-2 puffs into the lungs every 6 (six) hours as needed for wheezing or shortness of breath., Disp: 8 g, Rfl: 0   Biotin (BIOTIN MAXIMUM STRENGTH) 10 MG TABS, Take by mouth., Disp: , Rfl:    diclofenac  Sodium (VOLTAREN ) 1 % GEL, Apply a small grape sized amount to right knee every 6 hours as needed., Disp: 150 g, Rfl: 1   diphenhydrAMINE (BENADRYL) 25 MG tablet, Take 25 mg by mouth at bedtime as needed., Disp: , Rfl:    DULoxetine  (CYMBALTA ) 20 MG capsule, TAKE 2 CAPSULES BY MOUTH DAILY, Disp: 180 capsule, Rfl: 1   fluticasone (FLONASE) 50 MCG/ACT nasal spray, Place 2 sprays into both nostrils every morning.,  Disp: , Rfl:    gabapentin  (NEURONTIN ) 100 MG capsule, TAKE TWO CAPSULES BY MOUTH AT BEDTIME, Disp: 180 capsule, Rfl: 0   glucosamine-chondroitin 500-400 MG tablet, Take 1 tablet by mouth 3 (three) times daily., Disp: , Rfl:    Multiple Vitamin (MULTI-VITAMIN) tablet, Take by mouth., Disp: , Rfl:    Multiple Vitamins-Minerals (PRESERVISION AREDS PO), Take by mouth., Disp: , Rfl:    ALPRAZolam  (XANAX ) 0.25 MG tablet, Take one 1/2-hour before procedure. (Patient not taking: Reported on 03/27/2023), Disp: 1 tablet, Rfl: 0   omeprazole  (PRILOSEC) 40 MG capsule, TAKE 1 CAPSULE BY MOUTH DAILY, Disp: 90 capsule, Rfl: 1   phenazopyridine  (PYRIDIUM ) 200 MG tablet, Take 1 tablet (200 mg total) by mouth 3 (three) times daily as needed for pain. (Patient not taking: Reported on 03/27/2023), Disp: 10 tablet, Rfl: 0   Objective:     BP 114/74   Pulse 98   Temp 98.1 F (36.7 C)   Ht 5' 6 (1.676 m)   Wt 204 lb 3.2 oz (92.6 kg)   SpO2 95%   BMI 32.96 kg/m  Wt Readings from Last 3 Encounters:  03/27/23 204 lb 3.2 oz (92.6 kg)  12/27/22 199 lb (90.3 kg)  11/16/22 194 lb (88 kg)      Physical Exam Constitutional:      General: Monique Rangel is not in acute distress.    Appearance:  Normal appearance. Monique Rangel is not ill-appearing, toxic-appearing or diaphoretic.  HENT:     Head: Normocephalic and atraumatic.     Right Ear: External ear normal.     Left Ear: External ear normal.  Eyes:     General: No scleral icterus.       Right eye: No discharge.        Left eye: No discharge.     Extraocular Movements: Extraocular movements intact.     Conjunctiva/sclera: Conjunctivae normal.  Pulmonary:     Effort: Pulmonary effort is normal. No respiratory distress.  Musculoskeletal:     Right hand: Swelling and tenderness present. Decreased range of motion.     Comments: There is swelling and tenderness to palpation with decreased range of motion of the PIP of the right third finger.  No triggering demonstrated.   Skin:    General: Skin is warm and dry.  Neurological:     Mental Status: Monique Rangel is alert and oriented to person, place, and time.  Psychiatric:        Mood and Affect: Mood normal.        Behavior: Behavior normal.      No results found for any visits on 03/27/23.    The 10-year ASCVD risk score (Arnett DK, et al., 2019) is: 6.3%    Assessment & Plan:   Elevated LDL cholesterol level  Prediabetes  Pain of finger of right hand -     Ambulatory referral to Orthopedic Surgery -     Ambulatory referral to Orthopedic Surgery    Return in about 6 months (around 09/24/2023) for chronic disease follow-up, annual physical.  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  Information was given on the Mediterranean diet and prediabetes.  Follow-up in July.  Elsie Sim Lent, MD

## 2023-04-03 ENCOUNTER — Ambulatory Visit (INDEPENDENT_AMBULATORY_CARE_PROVIDER_SITE_OTHER): Payer: PPO

## 2023-04-03 DIAGNOSIS — Z0000 Encounter for general adult medical examination without abnormal findings: Secondary | ICD-10-CM | POA: Diagnosis not present

## 2023-04-03 NOTE — Progress Notes (Signed)
Subjective:   Monique Rangel is a 68 y.o. female who presents for Medicare Annual (Subsequent) preventive examination.  Visit Complete: Virtual I connected with  Monique Rangel on 04/03/23 by a audio enabled telemedicine application and verified that I am speaking with the correct person using two identifiers. Interactive audio and video telecommunications were attempted between this provider and patient, however failed, due to patient having technical difficulties OR patient did not have access to video capability.  We continued and completed visit with audio only.  Patient Location: Home  Provider Location: Office/Clinic  I discussed the limitations of evaluation and management by telemedicine. The patient expressed understanding and agreed to proceed.  Vital Signs: Because this visit was a virtual/telehealth visit, some criteria may be missing or patient reported. Any vitals not documented were not able to be obtained and vitals that have been documented are patient reported.    Cardiac Risk Factors include: advanced age (>39men, >47 women)     Objective:    Today's Vitals   There is no height or weight on file to calculate BMI.     04/03/2023    1:50 PM 03/28/2022   10:02 AM 06/03/2020    9:59 AM  Advanced Directives  Does Patient Have a Medical Advance Directive? Yes Yes No  Type of Estate agent of [place];Living will Healthcare Power of [place];Living will   Copy of Healthcare Power of Attorney in Chart? No - copy requested No - copy requested   Would patient like information on creating a medical advance directive?   No - Patient declined    Current Medications (verified) Outpatient Encounter Medications as of 04/03/2023  Medication Sig   albuterol (VENTOLIN HFA) 108 (90 Base) MCG/ACT inhaler Inhale 1-2 puffs into the lungs every 6 (six) hours as needed for wheezing or shortness of breath.   Biotin (BIOTIN MAXIMUM STRENGTH) 10 MG TABS Take by  mouth.   Cholecalciferol (VITAMIN D3) 50 MCG (2000 UT) TABS Take 2 tablets by mouth daily.   diclofenac Sodium (VOLTAREN) 1 % GEL Apply a small grape sized amount to right knee every 6 hours as needed.   diphenhydrAMINE (BENADRYL) 25 MG tablet Take 25 mg by mouth at bedtime as needed.   DULoxetine (CYMBALTA) 20 MG capsule TAKE 2 CAPSULES BY MOUTH DAILY   fluticasone (FLONASE) 50 MCG/ACT nasal spray Place 2 sprays into both nostrils every morning.   gabapentin (NEURONTIN) 100 MG capsule TAKE TWO CAPSULES BY MOUTH AT BEDTIME   Multiple Vitamin (MULTI-VITAMIN) tablet Take by mouth.   Multiple Vitamins-Minerals (PRESERVISION AREDS PO) Take by mouth.   omeprazole (PRILOSEC) 40 MG capsule TAKE 1 CAPSULE BY MOUTH DAILY   TART CHERRY PO Take 1 tablet by mouth daily.   ALPRAZolam (XANAX) 0.25 MG tablet Take one 1/2-hour before procedure. (Patient not taking: Reported on 04/03/2023)   glucosamine-chondroitin 500-400 MG tablet Take 1 tablet by mouth 3 (three) times daily. (Patient not taking: Reported on 04/03/2023)   phenazopyridine (PYRIDIUM) 200 MG tablet Take 1 tablet (200 mg total) by mouth 3 (three) times daily as needed for pain. (Patient not taking: Reported on 03/27/2023)   No facility-administered encounter medications on file as of 04/03/2023.    Allergies (verified) Lamisil [terbinafine] and Quinine   History: Past Medical History:  Diagnosis Date   Allergy    Anxiety 05/11/2016   Past Surgical History:  Procedure Laterality Date   EYE SURGERY  both cataracts   Family History  Problem Relation Age  of Onset   Hearing loss Mother    Cancer Father    Diabetes Father    Arthritis Maternal Aunt    Vision loss Maternal Aunt    Breast cancer Neg Hx    Social History   Socioeconomic History   Marital status: Married    Spouse name: Not on file   Number of children: 2   Years of education: Not on file   Highest education level: Not on file  Occupational History   Not on file   Tobacco Use   Smoking status: Never   Smokeless tobacco: Never  Vaping Use   Vaping status: Never Used  Substance and Sexual Activity   Alcohol use: Yes    Alcohol/week: 5.0 standard drinks of alcohol    Types: 5 Glasses of wine per week    Comment: usually with a meal   Drug use: Never   Sexual activity: Not Currently    Birth control/protection: Post-menopausal  Other Topics Concern   Not on file  Social History Narrative   Not on file   Social Drivers of Health   Financial Resource Strain: Low Risk  (04/03/2023)   Overall Financial Resource Strain (CARDIA)    Difficulty of Paying Living Expenses: Not hard at all  Food Insecurity: No Food Insecurity (04/03/2023)   Hunger Vital Sign    Worried About Running Out of Food in the Last Year: Never true    Ran Out of Food in the Last Year: Never true  Transportation Needs: No Transportation Needs (04/03/2023)   PRAPARE - Administrator, Civil Service (Medical): No    Lack of Transportation (Non-Medical): No  Physical Activity: Insufficiently Active (04/03/2023)   Exercise Vital Sign    Days of Exercise per Week: 4 days    Minutes of Exercise per Session: 30 min  Stress: No Stress Concern Present (04/03/2023)   [employer] of Occupational Health - Occupational Stress Questionnaire    Feeling of Stress : Not at all  Social Connections: Moderately Integrated (04/03/2023)   Social Connection and Isolation Panel [NHANES]    Frequency of Communication with Friends and Family: More than three times a week    Frequency of Social Gatherings with Friends and Family: Three times a week    Attends Religious Services: More than 4 times per year    Active Member of Clubs or Organizations: No    Attends Banker Meetings: Never    Marital Status: Married    Tobacco Counseling Counseling given: Not Answered   Clinical Intake:  Pre-visit preparation completed: Yes  Pain : No/denies pain     Nutritional  Risks: None Diabetes: No  How often do you need to have someone help you when you read instructions, pamphlets, or other written materials from your doctor or pharmacy?: 1 - Never  Interpreter Needed?: No  Information entered by :: NAllen LPN   Activities of Daily Living    04/03/2023    1:36 PM  In your present state of health, do you have any difficulty performing the following activities:  Hearing? 1  Comment hearing is diminishing  Vision? 0  Difficulty concentrating or making decisions? 0  Walking or climbing stairs? 1  Comment due to knees  Dressing or bathing? 0  Doing errands, shopping? 0  Preparing Food and eating ? N  Using the Toilet? N  In the past six months, have you accidently leaked urine? Y  Comment wears pad sometimes  Do  you have problems with loss of bowel control? N  Managing your Medications? N  Managing your Finances? N  Housekeeping or managing your Housekeeping? N    Patient Care Team: Mliss Sax, MD as PCP - General (Family Medicine) Marykay Lex, MD as PCP - Cardiology (Cardiology)  Indicate any recent Medical Services you may have received from other than [facility] providers in the past year (date may be approximate).     Assessment:   This is a routine wellness examination for Kameron.  Hearing/Vision screen Hearing Screening - Comments:: Has diminished hearing, but no hearing aids Vision Screening - Comments:: Regular eye exams, [facility]   Goals Addressed             This Visit's Progress    Patient Stated       04/03/2023, wants to lower cholesterol and lower sugar intake       Depression Screen    04/03/2023    1:51 PM 10/31/2022    9:00 AM 10/07/2022    9:45 AM 03/28/2022   10:03 AM 05/25/2021    4:01 PM 05/21/2021    1:21 PM 05/19/2021   10:37 AM  PHQ 2/9 Scores  PHQ - 2 Score 0 0 0 0 0 0 0  PHQ- 9 Score   0        Fall Risk    04/03/2023    1:51 PM 10/31/2022    9:00 AM 10/07/2022    9:45 AM 03/28/2022    10:03 AM 05/25/2021    4:01 PM  Fall Risk   Falls in the past year? 0 0 0 1 0  Comment    tripped   Number falls in past yr: 0 0 0 0 0  Injury with Fall? 0 0 0 0   Risk for fall due to : Medication side effect No Fall Risks No Fall Risks Medication side effect   Follow up Falls prevention discussed;Falls evaluation completed Falls prevention discussed Falls evaluation completed Falls prevention discussed;Falls evaluation completed;Education provided     MEDICARE RISK AT HOME: Medicare Risk at Home Any stairs in or around the home?: Yes If so, are there any without handrails?: Yes Home free of loose throw rugs in walkways, pet beds, electrical cords, etc?: Yes Adequate lighting in your home to reduce risk of falls?: Yes Life alert?: No Use of a cane, walker or w/c?: No Grab bars in the bathroom?: Yes Shower chair or bench in shower?: No Elevated toilet seat or a handicapped toilet?: No  TIMED UP AND GO:  Was the test performed?  No    Cognitive Function:        04/03/2023    1:52 PM 03/28/2022   10:06 AM  6CIT Screen  What Year? 0 points 0 points  What month? 0 points 0 points  What time? 0 points 0 points  Count back from 20 0 points 0 points  Months in reverse 0 points 0 points  Repeat phrase 2 points 0 points  Total Score 2 points 0 points    Immunizations Immunization History  Administered Date(s) Administered   Fluad Quad(high Dose 65+) 11/17/2021   Influenza Split 12/10/2015   Influenza,inj,Quad PF,6-35 Mos 11/27/2019   Influenza-Unspecified 12/10/2015, 11/23/2020   Moderna Covid-19 Fall Seasonal Vaccine 62yrs & older 12/15/2021   PFIZER(Purple Top)SARS-COV-2 Vaccination 05/20/2019, 06/10/2019, 12/22/2019, 06/21/2020, 11/30/2020   PNEUMOCOCCAL CONJUGATE-20 04/27/2021   RSV,unspecified 01/29/2022   Td 03/14/2014   Zoster Recombinant(Shingrix) 05/18/2020, 04/09/2021  TDAP status: Up to date  Flu Vaccine status: Up to date  Pneumococcal vaccine  status: Up to date  Covid-19 vaccine status: Information provided on how to obtain vaccines.   Qualifies for Shingles Vaccine? Yes   Zostavax completed Yes   Shingrix Completed?: Yes  Screening Tests Health Maintenance  Topic Date Due   DTaP/Tdap/Td (2 - Tdap) 03/14/2024   Medicare Annual Wellness (AWV)  04/02/2024   MAMMOGRAM  07/31/2024   Colonoscopy  10/18/2026   Pneumonia Vaccine 80+ Years old  Completed   INFLUENZA VACCINE  Completed   DEXA SCAN  Completed   Hepatitis C Screening  Completed   Zoster Vaccines- Shingrix  Completed   HPV VACCINES  Aged Out   COVID-19 Vaccine  Discontinued    Health Maintenance  There are no preventive care reminders to display for this patient.   Colorectal cancer screening: Type of screening: Colonoscopy. Completed 10/17/2016. Repeat every 10 years  Mammogram status: Completed 08/01/2022. Repeat every year  Bone Density status: Completed 01/12/2021.   Lung Cancer Screening: (Low Dose CT Chest recommended if Age 74-80 years, 20 pack-year currently smoking OR have quit w/in 15years.) does not qualify.   Lung Cancer Screening Referral: no  Additional Screening:  Hepatitis C Screening: does qualify; Completed 07/29/2016  Vision Screening: Recommended annual ophthalmology exams for early detection of glaucoma and other disorders of the eye. Is the patient up to date with their annual eye exam?  Yes  Who is the provider or what is the name of the office in which the patient attends annual eye exams? [facility] If pt is not established with a provider, would they like to be referred to a provider to establish care? No .   Dental Screening: Recommended annual dental exams for proper oral hygiene  Diabetic Foot Exam: n/a  Community Resource Referral / Chronic Care Management: CRR required this visit?  No   CCM required this visit?  No     Plan:     I have personally reviewed and noted the following in the patient's chart:    Medical and social history Use of alcohol, tobacco or illicit drugs  Current medications and supplements including opioid prescriptions. Patient is not currently taking opioid prescriptions. Functional ability and status Nutritional status Physical activity Advanced directives List of other physicians Hospitalizations, surgeries, and ER visits in previous 12 months Vitals Screenings to include cognitive, depression, and falls Referrals and appointments  In addition, I have reviewed and discussed with patient certain preventive protocols, quality metrics, and best practice recommendations. A written personalized care plan for preventive services as well as general preventive health recommendations were provided to patient.     Barb Merino, LPN   04/27/863   After Visit Summary: (MyChart) Due to this being a telephonic visit, the after visit summary with patients personalized plan was offered to patient via MyChart   Nurse Notes: none

## 2023-04-03 NOTE — Patient Instructions (Signed)
Ms. Anastasia , Thank you for taking time to come for your Medicare Wellness Visit. I appreciate your ongoing commitment to your health goals. Please review the following plan we discussed and let me know if I can assist you in the future.   Referrals/Orders/Follow-Ups/Clinician Recommendations: none  This is a list of the screening recommended for you and due dates:  Health Maintenance  Topic Date Due   DTaP/Tdap/Td vaccine (2 - Tdap) 03/14/2024   Medicare Annual Wellness Visit  04/02/2024   Mammogram  07/31/2024   Colon Cancer Screening  10/18/2026   Pneumonia Vaccine  Completed   Flu Shot  Completed   DEXA scan (bone density measurement)  Completed   Hepatitis C Screening  Completed   Zoster (Shingles) Vaccine  Completed   HPV Vaccine  Aged Out   COVID-19 Vaccine  Discontinued    Advanced directives: (Copy Requested) Please bring a copy of your health care power of attorney and living will to the office to be added to your chart at your convenience.  Next Medicare Annual Wellness Visit scheduled for next year: Yes  insert Preventive Care attachment Insert FALL PREVENTION attachment if needed

## 2023-04-16 ENCOUNTER — Ambulatory Visit
Admission: EM | Admit: 2023-04-16 | Discharge: 2023-04-16 | Disposition: A | Discharge status: Home / Self Care | Payer: PPO | Attending: Family Medicine | Admitting: Family Medicine

## 2023-04-16 DIAGNOSIS — R350 Frequency of micturition: Secondary | ICD-10-CM

## 2023-04-16 DIAGNOSIS — N3001 Acute cystitis with hematuria: Secondary | ICD-10-CM | POA: Diagnosis not present

## 2023-04-16 LAB — POCT URINALYSIS DIP (MANUAL ENTRY)
Glucose, UA: 250 mg/dL — AB
Nitrite, UA: POSITIVE — AB
Protein Ur, POC: 100 mg/dL — AB
Spec Grav, UA: <=1.005 — AB (ref 1.010–1.025)
Urobilinogen, UA: 4 U/dL — AB
pH, UA: 5 (ref 5.0–8.0)

## 2023-04-16 MED ORDER — CEPHALEXIN 500 MG PO CAPS: 500.0000 mg | ORAL_CAPSULE | Freq: Two times a day (BID) | ORAL | 0 refills | Status: DC

## 2023-04-16 NOTE — Discharge Instructions (Addendum)

## 2023-04-16 NOTE — ED Triage Notes (Signed)
Pt c/o dysuria and freq started last night-denies fever-NAD-steady gait

## 2023-04-16 NOTE — ED Provider Notes (Signed)
[facility]  Note:  This document was prepared using Conservation officer, historic buildings and may include unintentional dictation errors.  MRN: [id number] DOB: 1955/09/26  Subjective:   Monique Rangel is a 68 y.o. female presenting for 1 day history of acute onset dysuria, urinary frequency. Denies fever, n/v, abdominal pain, pelvic pain, rashes, hematuria, vaginal discharge.  Has a history of urinary tract infections.   No current facility-administered medications for this encounter.  Current Outpatient Medications:    albuterol (VENTOLIN HFA) 108 (90 Base) MCG/ACT inhaler, Inhale 1-2 puffs into the lungs every 6 (six) hours as needed for wheezing or shortness of breath., Disp: 8 g, Rfl: 0   ALPRAZolam (XANAX) 0.25 MG tablet, Take one 1/2-hour before procedure. (Patient not taking: Reported on 04/03/2023), Disp: 1 tablet, Rfl: 0   Biotin (BIOTIN MAXIMUM STRENGTH) 10 MG TABS, Take by mouth., Disp: , Rfl:    Cholecalciferol (VITAMIN D3) 50 MCG (2000 UT) TABS, Take 2 tablets by mouth daily., Disp: , Rfl:    diclofenac Sodium (VOLTAREN) 1 % GEL, Apply a small grape sized amount to right knee every 6 hours as needed., Disp: 150 g, Rfl: 1   diphenhydrAMINE (BENADRYL) 25 MG tablet, Take 25 mg by mouth at bedtime as needed., Disp: , Rfl:    DULoxetine (CYMBALTA) 20 MG capsule, TAKE 2 CAPSULES BY MOUTH DAILY, Disp: 180 capsule, Rfl: 1   fluticasone (FLONASE) 50 MCG/ACT nasal spray, Place 2 sprays into both nostrils every morning., Disp: , Rfl:    gabapentin (NEURONTIN) 100 MG capsule, TAKE TWO CAPSULES BY MOUTH AT BEDTIME, Disp: 180 capsule, Rfl: 0   glucosamine-chondroitin 500-400 MG tablet, Take 1 tablet by mouth 3 (three) times daily. (Patient not taking: Reported on 04/03/2023), Disp: , Rfl:    Multiple Vitamin (MULTI-VITAMIN) tablet, Take by mouth., Disp: , Rfl:    Multiple Vitamins-Minerals (PRESERVISION AREDS PO), Take by mouth., Disp: , Rfl:    omeprazole (PRILOSEC) 40  MG capsule, TAKE 1 CAPSULE BY MOUTH DAILY, Disp: 90 capsule, Rfl: 1   phenazopyridine (PYRIDIUM) 200 MG tablet, Take 1 tablet (200 mg total) by mouth 3 (three) times daily as needed for pain. (Patient not taking: Reported on 03/27/2023), Disp: 10 tablet, Rfl: 0   TART CHERRY PO, Take 1 tablet by mouth daily., Disp: , Rfl:    Allergies  Allergen Reactions   Lamisil [Terbinafine] Rash   Quinine Other (See Comments)    Mother had a bad reaction so does not want to use it Mother had a bad reaction so does not want to use it     Past Medical History:  Diagnosis Date   Allergy    Anxiety 05/11/2016     Past Surgical History:  Procedure Laterality Date   EYE SURGERY  both cataracts    Family History  Problem Relation Age of Onset   Hearing loss Mother    Cancer Father    Diabetes Father    Arthritis Maternal Aunt    Vision loss Maternal Aunt    Breast cancer Neg Hx     Social History   Tobacco Use   Smoking status: Never   Smokeless tobacco: Never  Vaping Use   Vaping status: Never Used  Substance Use Topics   Alcohol use: Yes    Alcohol/week: 5.0 standard drinks of alcohol    Types: 5 Glasses of wine per week    Comment: usually with a meal   Drug use: Never  ROS   Objective:   Vitals: BP 129/83 (BP Location: Right Arm)   Pulse 75   Temp 98 F (36.7 C)   Resp 20   SpO2 97%   Physical Exam Constitutional:      General: She is not in acute distress.    Appearance: Normal appearance. She is well-developed. She is not ill-appearing, toxic-appearing or diaphoretic.  HENT:     Head: Normocephalic and atraumatic.     Nose: Nose normal.     Mouth/Throat:     Mouth: Mucous membranes are moist.     Pharynx: Oropharynx is clear.  Eyes:     General: No scleral icterus.       Right eye: No discharge.        Left eye: No discharge.     Extraocular Movements: Extraocular movements intact.     Conjunctiva/sclera: Conjunctivae normal.  Cardiovascular:     Rate  and Rhythm: Normal rate.  Pulmonary:     Effort: Pulmonary effort is normal.  Abdominal:     General: Bowel sounds are normal. There is no distension.     Palpations: Abdomen is soft. There is no mass.     Tenderness: There is no abdominal tenderness. There is no right CVA tenderness, left CVA tenderness, guarding or rebound.  Skin:    General: Skin is warm and dry.  Neurological:     General: No focal deficit present.     Mental Status: She is alert and oriented to person, place, and time.  Psychiatric:        Mood and Affect: Mood normal.        Behavior: Behavior normal.        Thought Content: Thought content normal.        Judgment: Judgment normal.     Results for orders placed or performed during the hospital encounter of 04/16/23 (from the past 24 hours)  POCT urinalysis dipstick     Status: Abnormal   Collection Time: 04/16/23  8:58 AM  Result Value Ref Range   Color, UA orange (A) yellow   Clarity, UA cloudy (A) clear   Glucose, UA =250 (A) negative mg/dL   Bilirubin, UA small (A) negative   Ketones, POC UA trace (5) (A) negative mg/dL   Spec Grav, UA <=0.347 (A) 1.010 - 1.025   Blood, UA moderate (A) negative   pH, UA 5.0 5.0 - 8.0   Protein Ur, POC =100 (A) negative mg/dL   Urobilinogen, UA 4.0 (A) 0.2 or 1.0 E.U./dL   Nitrite, UA Positive (A) Negative   Leukocytes, UA Large (3+) (A) Negative    Assessment and Plan :   PDMP not reviewed this encounter.  1. Acute cystitis with hematuria   2. Urinary frequency    Will treat based off of the urinalysis with cephalexin.  Patient unable to provide enough urine for and urine culture.  Hydrate consistently.  Avoid urinary irritants.  Counseled patient on potential for adverse effects with medications prescribed/recommended today, ER and return-to-clinic precautions discussed, patient verbalized understanding.    Wallis Bamberg, [place] 04/16/23 1115

## 2023-04-16 NOTE — ED Notes (Signed)
Pt unable to void given water

## 2023-04-20 ENCOUNTER — Ambulatory Visit (INDEPENDENT_AMBULATORY_CARE_PROVIDER_SITE_OTHER): Payer: PPO | Admitting: Family Medicine

## 2023-04-20 ENCOUNTER — Encounter: Payer: Self-pay | Admitting: Family Medicine

## 2023-04-20 VITALS — BP 118/74 | HR 90 | Temp 97.6°F | Ht 66.0 in | Wt 199.2 lb

## 2023-04-20 DIAGNOSIS — R300 Dysuria: Secondary | ICD-10-CM | POA: Diagnosis not present

## 2023-04-20 LAB — URINALYSIS, ROUTINE W REFLEX MICROSCOPIC
Bilirubin Urine: NEGATIVE
Hgb urine dipstick: NEGATIVE
Ketones, ur: NEGATIVE
Leukocytes,Ua: NEGATIVE
Nitrite: NEGATIVE
Specific Gravity, Urine: 1.02 (ref 1.000–1.030)
Total Protein, Urine: NEGATIVE
Urine Glucose: NEGATIVE
Urobilinogen, UA: 0.2 (ref 0.0–1.0)
pH: 6 (ref 5.0–8.0)

## 2023-04-20 MED ORDER — NITROFURANTOIN MONOHYD MACRO 100 MG PO CAPS: 100.0000 mg | ORAL_CAPSULE | Freq: Two times a day (BID) | ORAL | 0 refills | Status: AC

## 2023-04-20 NOTE — Progress Notes (Signed)
 Established Patient Office Visit   Subjective:  Patient ID: Monique Rangel, female    DOB: December 10, 1955  Age: 68 y.o. MRN: [id number]  Chief Complaint  Patient presents with   Medication Reaction    Pt states keflex  caused severe abdominal pain and nausea. Pt had to stop taking it after day 2.     HPI Encounter Diagnoses  Name Primary?   Dysuria Yes   Here today for follow-up visit at urgent care 4 days ago.  She was experiencing dysuria and a urinalysis was grossly positive.  There was not enough urine for culture.  She has been taking AZO.  She was started on cephalexin  but developed nausea and vomiting after taking it.  Her symptoms have since cleared.  There is been no fevers chills nausea or vomiting, back or abdominal pain.   Review of Systems  Constitutional: Negative.  Negative for chills and fever.  HENT: Negative.    Eyes:  Negative for blurred vision, discharge and redness.  Respiratory: Negative.    Cardiovascular: Negative.   Gastrointestinal:  Negative for abdominal pain, nausea and vomiting.  Genitourinary: Negative.  Negative for dysuria, flank pain, frequency, hematuria and urgency.  Musculoskeletal: Negative.  Negative for back pain and myalgias.  Skin:  Negative for rash.  Neurological:  Negative for tingling, loss of consciousness and weakness.  Endo/Heme/Allergies:  Negative for polydipsia.     Current Outpatient Medications:    albuterol  (VENTOLIN  HFA) 108 (90 Base) MCG/ACT inhaler, Inhale 1-2 puffs into the lungs every 6 (six) hours as needed for wheezing or shortness of breath., Disp: 8 g, Rfl: 0   Biotin (BIOTIN MAXIMUM STRENGTH) 10 MG TABS, Take by mouth., Disp: , Rfl:    Cholecalciferol (VITAMIN D3) 50 MCG (2000 UT) TABS, Take 2 tablets by mouth daily., Disp: , Rfl:    diphenhydrAMINE (BENADRYL) 25 MG tablet, Take 25 mg by mouth at bedtime as needed., Disp: , Rfl:    DULoxetine  (CYMBALTA ) 20 MG capsule, TAKE 2 CAPSULES BY MOUTH DAILY, Disp: 180  capsule, Rfl: 1   fluticasone (FLONASE) 50 MCG/ACT nasal spray, Place 2 sprays into both nostrils every morning., Disp: , Rfl:    gabapentin  (NEURONTIN ) 100 MG capsule, TAKE TWO CAPSULES BY MOUTH AT BEDTIME, Disp: 180 capsule, Rfl: 0   Multiple Vitamin (MULTI-VITAMIN) tablet, Take by mouth., Disp: , Rfl:    Multiple Vitamins-Minerals (PRESERVISION AREDS PO), Take by mouth., Disp: , Rfl:    nitrofurantoin , macrocrystal-monohydrate, (MACROBID ) 100 MG capsule, Take 1 capsule (100 mg total) by mouth 2 (two) times daily for 6 days., Disp: 12 capsule, Rfl: 0   omeprazole  (PRILOSEC) 40 MG capsule, TAKE 1 CAPSULE BY MOUTH DAILY, Disp: 90 capsule, Rfl: 1   TART CHERRY PO, Take 1 tablet by mouth daily., Disp: , Rfl:    ALPRAZolam  (XANAX ) 0.25 MG tablet, Take one 1/2-hour before procedure. (Patient not taking: Reported on 04/20/2023), Disp: 1 tablet, Rfl: 0   cephALEXin  (KEFLEX ) 500 MG capsule, Take 1 capsule (500 mg total) by mouth 2 (two) times daily. (Patient not taking: Reported on 04/20/2023), Disp: 10 capsule, Rfl: 0   diclofenac  Sodium (VOLTAREN ) 1 % GEL, Apply a small grape sized amount to right knee every 6 hours as needed., Disp: 150 g, Rfl: 1   glucosamine-chondroitin 500-400 MG tablet, Take 1 tablet by mouth 3 (three) times daily. (Patient not taking: Reported on 04/03/2023), Disp: , Rfl:    phenazopyridine  (PYRIDIUM ) 200 MG tablet, Take 1 tablet (200 mg total) by  mouth 3 (three) times daily as needed for pain. (Patient not taking: Reported on 03/27/2023), Disp: 10 tablet, Rfl: 0   Objective:     BP 118/74   Pulse 90   Temp 97.6 F (36.4 C)   Ht 5' 6 (1.676 m)   Wt 199 lb 3.2 oz (90.4 kg)   SpO2 97%   BMI 32.15 kg/m    Physical Exam Constitutional:      General: She is not in acute distress.    Appearance: Normal appearance. She is not ill-appearing, toxic-appearing or diaphoretic.  HENT:     Head: Normocephalic and atraumatic.     Right Ear: External ear normal.     Left Ear: External  ear normal.  Eyes:     General: No scleral icterus.       Right eye: No discharge.        Left eye: No discharge.     Extraocular Movements: Extraocular movements intact.     Conjunctiva/sclera: Conjunctivae normal.  Cardiovascular:     Rate and Rhythm: Normal rate and regular rhythm.  Pulmonary:     Effort: Pulmonary effort is normal. No respiratory distress.     Breath sounds: No wheezing or rales.  Abdominal:     Tenderness: There is no right CVA tenderness or left CVA tenderness.  Skin:    General: Skin is warm and dry.  Neurological:     Mental Status: She is alert and oriented to person, place, and time.  Psychiatric:        Mood and Affect: Mood normal.        Behavior: Behavior normal.      No results found for any visits on 04/20/23.    The 10-year ASCVD risk score (Arnett DK, et al., 2019) is: 6.7%    Assessment & Plan:   Dysuria -     Urinalysis, Routine w reflex microscopic -     Urine Culture -     Nitrofurantoin  Monohyd Macro; Take 1 capsule (100 mg total) by mouth 2 (two) times daily for 6 days.  Dispense: 12 capsule; Refill: 0    Return if symptoms worsen or fail to improve.  6-day course of Macrobid .  Discussed the importance of adequate hydration to hopefully prevent UTIs.  Elsie Sim Lent, MD

## 2023-04-23 LAB — URINE CULTURE
MICRO NUMBER:: 16050863
SPECIMEN QUALITY:: ADEQUATE

## 2023-04-30 NOTE — Progress Notes (Signed)
 Office Visit Note  Patient: Monique Rangel             Date of Birth: 1956-01-16           MRN: [id number]             PCP: Mliss Sax, MD Referring: Judi Saa, DO Visit Date: 05/01/2023 Occupation: @GUAROCC @  Subjective:  New Patient (Initial Visit) (Patient states she is having problems with her right hand middle finger. 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 Patient states there is a pain that comes and goes alongside the right side of her neck that has been going on for around ten years now. )   Discussed the use of AI scribe software for clinical note transcription with the patient, who gave verbal consent to proceed.  History of Present Illness   Monique Rangel is a 68 year old female who presents with joint pain and swelling in the hand. She was referred by Dr. Katrinka Blazing for evaluation of abnormal lab tests and joint pain.  She has been experiencing joint pain and swelling in her hand, particularly affecting one finger, for the past three to four months. The sensation is described as 'catching', similar to a trigger finger, although it has been noted by another physician that it is not a trigger finger. The swelling has persisted despite cessation of activities like cross-stitching, which she initially thought might be contributing to the issue. No redness or heat in the affected area.  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 She suspects the current issue might be related to the recurrence of this tumor, although this is not confirmed. There has been no recent imaging of the hand, and she has not experienced similar swelling in other areas.  In her family history, her aunt has rheumatoid arthritis.  She has a history of degenerative disc disease, particularly around the C5-C6 vertebrae, which has caused intermittent back issues over the past ten years.  These episodes can be triggered by minor movements and have been managed with physical therapy, although symptoms often recur.  She recently completed a course of antibiotics for a urinary tract infection (UTI) about a week ago, which was associated with fatigue and unusual muscular pains in her back. She reports frequent UTIs.  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 She has a grandson whom she frequently holds and rocks, which she wonders might contribute to her symptoms.  No consistent numbness or radiating pain in the arm. She experiences occasional rashes that resolve with hydrocortisone cream. She has dry skin and occasional soreness in the back of the knee.    Labs reviewed 11/2022 ANA 1:160 nucleolar RF neg ESR 12  Activities of Daily Living:  Patient reports morning stiffness for less than 1 hour.   Patient Denies nocturnal pain.  Difficulty dressing/grooming: Denies Difficulty climbing stairs: Denies Difficulty getting out of chair: Denies Difficulty using hands for taps, buttons, cutlery, and/or writing: Reports  Review of Systems  Constitutional:  Negative for fatigue.  HENT:  Positive for mouth dryness. Negative for mouth sores.   Eyes:  Positive for dryness.  Respiratory:  Negative for shortness of breath.   Cardiovascular:  Negative for chest pain and palpitations.  Gastrointestinal:  Negative for blood in stool, constipation and diarrhea.  Endocrine: Negative for increased urination.  Genitourinary:  Negative for involuntary urination.  Musculoskeletal:  Positive for joint pain, gait problem, joint pain, joint swelling, myalgias, morning stiffness, muscle tenderness and myalgias. Negative for muscle weakness.  Skin:  Positive for sensitivity to sunlight. Negative for color change, rash and hair loss.  Allergic/Immunologic: Positive for susceptible to infections.  Neurological:  Negative for dizziness  and headaches.  Hematological:  Negative for swollen glands.  Psychiatric/Behavioral:  Positive for depressed mood and sleep disturbance. The patient is nervous/anxious.     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) 08/23/2022   Hyperlipidemia with target LDL less than 100 08/21/2022   Dilatation of thoracic aorta (HCC) 08/15/2022   Chest pain of uncertain etiology 07/18/2022   Fatigue 07/18/2022   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/20/2022   Herpes labialis 09/06/2021   SOB (shortness of breath) 09/06/2021   Slow transit constipation 05/25/2021   Gastroesophageal reflux disease 05/21/2021   Epigastric pain 05/21/2021   Gastroenteritis 05/21/2021   Lipoma of buttock 04/27/2021   Seborrheic keratosis 04/27/2021   Viral upper respiratory tract infection 02/10/2021   History of torn meniscus of left knee 11/26/2020   Primary osteoarthritis of left knee 11/26/2020   Chronic pain of both knees 05/12/2020   Onychomycosis 05/12/2020   GAD (generalized anxiety disorder) 04/14/2020   Asymptomatic postmenopausal estrogen deficiency 04/14/2020   Degenerative disc disease, cervical 04/14/2020   Migraine 04/14/2020   Macular degeneration, bilateral 04/14/2020   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   Sensorineural hearing loss (SNHL) of both ears 02/15/2017   Mild intermittent asthma without complication 03/14/2000    Past Medical History:  Diagnosis Date   Allergy    Anxiety 05/11/2016    Family  History  Problem Relation Age of Onset   Hearing loss Mother    Cancer Father    Diabetes Father    Arthritis Maternal Aunt    Vision loss Maternal Aunt    Breast cancer Neg Hx    Past Surgical History:  Procedure Laterality Date   EYE SURGERY  both cataracts   FINGER SURGERY Right    benign tumor on right middle finger   HERNIA REPAIR  1992   Social History   Social History Narrative   Not on file   Immunization History  Administered Date(s) Administered   Fluad Quad(high Dose 65+) 11/17/2021   Influenza Split 12/10/2015   Influenza,inj,Quad PF,6-35 Mos 11/27/2019   Influenza-Unspecified 12/10/2015, 11/23/2020   Moderna Covid-19 Fall Seasonal Vaccine 28yrs & older 12/15/2021   PFIZER(Purple Top)SARS-COV-2 Vaccination 05/20/2019, 06/10/2019, 12/22/2019, 06/21/2020, 11/30/2020   PNEUMOCOCCAL CONJUGATE-20 04/27/2021   RSV,unspecified 01/29/2022   Td 03/14/2014   Zoster Recombinant(Shingrix) 05/18/2020, 04/09/2021     Objective: Vital Signs: BP 115/76 (BP Location: Right Arm, Patient Position: Sitting, Cuff Size: Large)   Pulse 86   Resp 14   Ht 5' 6.5" (1.689 m)   Wt 200 lb (90.7 kg)   BMI 31.80 kg/m    Physical Exam Eyes:     Conjunctiva/sclera: Conjunctivae normal.  Cardiovascular:     Rate and Rhythm: Normal rate and regular rhythm.  Pulmonary:     Effort: Pulmonary effort is normal.     Breath sounds: Normal breath sounds.  Lymphadenopathy:     Cervical: No cervical adenopathy.  Skin:    General: Skin is warm and dry.     Comments: Very dry skin Normal-appearing nailfold capillaroscopy No digital pitting  Neurological:     Mental Status: She is alert.  Psychiatric:        Mood and Affect: Mood normal.      Musculoskeletal Exam:  Shoulders full ROM no tenderness or swelling Elbows full ROM no tenderness or swelling Wrists full ROM no tenderness or swelling Fingers full ROM, right middle finger joint stiffness and tenderness to pressure no  palpable effusion Knees full ROM, tenderness to pressure in the posterior of knee and discomfort with full extension Ankles full ROM no tenderness or swelling   Investigation: No additional findings.  Imaging: XR Hand 2 View Right Result Date: 05/01/2023 X-ray right hand 2 views Radiocarpal joint space appears normal.  There are mild degenerative changes at the first [facility] joint.  Multiple cystic changes present throughout the carpal bones and at first [facility] joint.  MCP joints are normal.  There is para-articular calcification present at the lateral and dorsal side of third PIP without any severe joint space loss or obvious soft tissue changes.  Degenerative changes at second PIP as well.  No erosions seen.  Bone mineralization appears normal. Impression Periarticular calcification at third PIP without other bony abnormality, mild to moderate osteoarthritis more at 1st [facility]   Recent Labs: Lab Results  Component Value Date   WBC 5.8 10/07/2022   HGB 13.6 10/07/2022   PLT 254.0 10/07/2022   NA 141 02/14/2023   K 4.0 02/14/2023   CL 103 02/14/2023   CO2 23 02/14/2023   GLUCOSE 150 (H) 02/14/2023   BUN 15 02/14/2023   CREATININE 0.64 02/14/2023   BILITOT 1.1 10/07/2022   ALKPHOS 66 10/07/2022   AST 18 10/07/2022   ALT 12 10/07/2022   PROT 6.7 10/07/2022   ALBUMIN 4.4 10/07/2022   CALCIUM 9.0 02/14/2023    Speciality Comments: No specialty comments available.  Procedures:  No procedures performed Allergies: Keflex [cephalexin], Lamisil [terbinafine], and Quinine   Assessment / Plan:     Visit Diagnoses: Positive ANA (antinuclear antibody) - Plan: RNP Antibody, Anti-Smith antibody, Sjogrens syndrome-A extractable nuclear antibody, Sjogrens syndrome-B extractable nuclear antibody, Anti-DNA antibody, double-stranded, C3 and C4, Sedimentation rate The patient has a positive ANA, which could indicate a range of conditions from lupus to Sjogren's syndrome to thyroid disease. The patient  has a family history of rheumatoid arthritis, but rheumatoid factor test was negative. -Order additional blood tests for more specific markers of autoimmune disease as detailed above.  Pain of finger of right hand - Plan: XR Hand 2 View Right Chronic swelling and pain in the right middle finger, previously diagnosed as a benign tumor. 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 The patient is scheduled to see Dr. Amanda Pea for further evaluation. Ultrasound showed possible bone spurring and calcification. -X-ray of hand obtained in clinic there are mild degenerative changes, there are some periarticular calcifications around the third PIP joint but otherwise nonspecific -Continue planned consultation with Dr. Amanda Pea.  Degenerative Disc Disease Chronic back pain due to degenerative disc disease, particularly around C5, C6. The patient  reports intermittent back pain for at least ten years, sometimes triggered by minor movements.  Recurrent Urinary Tract Infections The patient reports frequent UTIs, with the most recent one finishing treatment a week ago. The patient reported systemic symptoms during the last UTI, including fatigue and muscular back pain.   Orders: Orders Placed This Encounter  Procedures   XR Hand 2 View Right   RNP Antibody   Anti-Smith antibody   Sjogrens syndrome-A extractable nuclear antibody   Sjogrens syndrome-B extractable nuclear antibody   Anti-DNA antibody, double-stranded   C3 and C4   Sedimentation rate   No orders of the defined types were placed in this encounter.    Follow-Up Instructions: No follow-ups on file.   Fuller Plan, MD  Note - This record has been created using [employer].  Chart creation errors have been sought, but may not always  have been located. Such creation errors do not reflect on  the standard of medical care.

## 2023-05-01 ENCOUNTER — Ambulatory Visit: Payer: PPO

## 2023-05-01 ENCOUNTER — Ambulatory Visit: Payer: PPO | Attending: Internal Medicine | Admitting: Internal Medicine

## 2023-05-01 ENCOUNTER — Encounter: Payer: Self-pay | Admitting: Internal Medicine

## 2023-05-01 VITALS — BP 115/76 | HR 86 | Resp 14 | Ht 66.5 in | Wt 200.0 lb

## 2023-05-01 DIAGNOSIS — R768 Other specified abnormal immunological findings in serum: Secondary | ICD-10-CM

## 2023-05-01 DIAGNOSIS — M79644 Pain in right finger(s): Secondary | ICD-10-CM

## 2023-05-01 DIAGNOSIS — M542 Cervicalgia: Secondary | ICD-10-CM | POA: Diagnosis not present

## 2023-05-02 LAB — SJOGRENS SYNDROME-A EXTRACTABLE NUCLEAR ANTIBODY: SSA (Ro) (ENA) Antibody, IgG: <1 AI

## 2023-05-02 LAB — SEDIMENTATION RATE: Sed Rate: 6 mm/h (ref 0–30)

## 2023-05-02 LAB — C3 AND C4
C3 Complement: 153 mg/dL (ref 83–193)
C4 Complement: 24 mg/dL (ref 15–57)

## 2023-05-02 LAB — SJOGRENS SYNDROME-B EXTRACTABLE NUCLEAR ANTIBODY: SSB (La) (ENA) Antibody, IgG: <1 AI

## 2023-05-02 LAB — ANTI-SMITH ANTIBODY: ENA SM Ab Ser-aCnc: <1 AI

## 2023-05-02 LAB — ANTI-DNA ANTIBODY, DOUBLE-STRANDED: ds DNA Ab: 1 [IU]/mL

## 2023-05-02 LAB — RNP ANTIBODY: Ribonucleic Protein(ENA) Antibody, IgG: <1 AI

## 2023-05-11 ENCOUNTER — Other Ambulatory Visit: Payer: Self-pay | Admitting: Family Medicine

## 2023-05-22 DIAGNOSIS — M24541 Contracture, right hand: Secondary | ICD-10-CM | POA: Diagnosis not present

## 2023-05-22 DIAGNOSIS — M65331 Trigger finger, right middle finger: Secondary | ICD-10-CM | POA: Diagnosis not present

## 2023-05-23 ENCOUNTER — Other Ambulatory Visit: Payer: Self-pay | Admitting: Family Medicine

## 2023-05-23 DIAGNOSIS — F411 Generalized anxiety disorder: Secondary | ICD-10-CM

## 2023-05-29 ENCOUNTER — Telehealth: Payer: Self-pay | Admitting: Family Medicine

## 2023-05-29 NOTE — Telephone Encounter (Signed)
 Copied from CRM [id number]. Topic: Referral - Request for Referral >> May 29, 2023  3:20 PM Isabell A wrote: Did the patient discuss referral with their provider in the last year? No (If No - schedule appointment) (If Yes - send message)  Appointment offered? Yes, pt request to ask if she can get a referral without an appointment.   Type of order/referral and detailed reason for visit: ENT, tech could not see into ear drum stating her ears had to much ear wax in right ear for hearing aids.   Preference of office, provider, location: No specific office   If referral order, have you been seen by this specialty before? N/A (If Yes, this issue or another issue? When? Where?  Can we respond through MyChart? No

## 2023-06-08 ENCOUNTER — Ambulatory Visit (INDEPENDENT_AMBULATORY_CARE_PROVIDER_SITE_OTHER): Admitting: Family Medicine

## 2023-06-08 ENCOUNTER — Encounter: Payer: Self-pay | Admitting: Family Medicine

## 2023-06-08 VITALS — BP 126/82 | HR 79 | Temp 98.2°F | Ht 66.0 in | Wt 200.4 lb

## 2023-06-08 DIAGNOSIS — H903 Sensorineural hearing loss, bilateral: Secondary | ICD-10-CM | POA: Diagnosis not present

## 2023-06-08 DIAGNOSIS — E7800 Pure hypercholesterolemia, unspecified: Secondary | ICD-10-CM | POA: Diagnosis not present

## 2023-06-08 DIAGNOSIS — R7303 Prediabetes: Secondary | ICD-10-CM

## 2023-06-08 NOTE — Progress Notes (Signed)
 Established Patient Office Visit   Subjective:  Patient ID: Monique Rangel, female    DOB: Oct 29, 1955  Age: 68 y.o. MRN: [id number]  Chief Complaint  Patient presents with   Heart Problem    Pt complains of hearing loss in right ear along with wax build up. Pt requesting referral to ENT. Possible needing hearing aids.     Heart Problem Pertinent negatives include no abdominal pain, myalgias, rash or weakness.   Encounter Diagnoses  Name Primary?   Sensorineural hearing loss (SNHL) of both ears Yes   Elevated LDL cholesterol level    Prediabetes    Reports right greater than left hearing loss.  Was seen by audiologist and told there was occluding earwax in the right ear.   Review of Systems  Constitutional: Negative.   HENT:  Positive for hearing loss. Negative for ear discharge and ear pain.   Eyes:  Negative for blurred vision, discharge and redness.  Respiratory: Negative.    Cardiovascular: Negative.   Gastrointestinal:  Negative for abdominal pain.  Genitourinary: Negative.   Musculoskeletal: Negative.  Negative for myalgias.  Skin:  Negative for rash.  Neurological:  Negative for tingling, loss of consciousness and weakness.  Endo/Heme/Allergies:  Negative for polydipsia.     Current Outpatient Medications:    albuterol (VENTOLIN HFA) 108 (90 Base) MCG/ACT inhaler, Inhale 1-2 puffs into the lungs every 6 (six) hours as needed for wheezing or shortness of breath., Disp: 8 g, Rfl: 0   Biotin (BIOTIN MAXIMUM STRENGTH) 10 MG TABS, Take by mouth., Disp: , Rfl:    Cholecalciferol (VITAMIN D3) 50 MCG (2000 UT) TABS, Take 2 tablets by mouth daily., Disp: , Rfl:    diclofenac Sodium (VOLTAREN) 1 % GEL, Apply a small grape sized amount to right knee every 6 hours as needed., Disp: 150 g, Rfl: 1   diphenhydrAMINE (BENADRYL) 25 MG tablet, Take 25 mg by mouth at bedtime as needed., Disp: , Rfl:    DULoxetine (CYMBALTA) 20 MG capsule, TAKE 2 CAPSULES BY MOUTH DAILY, Disp: 180  capsule, Rfl: 1   fluticasone (FLONASE) 50 MCG/ACT nasal spray, Place 2 sprays into both nostrils every morning., Disp: , Rfl:    gabapentin (NEURONTIN) 100 MG capsule, TAKE TWO CAPSULES BY MOUTH AT BEDTIME, Disp: 180 capsule, Rfl: 0   Multiple Vitamin (MULTI-VITAMIN) tablet, Take by mouth., Disp: , Rfl:    Multiple Vitamins-Minerals (PRESERVISION AREDS PO), Take by mouth., Disp: , Rfl:    omeprazole (PRILOSEC) 40 MG capsule, TAKE 1 CAPSULE BY MOUTH DAILY, Disp: 90 capsule, Rfl: 1   TART CHERRY PO, Take 1 tablet by mouth daily., Disp: , Rfl:    Objective:     BP 126/82   Pulse 79   Temp 98.2 F (36.8 C)   Ht 5\' 6"  (1.676 m)   Wt 200 lb 6.4 oz (90.9 kg)   SpO2 97%   BMI 32.35 kg/m  Wt Readings from Last 3 Encounters:  06/08/23 200 lb 6.4 oz (90.9 kg)  05/01/23 200 lb (90.7 kg)  04/20/23 199 lb 3.2 oz (90.4 kg)      Physical Exam Constitutional:      General: She is not in acute distress.    Appearance: Normal appearance. She is not ill-appearing, toxic-appearing or diaphoretic.  HENT:     Head: Normocephalic and atraumatic.     Right Ear: Tympanic membrane, ear canal and external ear normal.     Left Ear: Tympanic membrane, ear canal and external ear  normal.     Mouth/Throat:     Mouth: Mucous membranes are moist.     Pharynx: Oropharynx is clear. No oropharyngeal exudate or posterior oropharyngeal erythema.  Eyes:     General: No scleral icterus.       Right eye: No discharge.        Left eye: No discharge.     Extraocular Movements: Extraocular movements intact.     Conjunctiva/sclera: Conjunctivae normal.     Pupils: Pupils are equal, round, and reactive to light.  Pulmonary:     Effort: Pulmonary effort is normal.  Musculoskeletal:     Cervical back: No rigidity or tenderness.  Skin:    General: Skin is warm and dry.  Neurological:     Mental Status: She is alert and oriented to person, place, and time.  Psychiatric:        Mood and Affect: Mood normal.         Behavior: Behavior normal.      No results found for any visits on 06/08/23.    The 10-year ASCVD risk score (Arnett DK, et al., 2019) is: 7.6%    Assessment & Plan:   Sensorineural hearing loss (SNHL) of both ears -     Ambulatory referral to ENT  Elevated LDL cholesterol level  Prediabetes    Return Has follow-up scheduled for physical in June..  Increasing physical activity for weight loss.  Plans to improve her diet.  She is aware of the Mediterranean diet for lowering the cholesterol.  She is starting gardening.  Mliss Sax, MD

## 2023-06-15 ENCOUNTER — Ambulatory Visit (INDEPENDENT_AMBULATORY_CARE_PROVIDER_SITE_OTHER): Admitting: Family Medicine

## 2023-06-15 ENCOUNTER — Encounter: Payer: Self-pay | Admitting: Family Medicine

## 2023-06-15 VITALS — BP 140/80 | HR 78 | Temp 98.6°F | Resp 18 | Wt 198.2 lb

## 2023-06-15 DIAGNOSIS — J069 Acute upper respiratory infection, unspecified: Secondary | ICD-10-CM | POA: Diagnosis not present

## 2023-06-15 DIAGNOSIS — J4521 Mild intermittent asthma with (acute) exacerbation: Secondary | ICD-10-CM | POA: Diagnosis not present

## 2023-06-15 DIAGNOSIS — J4520 Mild intermittent asthma, uncomplicated: Secondary | ICD-10-CM | POA: Diagnosis not present

## 2023-06-15 LAB — POCT INFLUENZA A/B

## 2023-06-15 LAB — POC COVID19 BINAXNOW: SARS Coronavirus 2 Ag: NEGATIVE

## 2023-06-15 MED ORDER — ALBUTEROL SULFATE HFA 108 (90 BASE) MCG/ACT IN AERS: 1.0000 | INHALATION_SPRAY | Freq: Four times a day (QID) | RESPIRATORY_TRACT | 0 refills | Status: AC | PRN

## 2023-06-15 MED ORDER — PREDNISONE 50 MG PO TABS: ORAL_TABLET | ORAL | 0 refills | Status: DC

## 2023-06-15 NOTE — Progress Notes (Signed)
 Assessment/Plan:    Assessment & Plan Viral-induced asthma exacerbation Presents with cold-like symptoms including sore throat, congestion, rhinorrhea, and cough since Sunday night, progressing to wheezing and dyspnea, particularly with exertion. Asthma is present; albuterol inhaler provides partial relief. Examination reveals prolonged expiration and wheezing, consistent with a viral-induced asthma exacerbation. Over-the-counter medications provide temporary relief. No fever or chills reported. Vital signs show marginally elevated blood pressure, likely due to illness and stress, but normal pulse and oxygen saturation. Considering her asthma and current symptoms, a course of prednisone is recommended to reduce pulmonary inflammation and improve breathing. She has tolerated steroids well in the past and is currently on omeprazole for gastric protection, with no contraindications noted. - Prescribe prednisone 50 mg daily for 5 days to reduce pulmonary inflammation. - Advise continuation of albuterol inhaler, 2 puffs every 4 hours as needed. - Refill albuterol inhaler prescription. - Advise to avoid contact with young grandchildren until symptoms improve, due to potential risk of respiratory viruses like RSV. - Instruct to seek emergency care if experiencing severe symptoms such as chest pain or unrelieved dyspnea. - Advise to take prednisone with food to prevent gastric upset.  Follow-up Monitor symptoms and contact the clinic if there is no improvement in a few days or if symptoms worsen. Given her concern about being around young grandchildren, it is advised to wait until symptoms improve to prevent potential transmission of respiratory viruses. - Provide a printout of the treatment plan and instructions.      There are no discontinued medications.  Return if symptoms worsen or fail to improve.    Subjective:   Encounter date: 06/15/2023  Monique Rangel is a 68 y.o. 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; Mild intermittent asthma without complication; Sensorineural hearing loss (SNHL) of both ears; GAD (generalized anxiety disorder); Asymptomatic postmenopausal estrogen deficiency; Degenerative disc disease, cervical; Migraine; Macular degeneration, bilateral; Chronic pain of both knees; Onychomycosis; History of torn meniscus of left knee; Primary osteoarthritis of left knee; Viral upper respiratory tract infection; Lipoma of buttock; Seborrheic keratosis; Gastroesophageal reflux disease; Epigastric pain; Gastroenteritis; Slow transit constipation; Herpes labialis; SOB (shortness of breath); 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   She  has a past medical history of Allergy and Anxiety (05/11/2016)..   She presents with chief complaint of cold like symptoms  (Pt C/O of cough, congestion, fatigue, runny nose for 4 days symptoms started Sunday with negative in home COVID testing. Pt took OTC medications for symptoms. ) .   Discussed the use of AI scribe software for clinical note transcription with the patient, who gave verbal consent to proceed.  History of Present Illness Monique Rangel is a 68 year old female with asthma who presents with cold-like symptoms and increased coughing.  She began experiencing cold-like symptoms on Sunday night, starting with a sore throat, followed by congestion and a runny nose on Monday. By Tuesday, congestion increased, and she developed a cough that worsened by  Tuesday night. The cough persisted and intensified on Wednesday, leading her to use her albuterol inhaler more frequently than usual. She  describes having a 'lot of junk' in her chest and occasional shortness of breath, particularly with movement. She also experienced a 'tiny bit of dizziness' today when getting up. She has been mostly bedridden and has been drinking plenty of fluids, including water and tea with Manuka honey. No fever or chills are reported.  Her current medications for symptom relief include albuterol inhaler, Flonase, Nasonex, Advil Cold and Sinus, and a NyQuil equivalent from [employer]. The over-the-counter medications help, but symptoms return as the medication wears off, approximately every four hours. No fever or chills, and she did not experience fever even when she had COVID-19 in 2020.  She has a history of asthma and has used steroids in the past without issues. She reports wheezing, particularly bad last night, which kept her awake. She is concerned about her blood pressure being elevated today, although it is usually on the low side of normal.  She usually cares for her 20-month-old grandson five days a week, who has a younger sibling almost 78 old.     ROS  Past Surgical History:  Procedure Laterality Date   EYE SURGERY  both cataracts   FINGER SURGERY Right    benign tumor on right middle finger   HERNIA REPAIR  1992    Outpatient Medications Prior to Visit  Medication Sig Dispense Refill   albuterol (VENTOLIN HFA) 108 (90 Base) MCG/ACT inhaler Inhale 1-2 puffs into the lungs every 6 (six) hours as needed for wheezing or shortness of breath. 8 g 0   Biotin (BIOTIN MAXIMUM STRENGTH) 10 MG TABS Take by mouth.     Cholecalciferol (VITAMIN D3) 50 MCG (2000 UT) TABS Take 2 tablets by mouth daily.     diclofenac Sodium (VOLTAREN) 1 % GEL Apply a small grape sized amount to right knee every 6 hours as needed. 150 g 1   diphenhydrAMINE (BENADRYL) 25 MG tablet  Take 25 mg by mouth at bedtime as needed.     DULoxetine (CYMBALTA) 20 MG capsule TAKE 2 CAPSULES BY MOUTH DAILY 180 capsule 1   fluticasone (FLONASE) 50 MCG/ACT nasal spray Place 2 sprays into both nostrils every morning.     gabapentin (NEURONTIN) 100 MG capsule TAKE TWO CAPSULES BY MOUTH AT BEDTIME 180 capsule 0   Multiple Vitamin (MULTI-VITAMIN) tablet Take by mouth.     Multiple Vitamins-Minerals (PRESERVISION AREDS PO) Take by mouth.     omeprazole (PRILOSEC) 40 MG capsule TAKE 1 CAPSULE BY MOUTH DAILY 90 capsule 1   Omeprazole Magnesium (PRILOSEC PO)      TART CHERRY PO Take 1 tablet by mouth daily.     valACYclovir (VALTREX) 1000 MG tablet Take 1,000 mg by mouth daily.     No facility-administered medications prior to visit.    Family History  Problem Relation Age of Onset   Hearing loss Mother    Cancer Father    Diabetes Father    Arthritis Maternal Aunt    Vision loss Maternal Aunt    Breast cancer Neg Hx     Social History   Socioeconomic History   Marital status: Married    Spouse name: Not on file   Number of children: 2   Years of education: Not on file   Highest education level: Master's degree (e.g., MA, MS, MEng, MEd, MSW, MBA)  Occupational History   Not on file  Tobacco Use   Smoking status: Never    Passive exposure: Past   Smokeless tobacco: Never  Vaping Use  Vaping status: Never Used  Substance and Sexual Activity   Alcohol use: Yes    Alcohol/week: 2.0 standard drinks of alcohol    Types: 2 Glasses of wine per week    Comment: usually with a meal   Drug use: Never   Sexual activity: Not Currently    Birth control/protection: Post-menopausal  Other Topics Concern   Not on file  Social History Narrative   Not on file   Social Drivers of Health   Financial Resource Strain: Low Risk  (04/18/2023)   Overall Financial Resource Strain (CARDIA)    Difficulty of Paying Living Expenses: Not hard at all  Food Insecurity: No Food Insecurity  (04/18/2023)   Hunger Vital Sign    Worried About Running Out of Food in the Last Year: Never true    Ran Out of Food in the Last Year: Never true  Transportation Needs: No Transportation Needs (04/18/2023)   PRAPARE - Administrator, Civil Service (Medical): No    Lack of Transportation (Non-Medical): No  Physical Activity: Insufficiently Active (04/18/2023)   Exercise Vital Sign    Days of Exercise per Week: 3 days    Minutes of Exercise per Session: 20 min  Stress: No Stress Concern Present (04/18/2023)   [employer] of Occupational Health - Occupational Stress Questionnaire    Feeling of Stress : Only a little  Social Connections: Socially Integrated (04/18/2023)   Social Connection and Isolation Panel [NHANES]    Frequency of Communication with Friends and Family: More than three times a week    Frequency of Social Gatherings with Friends and Family: Twice a week    Attends Religious Services: More than 4 times per year    Active Member of [employer] or Organizations: Yes    Attends Engineer, structural: More than 4 times per year    Marital Status: Married  Catering manager Violence: Not At Risk (04/03/2023)   Humiliation, Afraid, Rape, and Kick questionnaire    Fear of Current or Ex-Partner: No    Emotionally Abused: No    Physically Abused: No    Sexually Abused: No                                                                                                  Objective:  Physical Exam: BP (!) 140/80 (BP Location: Left Arm, Patient Position: Bed low/side rails up, Cuff Size: Large)   Pulse 78   Temp 98.6 F (37 C) (Oral)   Resp 18   Wt 198 lb 3.2 oz (89.9 kg)   SpO2 97%   BMI 31.99 kg/m    Physical Exam GENERAL: Alert, cooperative, well developed, no acute distress. HEENT: Normocephalic, normal oropharynx, moist mucous membranes. CHEST: Prolonged expiration with wheezing. CARDIOVASCULAR: Normal heart rate and rhythm, S1 and S2 normal without  murmurs. ABDOMEN: Soft, non-tender, non-distended, without organomegaly, normal bowel sounds. EXTREMITIES: No cyanosis or edema. NEUROLOGICAL: Cranial nerves grossly intact, moves all extremities without gross motor or sensory deficit.   Physical Exam  XR Hand 2 View Right Result Date: 05/01/2023 X-ray right hand  2 views Radiocarpal joint space appears normal.  There are mild degenerative changes at the first [facility] joint.  Multiple cystic changes present throughout the carpal bones and at first [facility] joint.  MCP joints are normal.  There is para-articular calcification present at the lateral and dorsal side of third PIP without any severe joint space loss or obvious soft tissue changes.  Degenerative changes at second PIP as well.  No erosions seen.  Bone mineralization appears normal. Impression Periarticular calcification at third PIP without other bony abnormality, mild to moderate osteoarthritis more at 1st [facility]   Recent Results (from the past 2160 hours)  POCT urinalysis dipstick     Status: Abnormal   Collection Time: 04/16/23  8:58 AM  Result Value Ref Range   Color, UA orange (A) yellow   Clarity, UA cloudy (A) clear   Glucose, UA =250 (A) negative mg/dL   Bilirubin, UA small (A) negative   Ketones, POC UA trace (5) (A) negative mg/dL   Spec Grav, UA <=1.478 (A) 1.010 - 1.025   Blood, UA moderate (A) negative   pH, UA 5.0 5.0 - 8.0   Protein Ur, POC =100 (A) negative mg/dL   Urobilinogen, UA 4.0 (A) 0.2 or 1.0 E.U./dL   Nitrite, UA Positive (A) Negative   Leukocytes, UA Large (3+) (A) Negative  Urinalysis, Routine w reflex microscopic     Status: Abnormal   Collection Time: 04/20/23  1:43 PM  Result Value Ref Range   Color, Urine YELLOW Yellow;Lt. Yellow;Straw;Dark Yellow;Amber;Green;Red;Brown   APPearance Sl Cloudy (A) Clear;Turbid;Slightly Cloudy;Cloudy   Specific Gravity, Urine 1.020 1.000 - 1.030   pH 6.0 5.0 - 8.0   Total Protein, Urine NEGATIVE Negative   Urine Glucose  NEGATIVE Negative   Ketones, ur NEGATIVE Negative   Bilirubin Urine NEGATIVE Negative   Hgb urine dipstick NEGATIVE Negative   Urobilinogen, UA 0.2 0.0 - 1.0   Leukocytes,Ua NEGATIVE Negative   Nitrite NEGATIVE Negative   WBC, UA 3-6/hpf (A) 0-2/hpf   RBC / HPF 0-2/hpf 0-2/hpf   Mucus, UA Presence of (A) None   Squamous Epithelial / HPF Rare(0-4/hpf) Rare(0-4/hpf)   Bacteria, UA Rare(<10/hpf) (A) None  Urine Culture     Status: Abnormal   Collection Time: 04/20/23  1:43 PM   Specimen: Urine  Result Value Ref Range   MICRO NUMBER: [id number]    SPECIMEN QUALITY: Adequate    Sample Source URINE    STATUS: FINAL    ISOLATE 1: Escherichia coli (A)     Comment: Greater than 100,000 CFU/mL of Escherichia coli      Susceptibility   Escherichia coli - URINE CULTURE, REFLEX    AMOX/CLAVULANIC <=2 Sensitive     AMPICILLIN <=2 Sensitive     AMPICILLIN/SULBACTAM <=2 Sensitive     CEFAZOLIN* <=4 Not Reportable      * For infections other than uncomplicated UTI caused by E. coli, K. pneumoniae or P. mirabilis: Cefazolin is resistant if MIC > or = 8 mcg/mL. (Distinguishing susceptible versus intermediate for isolates with MIC < or = 4 mcg/mL requires additional testing.) For uncomplicated UTI caused by E. coli, K. pneumoniae or P. mirabilis: Cefazolin is susceptible if MIC <32 mcg/mL and predicts susceptible to the oral agents cefaclor, cefdinir, cefpodoxime, cefprozil, cefuroxime, cephalexin and loracarbef.     CEFTAZIDIME <=1 Sensitive     CEFEPIME <=1 Sensitive     CEFTRIAXONE <=1 Sensitive     CIPROFLOXACIN <=0.25 Sensitive     LEVOFLOXACIN <=0.12 Sensitive  GENTAMICIN <=1 Sensitive     IMIPENEM <=0.25 Sensitive     NITROFURANTOIN <=16 Sensitive     PIP/TAZO <=4 Sensitive     TOBRAMYCIN <=1 Sensitive     TRIMETH/SULFA* <=20 Sensitive      * For infections other than uncomplicated UTI caused by E. coli, K. pneumoniae or P. mirabilis: Cefazolin is resistant if MIC > or = 8  mcg/mL. (Distinguishing susceptible versus intermediate for isolates with MIC < or = 4 mcg/mL requires additional testing.) For uncomplicated UTI caused by E. coli, K. pneumoniae or P. mirabilis: Cefazolin is susceptible if MIC <32 mcg/mL and predicts susceptible to the oral agents cefaclor, cefdinir, cefpodoxime, cefprozil, cefuroxime, cephalexin and loracarbef. Legend: S = Susceptible  I = Intermediate R = Resistant  NS = Not susceptible SDD = Susceptible Dose Dependent * = Not Tested  NR = Not Reported **NN = See Therapy Comments   RNP Antibody     Status: None   Collection Time: 05/01/23  2:22 PM  Result Value Ref Range   Ribonucleic Protein(ENA) Antibody, IgG <1.0 NEG <1.0 NEG AI  Anti-Smith antibody     Status: None   Collection Time: 05/01/23  2:22 PM  Result Value Ref Range   ENA SM Ab Ser-aCnc <1.0 NEG <1.0 NEG AI  Sjogrens syndrome-A extractable nuclear antibody     Status: None   Collection Time: 05/01/23  2:22 PM  Result Value Ref Range   SSA (Ro) (ENA) Antibody, IgG <1.0 NEG <1.0 NEG AI  Sjogrens syndrome-B extractable nuclear antibody     Status: None   Collection Time: 05/01/23  2:22 PM  Result Value Ref Range   SSB (La) (ENA) Antibody, IgG <1.0 NEG <1.0 NEG AI  Anti-DNA antibody, double-stranded     Status: None   Collection Time: 05/01/23  2:22 PM  Result Value Ref Range   ds DNA Ab 1 IU/mL    Comment:                            IU/mL       Interpretation                            < or = 4    Negative                            5-9         Indeterminate                            > or = 10   Positive .   C3 and C4     Status: None   Collection Time: 05/01/23  2:22 PM  Result Value Ref Range   C3 Complement 153 83 - 193 mg/dL   C4 Complement 24 15 - 57 mg/dL  Sedimentation rate     Status: None   Collection Time: 05/01/23  2:22 PM  Result Value Ref Range   Sed Rate 6 0 - 30 mm/h        Garner Nash, MD, MS

## 2023-06-15 NOTE — Patient Instructions (Signed)
 VISIT SUMMARY:  Today, you were seen for cold-like symptoms and increased coughing, which have worsened over the past few days. You have a history of asthma, and your symptoms are consistent with a viral-induced asthma exacerbation. Your vital signs were mostly normal, except for slightly elevated blood pressure, likely due to illness and stress. We discussed a treatment plan to help manage your symptoms and improve your breathing.  YOUR PLAN:  -VIRAL-INDUCED ASTHMA EXACERBATION: A viral-induced asthma exacerbation means that a viral infection has triggered your asthma symptoms to worsen. To help reduce the inflammation in your lungs and improve your breathing, you have been prescribed prednisone 50 mg daily for 5 days. Continue using your albuterol inhaler, 2 puffs every 4 hours as needed, and we have refilled your prescription for it. Avoid contact with your young grandchildren until your symptoms improve to prevent spreading any potential respiratory viruses. If you experience severe symptoms like chest pain or difficulty breathing that doesn't get better, seek emergency care immediately. Take the prednisone with food to avoid stomach upset.  INSTRUCTIONS:  Monitor your symptoms and contact the clinic if there is no improvement in a few days or if your symptoms worsen. Avoid contact with your young grandchildren until your symptoms improve to prevent potential transmission of respiratory viruses. A printout of your treatment plan and instructions has been provided.

## 2023-06-15 NOTE — Addendum Note (Signed)
 Addended by: Leroy Kennedy on: 06/15/2023 02:37 PM   Modules accepted: Orders

## 2023-07-21 ENCOUNTER — Telehealth: Admitting: Nurse Practitioner

## 2023-07-21 ENCOUNTER — Ambulatory Visit: Payer: Self-pay

## 2023-07-21 ENCOUNTER — Encounter: Payer: Self-pay | Admitting: Family Medicine

## 2023-07-21 DIAGNOSIS — S30861A Insect bite (nonvenomous) of abdominal wall, initial encounter: Secondary | ICD-10-CM | POA: Diagnosis not present

## 2023-07-21 DIAGNOSIS — W57XXXA Bitten or stung by nonvenomous insect and other nonvenomous arthropods, initial encounter: Secondary | ICD-10-CM | POA: Diagnosis not present

## 2023-07-21 MED ORDER — DOXYCYCLINE HYCLATE 100 MG PO TABS: 100.0000 mg | ORAL_TABLET | Freq: Two times a day (BID) | ORAL | 0 refills | Status: AC

## 2023-07-21 NOTE — Progress Notes (Signed)
 Virtual Visit Consent   Monique Rangel, you are scheduled for a virtual visit with a [facility] provider today. Just as with appointments in the office, your consent must be obtained to participate. Your consent will be active for this visit and any virtual visit you may have with one of our providers in the next 365 days. If you have a MyChart account, a copy of this consent can be sent to you electronically.  As this is a virtual visit, video technology does not allow for your provider to perform a traditional examination. This may limit your provider's ability to fully assess your condition. If your provider identifies any concerns that need to be evaluated in person or the need to arrange testing (such as labs, EKG, etc.), we will make arrangements to do so. Although advances in technology are sophisticated, we cannot ensure that it will always work on either your end or our end. If the connection with a video visit is poor, the visit may have to be switched to a telephone visit. With either a video or telephone visit, we are not always able to ensure that we have a secure connection.  By engaging in this virtual visit, you consent to the provision of healthcare and authorize for your insurance to be billed (if applicable) for the services provided during this visit. Depending on your insurance coverage, you may receive a charge related to this service.  I need to obtain your verbal consent now. Are you willing to proceed with your visit today? Monique Rangel has provided verbal consent on 07/21/2023 for a virtual visit (video or telephone). Mardene Shake, FNP  Date: 07/21/2023 1:43 PM   Virtual Visit via Video Note   I, Mardene Shake, connected with  Monique Rangel  ([id number], [birth year]) on 07/21/23 at  1:45 PM EDT by a video-enabled telemedicine application and verified that I am speaking with the correct person using two identifiers.  Location: Patient: Virtual Visit Location Patient:  Home Provider: Virtual Visit Location Provider: Home Office   I discussed the limitations of evaluation and management by telemedicine and the availability of in person appointments. The patient expressed understanding and agreed to proceed.    History of Present Illness: Monique Rangel is a 68 y.o. who identifies as a female who was assigned female at birth, and is being seen today for a tick bite  She was bit on 07/16/23 left hip  Was able to remove but it was "engorged" Left a red "mosquito like" mark  She is a gardener and checks herself regularly  She is fairly positive it was attached for less than 24 hours   Image uploaded into chart in patient messages   Last night she started to experience a headache  This is different than her migraines  Headache is worse when she moves her head - but she says that it is different from a sinus headache  She also has fatigue as well   No fever   She did take Advil last night and that helped her headache  Denies any other associated symptoms   Appetite is good   She has an appointment with primary care 07/24/23   Problems:  Patient Active Problem List   Diagnosis Date Noted   Positive ANA (antinuclear antibody) 05/01/2023   Neck pain 05/01/2023   Dysuria 04/20/2023   Prediabetes 03/27/2023   Pain of finger of right hand 03/27/2023   Healthcare maintenance 10/07/2022   Elevated LDL cholesterol level  10/07/2022   Screening for diabetes mellitus 10/07/2022   Dental anomaly 10/07/2022   UTI symptoms 09/22/2022   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) 08/23/2022   Hyperlipidemia with target LDL less than 100 08/21/2022   Dilatation of thoracic aorta (HCC) 08/15/2022   Chest pain of uncertain etiology 07/18/2022   Fatigue 07/18/2022   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/20/2022   Herpes labialis 09/06/2021   SOB (shortness of breath) 09/06/2021   Slow transit constipation  05/25/2021   Gastroesophageal reflux disease 05/21/2021   Epigastric pain 05/21/2021   Gastroenteritis 05/21/2021   Lipoma of buttock 04/27/2021   Seborrheic keratosis 04/27/2021   Viral upper respiratory tract infection 02/10/2021   History of torn meniscus of left knee 11/26/2020   Primary osteoarthritis of left knee 11/26/2020   Chronic pain of both knees 05/12/2020   Onychomycosis 05/12/2020   GAD (generalized anxiety disorder) 04/14/2020   Asymptomatic postmenopausal estrogen deficiency 04/14/2020   Degenerative disc disease, cervical 04/14/2020   Migraine 04/14/2020   Macular degeneration, bilateral 04/14/2020   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   Sensorineural hearing loss (SNHL) of both ears 02/15/2017   Mild intermittent asthma without complication 03/14/2000    Allergies:  Allergies  Allergen Reactions   Keflex  [Cephalexin ] Other (See Comments)    Abdominal pain and Nausea   Lamisil  [Terbinafine ] Rash   Quinine Other (See Comments)    Mother had a bad reaction so does not want to use it Mother had a bad reaction so does not want to use it    Medications:  Current Outpatient Medications:    albuterol  (VENTOLIN  HFA) 108 (90 Base) MCG/ACT inhaler, Inhale 1-2 puffs into the lungs every 6 (six) hours as needed for wheezing or shortness of breath., Disp: 8 g, Rfl: 0   Biotin (BIOTIN MAXIMUM STRENGTH) 10 MG TABS, Take by mouth., Disp: , Rfl:    Cholecalciferol (VITAMIN D3) 50 MCG (2000 UT) TABS, Take 2 tablets by mouth daily., Disp: , Rfl:    diclofenac  Sodium (VOLTAREN ) 1 % GEL, Apply a small grape sized amount to right knee every 6 hours as needed., Disp: 150 g, Rfl: 1   diphenhydrAMINE (BENADRYL) 25 MG tablet, Take 25 mg by mouth at bedtime as needed., Disp: , Rfl:    DULoxetine  (CYMBALTA ) 20 MG capsule, TAKE 2 CAPSULES BY MOUTH DAILY, Disp: 180 capsule, Rfl: 1   fluticasone (FLONASE) 50 MCG/ACT nasal spray, Place 2 sprays into both nostrils  every morning., Disp: , Rfl:    gabapentin  (NEURONTIN ) 100 MG capsule, TAKE TWO CAPSULES BY MOUTH AT BEDTIME, Disp: 180 capsule, Rfl: 0   Multiple Vitamin (MULTI-VITAMIN) tablet, Take by mouth., Disp: , Rfl:    Multiple Vitamins-Minerals (PRESERVISION AREDS PO), Take by mouth., Disp: , Rfl:    omeprazole  (PRILOSEC) 40 MG capsule, TAKE 1 CAPSULE BY MOUTH DAILY, Disp: 90 capsule, Rfl: 1   Omeprazole  Magnesium (PRILOSEC PO), , Disp: , Rfl:    predniSONE  (DELTASONE ) 50 MG tablet, Take 1 tablet daily for 5 days., Disp: 5 tablet, Rfl: 0   TART CHERRY PO, Take 1 tablet by mouth daily., Disp: , Rfl:    valACYclovir  (VALTREX ) 1000 MG tablet, Take 1,000 mg by mouth daily., Disp: , Rfl:   Observations/Objective: Patient is well-developed, well-nourished in no acute distress.  Resting comfortably  at home.  Head is normocephalic, atraumatic.  No labored breathing.  Speech is clear and coherent with logical  content.  Patient is alert and oriented at baseline.  See image in chart pinpoint erythematous bite mark to hip no rash/ surrounding skin WNL   Assessment and Plan:  1. Tick bite of abdominal wall, initial encounter (Primary)  - doxycycline (VIBRA-TABS) 100 MG tablet; Take 1 tablet (100 mg total) by mouth 2 (two) times daily for 14 days.  Dispense: 28 tablet; Refill: 0     Follow Up Instructions: I discussed the assessment and treatment plan with the patient. The patient was provided an opportunity to ask questions and all were answered. The patient agreed with the plan and demonstrated an understanding of the instructions.  A copy of instructions were sent to the patient via MyChart unless otherwise noted below.     The patient was advised to call back or seek an in-person evaluation if the symptoms worsen or if the condition fails to improve as anticipated.    Mardene Shake, FNP

## 2023-07-21 NOTE — Telephone Encounter (Signed)
  Chief Complaint: headache, fatigue tick bite to left hip Symptoms: redness size of watermelon see and scab in middle  Frequency: found on Sunday Pertinent Negatives: Patient denies fever, joing pain Disposition: [] ED /[] Urgent Care (no appt availability in office) / [] Appointment(In office/virtual)/ [x]  [facility]/ [] Home Care/ [] Refused Recommended Disposition /[] [facility]/ []  Follow-up with PCP Additional Notes: called CAL no openings Copied from CRM #[id number]. Topic: Clinical - Red Word Triage >> Jul 21, 2023 10:09 AM Alyse July wrote: Red Word that prompted transfer to Nurse Triage: tick bite( unusual headache and low energy) Reason for Disposition  [1] 2 to 14 days following tick bite AND [2] widespread rash or headache AND [3] no fever  Answer Assessment - Initial Assessment Questions 1. ATTACHED:  "Is the tick still on the skin?"  (e.g., yes, no, unsure)     no 2. ONSET - TICK STILL ATTACHED:  "How long do you think the tick has been on your skin?" (e.g., hours, days, unsure)  Note:  Is there a recent activity (camping, hiking) where the caller may have been exposed?     Came off easily 3. ONSET - TICK NOT STILL ATTACHED: "If the tick has been removed, how long do you think the tick was attached before you removed it?" (e.g., 5 hours, 2 days). "When was this?"     hours 4. LOCATION: "Where is the tick bite located?" (e.g., arm, leg)     Left hip 5. TYPE of TICK: "Is it a wood tick or a deer tick?" (e.g., deer tick, wood tick; unsure)     Watermelon seed tick  6. SIZE of TICK: "How big is the tick?" (e.g., size of poppy seed, apple seed, watermelon seed; unsure) Note: Deer ticks can be the size of a poppy seed (nymph) or an apple seed (adult).       watermelon 7. ENGORGED: "Did the tick look flat or engorged (full, swollen)?" (e.g., flat, engorged; unsure)     Slightly engorged 8. OTHER SYMPTOMS: "Do you have any other symptoms?" (e.g., fever, rash, redness  at bite area, red ring around bite)     Round and looks like a scab, redness at bite area watermelon seed darker in middle then fades  Protocols used: Tick Bite-A-AH

## 2023-07-24 ENCOUNTER — Encounter: Payer: Self-pay | Admitting: Family Medicine

## 2023-07-24 ENCOUNTER — Ambulatory Visit: Admitting: Family Medicine

## 2023-07-24 VITALS — BP 122/76 | HR 83 | Temp 98.0°F | Ht 66.0 in | Wt 201.2 lb

## 2023-07-24 DIAGNOSIS — S70262A Insect bite (nonvenomous), left hip, initial encounter: Secondary | ICD-10-CM | POA: Diagnosis not present

## 2023-07-24 DIAGNOSIS — W57XXXA Bitten or stung by nonvenomous insect and other nonvenomous arthropods, initial encounter: Secondary | ICD-10-CM

## 2023-07-24 NOTE — Progress Notes (Signed)
 Established Patient Office Visit   Subjective:  Patient ID: Monique Rangel, female    DOB: [birth year]  Age: 68 y.o. MRN: [id number]  Chief Complaint  Patient presents with   Insect Bite    Pt was bitten by a tick 1 week ago. Pt now complains of headache and fatigue. Left side of hip. Pt states he headaches are constant and change regions of her head.     HPI Encounter Diagnoses  Name Primary?   Tick bite of left hip, initial encounter Yes   Reports ongoing signs and symptoms after removing a tick from her left hip area at the back going 8 days ago.  She tells of migratory headaches about her head that last momentarily before moving.  There is ongoing fatigue.  She denies fevers or chills.  The area of the bite is healing well.  There is no rash on her hands or feet.  Was seen by televisit and given a 7-day course of doxycycline.   Review of Systems  Constitutional:  Positive for malaise/fatigue. Negative for chills and fever.  HENT: Negative.    Eyes:  Negative for blurred vision, discharge and redness.  Respiratory: Negative.    Cardiovascular: Negative.   Gastrointestinal:  Negative for abdominal pain.  Genitourinary: Negative.   Musculoskeletal: Negative.  Negative for myalgias.  Skin:  Negative for rash.  Neurological:  Positive for headaches. Negative for tingling, loss of consciousness and weakness.  Endo/Heme/Allergies:  Negative for polydipsia.     Current Outpatient Medications:    albuterol  (VENTOLIN  HFA) 108 (90 Base) MCG/ACT inhaler, Inhale 1-2 puffs into the lungs every 6 (six) hours as needed for wheezing or shortness of breath., Disp: 8 g, Rfl: 0   Biotin (BIOTIN MAXIMUM STRENGTH) 10 MG TABS, Take by mouth., Disp: , Rfl:    Cholecalciferol (VITAMIN D3) 50 MCG (2000 UT) TABS, Take 2 tablets by mouth daily., Disp: , Rfl:    diclofenac  Sodium (VOLTAREN ) 1 % GEL, Apply a small grape sized amount to right knee every 6 hours as needed., Disp: 150 g, Rfl: 1    diphenhydrAMINE (BENADRYL) 25 MG tablet, Take 25 mg by mouth at bedtime as needed., Disp: , Rfl:    doxycycline (VIBRA-TABS) 100 MG tablet, Take 1 tablet (100 mg total) by mouth 2 (two) times daily for 14 days., Disp: 28 tablet, Rfl: 0   DULoxetine  (CYMBALTA ) 20 MG capsule, TAKE 2 CAPSULES BY MOUTH DAILY, Disp: 180 capsule, Rfl: 1   fluticasone (FLONASE) 50 MCG/ACT nasal spray, Place 2 sprays into both nostrils every morning., Disp: , Rfl:    gabapentin  (NEURONTIN ) 100 MG capsule, TAKE TWO CAPSULES BY MOUTH AT BEDTIME, Disp: 180 capsule, Rfl: 0   Multiple Vitamin (MULTI-VITAMIN) tablet, Take by mouth., Disp: , Rfl:    Multiple Vitamins-Minerals (PRESERVISION AREDS PO), Take by mouth., Disp: , Rfl:    omeprazole  (PRILOSEC) 40 MG capsule, TAKE 1 CAPSULE BY MOUTH DAILY, Disp: 90 capsule, Rfl: 1   Omeprazole  Magnesium (PRILOSEC PO), , Disp: , Rfl:    TART CHERRY PO, Take 1 tablet by mouth daily., Disp: , Rfl:    predniSONE  (DELTASONE ) 50 MG tablet, Take 1 tablet daily for 5 days., Disp: 5 tablet, Rfl: 0   valACYclovir  (VALTREX ) 1000 MG tablet, Take 1,000 mg by mouth daily., Disp: , Rfl:    Objective:     BP 122/76 (BP Location: Right Arm, Patient Position: Sitting, Cuff Size: Large)   Pulse 83   Temp 98 F (36.7  C) (Temporal)   Ht 5\' 6"  (1.676 m)   Wt 201 lb 3.2 oz (91.3 kg)   SpO2 96%   BMI 32.47 kg/m    Physical Exam Constitutional:      General: She is not in acute distress.    Appearance: Normal appearance. She is not ill-appearing, toxic-appearing or diaphoretic.  HENT:     Head: Normocephalic and atraumatic.     Right Ear: External ear normal.     Left Ear: External ear normal.     Mouth/Throat:     Mouth: Mucous membranes are moist.     Pharynx: Oropharynx is clear. No oropharyngeal exudate or posterior oropharyngeal erythema.  Eyes:     General: No scleral icterus.       Right eye: No discharge.        Left eye: No discharge.     Extraocular Movements: Extraocular  movements intact.     Conjunctiva/sclera: Conjunctivae normal.     Pupils: Pupils are equal, round, and reactive to light.  Cardiovascular:     Rate and Rhythm: Normal rate and regular rhythm.  Pulmonary:     Effort: Pulmonary effort is normal. No respiratory distress.     Breath sounds: Normal breath sounds.  Musculoskeletal:     Cervical back: No rigidity or tenderness.  Skin:    General: Skin is warm and dry.     Findings: No rash.       Neurological:     Mental Status: She is alert and oriented to person, place, and time.     Cranial Nerves: No cranial nerve deficit, dysarthria or facial asymmetry.  Psychiatric:        Mood and Affect: Mood normal.        Behavior: Behavior normal.      No results found for any visits on 07/24/23.    The 10-year ASCVD risk score (Arnett DK, et al., 2019) is: 7.2%    Assessment & Plan:   Tick bite of left hip, initial encounter -     Spotted Fever Group Antibodies -     Lyme Disease Serology w/Reflex    Return if symptoms worsen or fail to improve.  Complete course of doxycycline.  Tonna Frederic, MD

## 2023-07-26 LAB — LYME DISEASE SEROLOGY W/REFLEX: Lyme Total Antibody EIA: NEGATIVE

## 2023-07-26 LAB — SPOTTED FEVER GROUP ANTIBODIES

## 2023-07-27 ENCOUNTER — Ambulatory Visit: Payer: Self-pay | Admitting: Family Medicine

## 2023-08-06 ENCOUNTER — Other Ambulatory Visit: Payer: Self-pay | Admitting: Family Medicine

## 2023-08-24 ENCOUNTER — Ambulatory Visit (INDEPENDENT_AMBULATORY_CARE_PROVIDER_SITE_OTHER): Admitting: Audiology

## 2023-08-24 ENCOUNTER — Institutional Professional Consult (permissible substitution) (INDEPENDENT_AMBULATORY_CARE_PROVIDER_SITE_OTHER): Admitting: Otolaryngology

## 2023-08-24 NOTE — Progress Notes (Deleted)
 Dear Dr. Tilmon Font, Here is my assessment for our mutual patient, Monique Rangel. Thank you for allowing me the opportunity to care for your patient. Please do not hesitate to contact me should you have any other questions. Sincerely, Dr. Milon Aloe  Otolaryngology Clinic Note Referring provider: Dr. Tilmon Font HPI:  Monique Rangel is a 68 y.o. female kindly referred by Dr. Tilmon Font for evaluation of ***.   H&N Surgery: *** Personal or FHx of bleeding dz or anesthesia difficulty: no ***  GLP-1: *** AP/AC: ***  Tobacco: ***. Alcohol: ***. Occupation: ***. Lives in *** with ***.  PMHx: Degen Disc Disease  Independent Review of Additional Tests or Records:  Dr. Tilmon Font (06/08/2023): noted right ear hearing loss, possible cerumen impaction; Dx: HL; Rx: f/u with ENT Dr. Melissa Spring (02/15/2017): SNHL both ears Labs reviewed: CBC and BMP (09/2022 and 02/2023): BUN/Cr 15/0.64; WBC 5.8, Hgb 13.6,  PMH/Meds/All/SocHx/FamHx/ROS:   Past Medical History:  Diagnosis Date   Allergy    Anxiety 05/11/2016     Past Surgical History:  Procedure Laterality Date   EYE SURGERY  both cataracts   FINGER SURGERY Right    benign tumor on right middle finger   HERNIA REPAIR  1992    Family History  Problem Relation Age of Onset   Hearing loss Mother    Cancer Father    Diabetes Father    Arthritis Maternal Aunt    Vision loss Maternal Aunt    Breast cancer Neg Hx      Social Connections: Socially Integrated (04/18/2023)   Social Connection and Isolation Panel    Frequency of Communication with Friends and Family: More than three times a week    Frequency of Social Gatherings with Friends and Family: Twice a week    Attends Religious Services: More than 4 times per year    Active Member of [employer] or Organizations: Yes    Attends Engineer, structural: More than 4 times per year    Marital Status: Married      Current Outpatient Medications:    albuterol  (VENTOLIN  HFA) 108 (90 Base) MCG/ACT  inhaler, Inhale 1-2 puffs into the lungs every 6 (six) hours as needed for wheezing or shortness of breath., Disp: 8 g, Rfl: 0   Biotin (BIOTIN MAXIMUM STRENGTH) 10 MG TABS, Take by mouth., Disp: , Rfl:    Cholecalciferol (VITAMIN D3) 50 MCG (2000 UT) TABS, Take 2 tablets by mouth daily., Disp: , Rfl:    diclofenac  Sodium (VOLTAREN ) 1 % GEL, Apply a small grape sized amount to right knee every 6 hours as needed., Disp: 150 g, Rfl: 1   diphenhydrAMINE (BENADRYL) 25 MG tablet, Take 25 mg by mouth at bedtime as needed., Disp: , Rfl:    DULoxetine  (CYMBALTA ) 20 MG capsule, TAKE 2 CAPSULES BY MOUTH DAILY, Disp: 180 capsule, Rfl: 1   fluticasone (FLONASE) 50 MCG/ACT nasal spray, Place 2 sprays into both nostrils every morning., Disp: , Rfl:    gabapentin  (NEURONTIN ) 100 MG capsule, TAKE 2 CAPSULES BY MOUTH AT BEDTIME, Disp: 180 capsule, Rfl: 0   Multiple Vitamin (MULTI-VITAMIN) tablet, Take by mouth., Disp: , Rfl:    Multiple Vitamins-Minerals (PRESERVISION AREDS PO), Take by mouth., Disp: , Rfl:    omeprazole  (PRILOSEC) 40 MG capsule, TAKE 1 CAPSULE BY MOUTH DAILY, Disp: 90 capsule, Rfl: 1   Omeprazole  Magnesium (PRILOSEC PO), , Disp: , Rfl:    TART CHERRY PO, Take 1 tablet by mouth daily., Disp: , Rfl:    Physical  Exam:   There were no vitals taken for this visit.  Salient findings:  CN II-XII intact *** Bilateral EAC clear and TM intact with well pneumatized middle ear spaces Weber 512: *** Rinne 512: AC > BC b/l *** Rine 1024: AC > BC b/l *** Anterior rhinoscopy: Septum ***; bilateral inferior turbinates with *** No lesions of oral cavity/oropharynx; dentition *** No obviously palpable neck masses/lymphadenopathy/thyromegaly No respiratory distress or stridor***  Seprately Identifiable Procedures:  Prior to initiating any procedures, risks/benefits/alternatives were explained to the patient and verbal consent obtained. None***  Impression & Plans:  Monique Rangel is a 68 y.o. female with  ***  No diagnosis found.   - f/u ***  See below regarding exact medications prescribed this encounter including dosages and route: No orders of the defined types were placed in this encounter.     Thank you for allowing me the opportunity to care for your patient. Please do not hesitate to contact me should you have any other questions.  Sincerely, Milon Aloe, MD Otolaryngologist (ENT), [facility] Phone: [phone] Fax: [phone]  08/24/2023, 8:02 AM   MDM:  Level *** Complexity/Problems addressed: *** Data complexity: *** independent review of *** - Morbidity: ***  - Prescription Drug prescribed or managed: ***

## 2023-09-30 ENCOUNTER — Other Ambulatory Visit: Payer: Self-pay | Admitting: Family Medicine

## 2023-09-30 DIAGNOSIS — K219 Gastro-esophageal reflux disease without esophagitis: Secondary | ICD-10-CM

## 2023-10-10 ENCOUNTER — Encounter: Payer: Self-pay | Admitting: Family Medicine

## 2023-10-10 ENCOUNTER — Ambulatory Visit (INDEPENDENT_AMBULATORY_CARE_PROVIDER_SITE_OTHER): Payer: PPO | Admitting: Family Medicine

## 2023-10-10 VITALS — BP 110/74 | HR 74 | Temp 97.6°F | Ht 66.0 in | Wt 198.6 lb

## 2023-10-10 DIAGNOSIS — Z0000 Encounter for general adult medical examination without abnormal findings: Secondary | ICD-10-CM

## 2023-10-10 DIAGNOSIS — E559 Vitamin D deficiency, unspecified: Secondary | ICD-10-CM

## 2023-10-10 DIAGNOSIS — K219 Gastro-esophageal reflux disease without esophagitis: Secondary | ICD-10-CM

## 2023-10-10 DIAGNOSIS — F411 Generalized anxiety disorder: Secondary | ICD-10-CM | POA: Diagnosis not present

## 2023-10-10 DIAGNOSIS — E538 Deficiency of other specified B group vitamins: Secondary | ICD-10-CM | POA: Diagnosis not present

## 2023-10-10 DIAGNOSIS — E7800 Pure hypercholesterolemia, unspecified: Secondary | ICD-10-CM

## 2023-10-10 DIAGNOSIS — R7303 Prediabetes: Secondary | ICD-10-CM | POA: Diagnosis not present

## 2023-10-10 LAB — CBC WITH DIFFERENTIAL/PLATELET
Basophils Absolute: 0.1 K/uL (ref 0.0–0.1)
Basophils Relative: 0.7 % (ref 0.0–3.0)
Eosinophils Absolute: 0.2 K/uL (ref 0.0–0.7)
Eosinophils Relative: 2.3 % (ref 0.0–5.0)
HCT: 40.9 % (ref 36.0–46.0)
Hemoglobin: 13.6 g/dL (ref 12.0–15.0)
Lymphocytes Relative: 44.5 % (ref 12.0–46.0)
Lymphs Abs: 3.2 K/uL (ref 0.7–4.0)
MCHC: 33.2 g/dL (ref 30.0–36.0)
MCV: 85.8 fl (ref 78.0–100.0)
Monocytes Absolute: 0.5 K/uL (ref 0.1–1.0)
Monocytes Relative: 7.2 % (ref 3.0–12.0)
Neutro Abs: 3.2 K/uL (ref 1.4–7.7)
Neutrophils Relative %: 45.3 % (ref 43.0–77.0)
Platelets: 276 K/uL (ref 150.0–400.0)
RBC: 4.77 Mil/uL (ref 3.87–5.11)
RDW: 13.6 % (ref 11.5–15.5)
WBC: 7.2 K/uL (ref 4.0–10.5)

## 2023-10-10 LAB — COMPREHENSIVE METABOLIC PANEL WITH GFR
ALT: 14 U/L (ref 0–35)
AST: 18 U/L (ref 0–37)
Albumin: 4.4 g/dL (ref 3.5–5.2)
Alkaline Phosphatase: 60 U/L (ref 39–117)
BUN: 15 mg/dL (ref 6–23)
CO2: 29 meq/L (ref 19–32)
Calcium: 9.5 mg/dL (ref 8.4–10.5)
Chloride: 104 meq/L (ref 96–112)
Creatinine, Ser: 0.8 mg/dL (ref 0.40–1.20)
GFR: 75.61 mL/min (ref >60.00)
Glucose, Bld: 109 mg/dL — ABNORMAL HIGH (ref 70–99)
Potassium: 4 meq/L (ref 3.5–5.1)
Sodium: 142 meq/L (ref 135–145)
Total Bilirubin: 1 mg/dL (ref 0.2–1.2)
Total Protein: 7 g/dL (ref 6.0–8.3)

## 2023-10-10 LAB — URINALYSIS, ROUTINE W REFLEX MICROSCOPIC
Bilirubin Urine: NEGATIVE
Hgb urine dipstick: NEGATIVE
Ketones, ur: NEGATIVE
Nitrite: NEGATIVE
Specific Gravity, Urine: 1.02 (ref 1.000–1.030)
Total Protein, Urine: NEGATIVE
Urine Glucose: NEGATIVE
Urobilinogen, UA: 0.2 (ref 0.0–1.0)
pH: 6.5 (ref 5.0–8.0)

## 2023-10-10 LAB — LIPID PANEL
Cholesterol: 206 mg/dL — ABNORMAL HIGH (ref 0–200)
HDL: 68.2 mg/dL (ref >39.00)
LDL Cholesterol: 115 mg/dL — ABNORMAL HIGH (ref 0–99)
NonHDL: 138.29
Total CHOL/HDL Ratio: 3
Triglycerides: 117 mg/dL (ref 0.0–149.0)
VLDL: 23.4 mg/dL (ref 0.0–40.0)

## 2023-10-10 LAB — HEMOGLOBIN A1C: Hgb A1c MFr Bld: 5.9 % (ref 4.6–6.5)

## 2023-10-10 NOTE — Progress Notes (Signed)
 Established Patient Office Visit   Subjective:  Patient ID: Monique Rangel, female    DOB: 07-16-1955  Age: 68 y.o. MRN: [id number]  Chief Complaint  Patient presents with   Annual Exam    CPE. Pt is fasting.     HPI Encounter Diagnoses  Name Primary?   Healthcare maintenance Yes   Elevated LDL cholesterol level    Prediabetes    Vitamin D  deficiency    Gastroesophageal reflux disease, unspecified whether esophagitis present    GAD (generalized anxiety disorder)    B12 deficiency    Here for physical and follow-up of above.  Continues duloxetine  for anxiety and it is helpful.  She has been unable to discontinue the omeprazole  secondary to return of reflux symptoms.  Continues a stool softener for constipation and this is helpful.  She is active physically by watching her 35-year-old grandson 4 to 5 days a week.  She has regular dental care.  Up-to-date on health maintenance.   Review of Systems  Constitutional: Negative.   HENT: Negative.    Eyes:  Negative for blurred vision, discharge and redness.  Respiratory: Negative.    Cardiovascular: Negative.   Gastrointestinal:  Negative for abdominal pain.  Genitourinary: Negative.   Musculoskeletal: Negative.  Negative for myalgias.  Skin:  Negative for rash.  Neurological:  Negative for tingling, loss of consciousness and weakness.  Endo/Heme/Allergies:  Negative for polydipsia.      10/10/2023    8:27 AM 06/15/2023    1:50 PM 04/03/2023    1:51 PM  Depression screen PHQ 2/9  Decreased Interest 0 0 0  Down, Depressed, Hopeless 0 0 0  PHQ - 2 Score 0 0 0  Altered sleeping 0    Tired, decreased energy 1    Change in appetite 0    Feeling bad or failure about yourself  0    Trouble concentrating 0    Moving slowly or fidgety/restless 0    Suicidal thoughts 0    PHQ-9 Score 1    Difficult doing work/chores Not difficult at all        Current Outpatient Medications:    albuterol  (VENTOLIN  HFA) 108 (90 Base) MCG/ACT  inhaler, Inhale 1-2 puffs into the lungs every 6 (six) hours as needed for wheezing or shortness of breath., Disp: 8 g, Rfl: 0   Biotin (BIOTIN MAXIMUM STRENGTH) 10 MG TABS, Take by mouth., Disp: , Rfl:    Cholecalciferol (VITAMIN D3) 50 MCG (2000 UT) TABS, Take 2 tablets by mouth daily., Disp: , Rfl:    diclofenac  Sodium (VOLTAREN ) 1 % GEL, Apply a small grape sized amount to right knee every 6 hours as needed., Disp: 150 g, Rfl: 1   diphenhydrAMINE (BENADRYL) 25 MG tablet, Take 25 mg by mouth at bedtime as needed., Disp: , Rfl:    DULoxetine  (CYMBALTA ) 20 MG capsule, TAKE 2 CAPSULES BY MOUTH DAILY, Disp: 180 capsule, Rfl: 1   fluticasone (FLONASE) 50 MCG/ACT nasal spray, Place 2 sprays into both nostrils every morning., Disp: , Rfl:    gabapentin  (NEURONTIN ) 100 MG capsule, TAKE 2 CAPSULES BY MOUTH AT BEDTIME, Disp: 180 capsule, Rfl: 0   Multiple Vitamin (MULTI-VITAMIN) tablet, Take by mouth., Disp: , Rfl:    Multiple Vitamins-Minerals (PRESERVISION AREDS PO), Take by mouth., Disp: , Rfl:    omeprazole  (PRILOSEC) 40 MG capsule, TAKE 1 CAPSULE BY MOUTH DAILY, Disp: 90 capsule, Rfl: 1   Omeprazole  Magnesium (PRILOSEC PO), , Disp: , Rfl:  TART CHERRY PO, Take 1 tablet by mouth daily., Disp: , Rfl:    Objective:     BP 110/74 (BP Location: Right Arm, Patient Position: Sitting, Cuff Size: Normal)   Pulse 74   Temp 97.6 F (36.4 C) (Temporal)   Ht 5' 6 (1.676 m)   Wt 198 lb 9.6 oz (90.1 kg)   SpO2 98%   BMI 32.05 kg/m  Wt Readings from Last 3 Encounters:  10/10/23 198 lb 9.6 oz (90.1 kg)  07/24/23 201 lb 3.2 oz (91.3 kg)  06/15/23 198 lb 3.2 oz (89.9 kg)      Physical Exam Constitutional:      General: She is not in acute distress.    Appearance: Normal appearance. She is not ill-appearing, toxic-appearing or diaphoretic.  HENT:     Head: Normocephalic and atraumatic.     Right Ear: Tympanic membrane, ear canal and external ear normal.     Left Ear: Tympanic membrane, ear canal  and external ear normal.     Mouth/Throat:     Mouth: Mucous membranes are moist.     Pharynx: Oropharynx is clear. No oropharyngeal exudate or posterior oropharyngeal erythema.  Eyes:     General: No scleral icterus.       Right eye: No discharge.        Left eye: No discharge.     Extraocular Movements: Extraocular movements intact.     Conjunctiva/sclera: Conjunctivae normal.     Pupils: Pupils are equal, round, and reactive to light.  Cardiovascular:     Rate and Rhythm: Normal rate and regular rhythm.  Pulmonary:     Effort: Pulmonary effort is normal. No respiratory distress.     Breath sounds: Normal breath sounds. No wheezing or rales.  Abdominal:     General: Bowel sounds are normal.  Musculoskeletal:     Cervical back: Normal range of motion and neck supple. No rigidity or tenderness.  Lymphadenopathy:     Cervical: No cervical adenopathy.  Skin:    General: Skin is warm and dry.  Neurological:     Mental Status: She is alert and oriented to person, place, and time.  Psychiatric:        Mood and Affect: Mood normal.        Behavior: Behavior normal.      No results found for any visits on 10/10/23.    The 10-year ASCVD risk score (Arnett DK, et al., 2019) is: 5.9%    Assessment & Plan:   Healthcare maintenance -     CBC with Differential/Platelet -     Urinalysis, Routine w reflex microscopic  Elevated LDL cholesterol level -     Comprehensive metabolic panel with GFR -     Lipid panel  Prediabetes -     Comprehensive metabolic panel with GFR -     Hemoglobin A1c  Vitamin D  deficiency -     VITAMIN D  25 Hydroxy (Vit-D Deficiency, Fractures)  Gastroesophageal reflux disease, unspecified whether esophagitis present  GAD (generalized anxiety disorder)  B12 deficiency -     Vitamin B12    Return in about 6 months (around 04/11/2024), or if symptoms worsen or fail to improve.  Encouraged regular exercise for 30 minutes daily and ongoing weight  loss efforts.  Information was given on preventing type 2 diabetes.  Information given on health maintenance and disease prevention.  Continue all medications as above.  Adjustments made pending results of lab work.  Elsie Sim Lent, MD

## 2023-10-11 LAB — VITAMIN D 25 HYDROXY (VIT D DEFICIENCY, FRACTURES): VITD: 32.49 ng/mL (ref 30.00–100.00)

## 2023-10-11 LAB — VITAMIN B12: Vitamin B-12: 548 pg/mL (ref 211–911)

## 2023-10-12 ENCOUNTER — Ambulatory Visit: Payer: Self-pay | Admitting: Family Medicine

## 2023-11-03 ENCOUNTER — Other Ambulatory Visit: Payer: Self-pay | Admitting: Family Medicine

## 2023-11-22 ENCOUNTER — Ambulatory Visit: Payer: Self-pay

## 2023-11-22 NOTE — Telephone Encounter (Signed)
 FYI Only or Action Required?: FYI only for provider.  Patient was last seen in primary care on 10/10/2023 by [name], MD.  Called Nurse Triage reporting Sinus Problem.  Symptoms began 2 days ago.  Interventions attempted: OTC medications: sinus and cold medication.  Symptoms are: gradually worsening.  Triage Disposition: Home Care  Patient/caregiver understands and will follow disposition?: Appointment scheduled per patient's request           Copied from CRM #[id number]. Topic: Clinical - Red Word Triage >> Nov 22, 2023  3:54 PM Suzen RAMAN wrote: Red Word that prompted transfer to Nurse Triage: draining, sore throat,congestion and  extreme fatigue          Reason for Disposition  [1] Sinus congestion as part of a cold AND [2] present < 10 days  Answer Assessment - Initial Assessment Questions Patient reports history of asthma complications with sinus congestion in the past. Appointment scheduled for tomorrow per her request.      1. LOCATION: Where does it hurt?      No pain 2. ONSET: When did the sinus pain start?  (e.g., hours, days)      Congestion started 2 days ago  3. SEVERITY: How bad is the pain?   (Scale 0-10; or none, mild, moderate or severe)      0/10 4. RECURRENT SYMPTOM: Have you ever had sinus problems before? If Yes, ask: When was the last time? and What happened that time?      Yes 5. NASAL CONGESTION: Is the nose blocked? If Yes, ask: Can you open it or must you breathe through your mouth?     Yes 6. NASAL DISCHARGE: Do you have discharge from your nose? If so ask, What color?     Yellow/white  7. FEVER: Do you have a fever? If Yes, ask: What is it, how was it measured, and when did it start?      No 8. OTHER SYMPTOMS: Do you have any other symptoms? (e.g., sore throat, cough, earache, difficulty breathing)     Sore throat, fatigue  Protocols used: Sinus Pain or Congestion-A-AH

## 2023-11-23 ENCOUNTER — Ambulatory Visit (INDEPENDENT_AMBULATORY_CARE_PROVIDER_SITE_OTHER): Admitting: Nurse Practitioner

## 2023-11-23 ENCOUNTER — Encounter: Payer: Self-pay | Admitting: Nurse Practitioner

## 2023-11-23 VITALS — BP 128/86 | HR 73 | Temp 98.1°F | Ht 66.0 in | Wt 200.0 lb

## 2023-11-23 DIAGNOSIS — J0110 Acute frontal sinusitis, unspecified: Secondary | ICD-10-CM | POA: Diagnosis not present

## 2023-11-23 LAB — POC COVID19 BINAXNOW: SARS Coronavirus 2 Ag: NEGATIVE

## 2023-11-23 MED ORDER — FLUCONAZOLE 150 MG PO TABS: 150.0000 mg | ORAL_TABLET | Freq: Once | ORAL | 0 refills | Status: AC

## 2023-11-23 MED ORDER — AMOXICILLIN-POT CLAVULANATE 875-125 MG PO TABS
1.0000 | ORAL_TABLET | Freq: Two times a day (BID) | ORAL | 0 refills | Status: DC
Start: 2023-11-23 — End: 2023-12-31

## 2023-11-23 NOTE — Patient Instructions (Signed)
 It was great to see you!  Start augmentin  twice a day for 7 days  Take diflucan  after finishing antibiotic   Keep taking the sinus and cold medication   Drink plenty of fluids  Let's follow-up if your symptoms worsen or any concerns   Take care,  Tinnie Harada, NP

## 2023-11-23 NOTE — Progress Notes (Unsigned)
   Acute Office Visit  Subjective:     Patient ID: Monique Rangel, female    DOB: December 28, 1955, 68 y.o.   MRN: [id number]  Chief Complaint  Patient presents with   Sinusitis    With congestion, facial pain, sore throat, sinus drainage, dizziness, weakness    HPI Patient is in today for ***  ROS      Objective:    BP 128/86 (BP Location: Left Arm, Patient Position: Sitting, Cuff Size: Normal)   Pulse 73   Temp 98.1 F (36.7 C) (Oral)   Ht 5' 6 (1.676 m)   Wt 200 lb (90.7 kg)   SpO2 98%   BMI 32.28 kg/m  {Vitals History (Optional):23777}  Physical Exam  No results found for any visits on 11/23/23.      Assessment & Plan:   Problem List Items Addressed This Visit   None   No orders of the defined types were placed in this encounter.   No follow-ups on file.  Tinnie DELENA Harada, NP

## 2023-11-24 ENCOUNTER — Other Ambulatory Visit: Payer: Self-pay | Admitting: Family Medicine

## 2023-11-24 DIAGNOSIS — F411 Generalized anxiety disorder: Secondary | ICD-10-CM

## 2023-12-27 ENCOUNTER — Ambulatory Visit: Admitting: Nurse Practitioner

## 2023-12-31 ENCOUNTER — Ambulatory Visit
Admission: EM | Admit: 2023-12-31 | Discharge: 2023-12-31 | Disposition: A | Discharge status: Home / Self Care | Attending: Family Medicine | Admitting: Family Medicine

## 2023-12-31 DIAGNOSIS — N3001 Acute cystitis with hematuria: Secondary | ICD-10-CM | POA: Diagnosis not present

## 2023-12-31 DIAGNOSIS — R300 Dysuria: Secondary | ICD-10-CM

## 2023-12-31 LAB — POCT URINE DIPSTICK
Bilirubin, UA: NEGATIVE
Glucose, UA: NEGATIVE mg/dL
Ketones, POC UA: NEGATIVE mg/dL
Nitrite, UA: NEGATIVE
POC PROTEIN,UA: 30 — AB
Spec Grav, UA: >=1.03 — AB (ref 1.010–1.025)
Urobilinogen, UA: 0.2 U/dL
pH, UA: 6 (ref 5.0–8.0)

## 2023-12-31 MED ORDER — AMOXICILLIN 500 MG PO CAPS: 500.0000 mg | ORAL_CAPSULE | Freq: Two times a day (BID) | ORAL | 0 refills | Status: AC

## 2023-12-31 NOTE — ED Provider Notes (Signed)
 [facility]    CSN: [id number] Arrival date & time: 12/31/23  1543      History   Chief Complaint Chief Complaint  Patient presents with   Dysuria    HPI Monique Rangel is a 68 y.o. female presents for dysuria.  Patient reports 2 days of urinary burning, urgency, frequency.  No hematuria, fevers, vomiting or flank pain.  No vaginal discharge or STD concern.  Has used no OTC treatments for symptoms.  No other concerns at this time   Dysuria   Past Medical History:  Diagnosis Date   Allergy    Anxiety 05/11/2016   Arthritis    Asthma    Cataract    Depression    GERD (gastroesophageal reflux disease)    Myocardial infarction [facility])     Patient Active Problem List   Diagnosis Date Noted   Positive ANA (antinuclear antibody) 05/01/2023   Neck pain 05/01/2023   Dysuria 04/20/2023   Prediabetes 03/27/2023   Pain of finger of right hand 03/27/2023   Healthcare maintenance 10/07/2022   Elevated LDL cholesterol level 10/07/2022   Dental anomaly 10/07/2022   UTI symptoms 09/22/2022   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) 08/23/2022   Hyperlipidemia with target LDL less than 100 08/21/2022   Dilatation of thoracic aorta 08/15/2022   Chest pain of uncertain etiology 07/18/2022   Fatigue 07/18/2022   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/20/2022   Herpes labialis 09/06/2021   SOB (shortness of breath) 09/06/2021   Slow transit constipation 05/25/2021   Gastroesophageal reflux disease 05/21/2021   Epigastric pain 05/21/2021   Gastroenteritis 05/21/2021   Lipoma of buttock 04/27/2021   Seborrheic keratosis 04/27/2021   Viral upper respiratory tract infection 02/10/2021   History of torn meniscus of left knee 11/26/2020   Primary osteoarthritis of left knee 11/26/2020   Chronic pain of both knees 05/12/2020   Onychomycosis 05/12/2020   GAD (generalized anxiety disorder) 04/14/2020   Asymptomatic  postmenopausal estrogen deficiency 04/14/2020   Degenerative disc disease, cervical 04/14/2020   Migraine 04/14/2020   Macular degeneration, bilateral 04/14/2020   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      Home Medications    Prior to Admission medications   Medication Sig Start Date End Date Taking? Authorizing Provider  amoxicillin  (AMOXIL ) 500 MG capsule Take 1 capsule (500 mg total) by mouth 2 (two) times daily for 7 days. 12/31/23 01/07/24 Yes Aseel Truxillo, Jodi R, NP  albuterol  (VENTOLIN  HFA) 108 (90 Base) MCG/ACT inhaler Inhale 1-2 puffs into the lungs every 6 (six) hours as needed for wheezing or shortness of breath. 06/15/23   Sebastian Beverley NOVAK, MD  Biotin (BIOTIN MAXIMUM STRENGTH) 10 MG TABS Take by mouth. 10/19/18   [provider]  Cholecalciferol (VITAMIN D3) 50 MCG (2000 UT) TABS Take 2 tablets by mouth daily.    [provider]  diclofenac  Sodium (VOLTAREN ) 1 % GEL Apply a small grape sized amount to right knee every 6 hours as needed. 11/26/20   [name], MD  diphenhydrAMINE (BENADRYL) 25 MG tablet Take 25 mg by mouth at bedtime as needed.    [provider]  DULoxetine  (CYMBALTA ) 20 MG capsule TAKE 2 CAPSULES BY MOUTH DAILY 11/24/23   [name], MD  fluticasone [facility]) 50 MCG/ACT nasal spray Place 2 sprays into both nostrils every morning.    [provider]  gabapentin  (NEURONTIN ) 100 MG capsule TAKE 2 CAPSULES BY MOUTH AT BEDTIME 11/03/23   Smith, Zachary M, DO  Multiple Vitamin (MULTI-VITAMIN) tablet Take by mouth. 07/11/19   [provider]  Multiple  Vitamins-Minerals (PRESERVISION AREDS PO) Take by mouth.    [provider]  omeprazole  (PRILOSEC) 40 MG capsule TAKE 1 CAPSULE BY MOUTH DAILY 10/02/23   [name], MD  Omeprazole  Magnesium (PRILOSEC PO)     [provider]  TART CHERRY PO Take 1 tablet by mouth daily.    [provider]    Family History Family History  Problem Relation Age of Onset   Hearing loss Mother    Miscarriages / [place] Mother    Cancer Father    Diabetes Father    Stroke Father    Hearing loss Sister    Arthritis Maternal Aunt    Vision loss Maternal Aunt    Early death Maternal Grandfather    Early death Maternal Grandmother    Drug abuse Paternal Grandfather    Hearing loss Paternal Grandfather    Heart disease Paternal Grandfather    Cancer Paternal Grandmother    Early death Paternal Grandmother    Varicose Veins Paternal Grandmother    Depression Maternal Aunt    Breast cancer Neg Hx     Social History Social History   Tobacco Use   Smoking status: Never    Passive exposure: Past   Smokeless tobacco: Never  Vaping Use   Vaping status: Never Used  Substance Use Topics   Alcohol use: Yes    Alcohol/week: 2.0 standard drinks of alcohol    Types: 2 Glasses of wine per week    Comment: usually with a meal   Drug use: Never     Allergies   Keflex  [cephalexin ], Lamisil  [terbinafine ], and Quinine   Review of Systems Review of Systems  Genitourinary:  Positive for dysuria.     Physical Exam Triage Vital Signs ED Triage Vitals  Encounter Vitals Group     BP 12/31/23 1550 (!) 141/92     Girls Systolic BP Percentile --      Girls Diastolic BP Percentile --      Boys Systolic BP Percentile --      Boys Diastolic BP Percentile --      Pulse Rate 12/31/23 1550 96     Resp 12/31/23 1550 18     Temp 12/31/23 1550 97.7 F (36.5 C)     Temp Source 12/31/23 1550 Oral     SpO2 12/31/23 1550 95 %     Weight --      Height --      Head  Circumference --      Peak Flow --      Pain Score 12/31/23 1556 0     Pain Loc --      Pain Education --      Exclude from Growth Chart --    No data found.  Updated Vital Signs BP (!) 141/92 (BP Location: Right Arm)   Pulse 96   Temp 97.7 F (36.5 C) (Oral)   Resp 18  SpO2 95%   Visual Acuity Right Eye Distance:   Left Eye Distance:   Bilateral Distance:    Right Eye Near:   Left Eye Near:    Bilateral Near:     Physical Exam Vitals and nursing note reviewed.  Constitutional:      Appearance: Normal appearance.  HENT:     Head: Normocephalic and atraumatic.  Eyes:     Pupils: Pupils are equal, round, and reactive to light.  Cardiovascular:     Rate and Rhythm: Normal rate.  Pulmonary:     Effort: Pulmonary effort is normal.  Skin:    General: Skin is warm and dry.  Neurological:     General: No focal deficit present.     Mental Status: She is alert and oriented to person, place, and time.  Psychiatric:        Mood and Affect: Mood normal.        Behavior: Behavior normal.      UC Treatments / Results  Labs (all labs ordered are listed, but only abnormal results are displayed) Labs Reviewed  POCT URINE DIPSTICK - Abnormal; Notable for the following components:      Result Value   Clarity, UA hazy (*)    Spec Grav, UA >=1.030 (*)    Blood, UA small (*)    POC PROTEIN,UA =30 (*)    Leukocytes, UA Small (1+) (*)    All other components within normal limits    EKG   Radiology No results found.  Procedures Procedures (including critical care time)  Medications Ordered in UC Medications - No data to display  Initial Impression / Assessment and Plan / UC Course  I have reviewed the triage vital signs and the nursing notes.  Pertinent labs & imaging results that were available during my care of the patient were reviewed by me and considered in my medical decision making (see chart for details).     Reviewed exam and symptoms with patient.   No red flags.  Urine positive for UTI will start amoxicillin  twice daily for 7 days.  Patient unable to leave enough urine for culture. Advise rest fluids and PCP follow-up if symptoms do not improve.  ER precautions reviewed Final Clinical Impressions(s) / UC Diagnoses   Final diagnoses:  Dysuria  Acute cystitis with hematuria     Discharge Instructions      Start amoxicillin  twice daily for 7 days.  Lots of rest and fluids.  Please follow-up with your PCP if your symptoms do not improve.  Please go to the ER if you develop any worsening symptoms.  I hope you feel better soon!    ED Prescriptions     Medication Sig Dispense Auth. Provider   amoxicillin  (AMOXIL ) 500 MG capsule Take 1 capsule (500 mg total) by mouth 2 (two) times daily for 7 days. 14 capsule Axell Trigueros, Jodi R, NP      PDMP not reviewed this encounter.   Loreda Myla SAUNDERS, NP 12/31/23 [phone]

## 2023-12-31 NOTE — Discharge Instructions (Addendum)
 Start amoxicillin  twice daily for 7 days.  Lots of rest and fluids.  Please follow-up with your PCP if your symptoms do not improve.  Please go to the ER if you develop any worsening symptoms.  I hope you feel better soon!

## 2023-12-31 NOTE — ED Triage Notes (Signed)
 Pt reports increase urinary frequency, burning when urinating and pressure x 2 days.

## 2024-01-08 DIAGNOSIS — H26492 Other secondary cataract, left eye: Secondary | ICD-10-CM | POA: Diagnosis not present

## 2024-01-08 DIAGNOSIS — H43812 Vitreous degeneration, left eye: Secondary | ICD-10-CM | POA: Diagnosis not present

## 2024-01-08 DIAGNOSIS — H04123 Dry eye syndrome of bilateral lacrimal glands: Secondary | ICD-10-CM | POA: Diagnosis not present

## 2024-01-08 DIAGNOSIS — H524 Presbyopia: Secondary | ICD-10-CM | POA: Diagnosis not present

## 2024-01-08 DIAGNOSIS — H35363 Drusen (degenerative) of macula, bilateral: Secondary | ICD-10-CM | POA: Diagnosis not present

## 2024-01-16 ENCOUNTER — Encounter: Payer: Self-pay | Admitting: Family Medicine

## 2024-01-16 ENCOUNTER — Ambulatory Visit: Admitting: Family Medicine

## 2024-01-16 VITALS — BP 124/70 | HR 83 | Temp 98.2°F | Ht 66.0 in | Wt 201.0 lb

## 2024-01-16 DIAGNOSIS — J069 Acute upper respiratory infection, unspecified: Secondary | ICD-10-CM | POA: Diagnosis not present

## 2024-01-16 LAB — POC COVID19 BINAXNOW: SARS Coronavirus 2 Ag: NEGATIVE

## 2024-01-16 LAB — POCT INFLUENZA A/B
Influenza A, POC: NEGATIVE
Influenza B, POC: NEGATIVE

## 2024-01-16 NOTE — Assessment & Plan Note (Signed)
 Discussed home care for viral illness, including rest, pushing fluids, and OTC medications as needed for symptom relief. Recommend hot tea with honey for sore throat symptoms. Follow-up if needed for worsening or persistent symptoms.

## 2024-01-16 NOTE — Progress Notes (Signed)
 [facility] [address] [place] [address] Dept: [phone] Dept Fax: [phone]  Office Visit  Subjective:    Patient ID: Monique Rangel All, female    DOB: November 13, 1955, 68 y.o..   MRN: [id number]  Chief Complaint  Patient presents with   Sinus Problem    C/o having sinus drainage/ST marcie in RT ear x 2-3 days.  Has taken Advil.    History of Present Illness:  Patient is in today for possible sinus and ear infection. She notes she began having symptoms on Saturday. She has noted nasal congestion with rhinorrhea and PND, sinus pressure, ear congestion, and sore throat. She has not been running fever. She has had some body aches. She took some Advil for this, but has not taken other cold medicines at this point.  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/20/2022   Herpes labialis 09/06/2021   SOB (shortness of breath) 09/06/2021   Slow transit constipation 05/25/2021   Gastroesophageal reflux disease 05/21/2021   Epigastric pain 05/21/2021   Gastroenteritis 05/21/2021   Lipoma of buttock 04/27/2021   Seborrheic keratosis 04/27/2021   Viral upper respiratory tract infection 02/10/2021   History of torn meniscus of left knee 11/26/2020   Primary osteoarthritis of left knee 11/26/2020    Chronic pain of both knees 05/12/2020   Onychomycosis 05/12/2020   GAD (generalized anxiety disorder) 04/14/2020   Asymptomatic postmenopausal estrogen deficiency 04/14/2020   Degenerative disc disease, cervical 04/14/2020   Migraine 04/14/2020   Macular degeneration, bilateral 04/14/2020   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   Sensorineural hearing loss (SNHL) of both ears 02/15/2017   Mild intermittent asthma without complication 03/14/2000   Past Surgical History:  Procedure Laterality Date   EYE SURGERY  both cataracts   FINGER SURGERY Right    benign tumor on right middle finger   HERNIA REPAIR  1992   Family History  Problem Relation Age of Onset   Hearing loss Mother    Miscarriages / Stillbirths Mother    Cancer Father    Diabetes Father    Stroke Father    Hearing loss Sister    Arthritis Maternal Aunt    Vision loss Maternal Aunt    Early death Maternal Grandfather    Early death Maternal Grandmother    Drug abuse Paternal Grandfather    Hearing loss Paternal Grandfather    Heart disease Paternal Grandfather    Cancer Paternal Grandmother    Early death Paternal Grandmother    Varicose Veins Paternal Grandmother    Depression Maternal Aunt    Breast cancer Neg Hx    Outpatient Medications Prior to Visit  Medication Sig Dispense Refill   albuterol  (VENTOLIN  HFA) 108 (90 Base) MCG/ACT inhaler Inhale 1-2 puffs into the lungs every 6 (six) hours as needed for  wheezing or shortness of breath. 8 g 0   Biotin (BIOTIN MAXIMUM STRENGTH) 10 MG TABS Take by mouth.     Cholecalciferol (VITAMIN D3) 50 MCG (2000 UT) TABS Take 2 tablets by mouth daily.     diclofenac  Sodium (VOLTAREN ) 1 % GEL Apply a small grape sized amount to right knee every 6 hours as needed. 150 g 1   diphenhydrAMINE (BENADRYL) 25 MG tablet Take 25 mg by mouth at bedtime as needed.     DULoxetine  (CYMBALTA ) 20 MG capsule TAKE 2 CAPSULES BY MOUTH DAILY 180 capsule 1    fluticasone (FLONASE) 50 MCG/ACT nasal spray Place 2 sprays into both nostrils every morning.     gabapentin  (NEURONTIN ) 100 MG capsule TAKE 2 CAPSULES BY MOUTH AT BEDTIME 180 capsule 0   Multiple Vitamin (MULTI-VITAMIN) tablet Take by mouth.     Multiple Vitamins-Minerals (PRESERVISION AREDS PO) Take by mouth.     omeprazole  (PRILOSEC) 40 MG capsule TAKE 1 CAPSULE BY MOUTH DAILY 90 capsule 1   Omeprazole  Magnesium (PRILOSEC PO)      TART CHERRY PO Take 1 tablet by mouth daily.     No facility-administered medications prior to visit.   Allergies  Allergen Reactions   Keflex  [Cephalexin ] Other (See Comments)    Abdominal pain and Nausea   Lamisil  [Terbinafine ] Rash   Quinine Other (See Comments)    Mother had a bad reaction so does not want to use it Mother had a bad reaction so does not want to use it      Objective:   Today's Vitals   01/16/24 1339  BP: 124/70  Pulse: 83  Temp: 98.2 F (36.8 C)  TempSrc: Temporal  SpO2: 97%  Weight: 201 lb (91.2 kg)  Height: 5' 6 (1.676 m)   Body mass index is 32.44 kg/m.   General: Well developed, well nourished. No acute distress. HEENT: Normocephalic, non-traumatic. PERRL, EOMI. Conjunctiva clear. External ears normal. EAC normal. TMs are   mildly bulging, but without redness and with normal LR bilaterally. Nose mildly congested, but no rhinorrhea. Mucous   membranes moist. Mild mucous streaking of posterior oropharynx. Good dentition. Neck: Supple. No lymphadenopathy. No thyromegaly. Lungs: Clear to auscultation bilaterally. No wheezing, rales or rhonchi. Psych: Alert and oriented. Normal mood and affect.  There are no preventive care reminders to display for this patient.  Lab Results POCT Covid: Neg. POCT Influenza A& B: Neg.    Assessment & Plan:   Problem List Items Addressed This Visit       Respiratory   Viral URI - Primary   Discussed home care for viral illness, including rest, pushing fluids, and OTC medications  as needed for symptom relief. Recommend hot tea with honey for sore throat symptoms. Follow-up if needed for worsening or persistent symptoms.       Relevant Orders   POC COVID-19 (Completed)   POCT Influenza A/B (Completed)    No follow-ups on file.    Garnette CHRISTELLA Simpler, MD  I,Emily Lagle,acting as a scribe for Garnette CHRISTELLA Simpler, MD.,have documented all relevant documentation on the behalf of Garnette CHRISTELLA Simpler, MD.  I, Garnette CHRISTELLA Simpler, MD, have reviewed all documentation for this visit. The documentation on 01/16/2024 for the exam, diagnosis, procedures, and orders are all accurate and complete.

## 2024-01-18 ENCOUNTER — Ambulatory Visit: Payer: Self-pay

## 2024-01-18 NOTE — Telephone Encounter (Signed)
 Patient schedule 01/19/24. Dm/cma

## 2024-01-18 NOTE — Telephone Encounter (Signed)
 FYI Only or Action Required?: FYI only for provider: appointment scheduled on 11/7.  Patient was last seen in primary care on 01/16/2024 by Thedora Garnette HERO, MD.  Called Nurse Triage reporting Leg Pain.  Symptoms began several days ago.  Interventions attempted: OTC medications: Advil.  Symptoms are: gradually worsening.  Triage Disposition: See PCP When Office is Open (Within 3 Days)  Patient/caregiver understands and will follow disposition?: Yes      Copied from CRM #[id number]. Topic: Clinical - Red Word Triage >> Jan 18, 2024 11:52 AM Revonda D wrote: Red Word that prompted transfer to Nurse Triage: pain  Pt stated that she is experiencing leg pain and its excruciating. Pt stated that the pain is in her right back leg from her hip down to her knee. Pt would like to schedule an appt with the provider. >> Jan 18, 2024 12:02 PM Aisha D wrote: Pt stated that she will callback later.        Reason for Disposition  [1] MODERATE pain (e.g., interferes with normal activities, limping) AND [2] present > 3 days  Answer Assessment - Initial Assessment Questions 1. ONSET: When did the pain start?      4 days ago  2. LOCATION: Where is the pain located?      Back of right leg radiating from her hip down to her knee  3. PAIN: How bad is the pain?    (Scale 1-10; or mild, moderate, severe)     Moderate to severe  4. WORK OR EXERCISE: Has there been any recent work or exercise that involved this part of the body?      No 5. CAUSE: What do you think is causing the leg pain?     Unsure 6. OTHER SYMPTOMS: Do you have any other symptoms? (e.g., chest pain, back pain, breathing difficulty, swelling, rash, fever, numbness, weakness)     No  Protocols used: Leg Pain-A-AH

## 2024-01-19 ENCOUNTER — Encounter: Payer: Self-pay | Admitting: Nurse Practitioner

## 2024-01-19 ENCOUNTER — Ambulatory Visit: Admitting: Nurse Practitioner

## 2024-01-19 VITALS — BP 128/82 | HR 78 | Temp 98.0°F | Ht 66.0 in | Wt 202.2 lb

## 2024-01-19 DIAGNOSIS — M5441 Lumbago with sciatica, right side: Secondary | ICD-10-CM

## 2024-01-19 MED ORDER — KETOROLAC TROMETHAMINE 30 MG/ML IJ SOLN
30.0000 mg | Freq: Once | INTRAMUSCULAR | Status: AC
  Administered 2024-01-19: 30 mg via INTRAMUSCULAR

## 2024-01-19 MED ORDER — TIZANIDINE HCL 4 MG PO TABS: 4.0000 mg | ORAL_TABLET | Freq: Four times a day (QID) | ORAL | 0 refills | Status: DC | PRN

## 2024-01-19 MED ORDER — TIZANIDINE HCL 4 MG PO TABS: 4.0000 mg | ORAL_TABLET | Freq: Three times a day (TID) | ORAL | 0 refills | Status: AC | PRN

## 2024-01-19 MED ORDER — ACETAMINOPHEN ER 650 MG PO TBCR: 650.0000 mg | EXTENDED_RELEASE_TABLET | Freq: Three times a day (TID) | ORAL | Status: AC | PRN

## 2024-01-19 MED ORDER — PREDNISONE 10 MG (21) PO TBPK: ORAL_TABLET | ORAL | 0 refills | Status: DC

## 2024-01-19 NOTE — Patient Instructions (Addendum)
 Hold oral NSAIDs while taking oral prednisone . Ok to use tylenol with oral prednisone  Do not take zanaflex and drive due to risk of drowsiness. Alternate between warm and cold compress as needed  Acute Back Pain, Adult Acute back pain is sudden and usually short-lived. It is often caused by an injury to the muscles and tissues in the back. The injury may result from: A muscle, tendon, or ligament getting overstretched or torn. Ligaments are tissues that connect bones to each other. Lifting something improperly can cause a back strain. Wear and tear (degeneration) of the spinal disks. Spinal disks are circular tissue that provide cushioning between the bones of the spine (vertebrae). Twisting motions, such as while playing sports or doing yard work. A hit to the back. Arthritis. You may have a physical exam, lab tests, and imaging tests to find the cause of your pain. Acute back pain usually goes away with rest and home care. Follow these instructions at home: Managing pain, stiffness, and swelling Take over-the-counter and prescription medicines only as told by your health care provider. Treatment may include medicines for pain and inflammation that are taken by mouth or applied to the skin, or muscle relaxants. Your health care provider may recommend applying ice during the first 24-48 hours after your pain starts. To do this: Put ice in a plastic bag. Place a towel between your skin and the bag. Leave the ice on for 20 minutes, 2-3 times a day. Remove the ice if your skin turns bright red. This is very important. If you cannot feel pain, heat, or cold, you have a greater risk of damage to the area. If directed, apply heat to the affected area as often as told by your health care provider. Use the heat source that your health care provider recommends, such as a moist heat pack or a heating pad. Place a towel between your skin and the heat source. Leave the heat on for 20-30 minutes. Remove  the heat if your skin turns bright red. This is especially important if you are unable to feel pain, heat, or cold. You have a greater risk of getting burned. Activity  Do not stay in bed. Staying in bed for more than 1-2 days can delay your recovery. Sit up and stand up straight. Avoid leaning forward when you sit or hunching over when you stand. If you work at a desk, sit close to it so you do not need to lean over. Keep your chin tucked in. Keep your neck drawn back, and keep your elbows bent at a 90-degree angle (right angle). Sit high and close to the steering wheel when you drive. Add lower back (lumbar) support to your car seat, if needed. Take short walks on even surfaces as soon as you are able. Try to increase the length of time you walk each day. Do not sit, drive, or stand in one place for more than 30 minutes at a time. Sitting or standing for long periods of time can put stress on your back. Do not drive or use heavy machinery while taking prescription pain medicine. Use proper lifting techniques. When you bend and lift, use positions that put less stress on your back: [place] your knees. Keep the load close to your body. Avoid twisting. Exercise regularly as told by your health care provider. Exercising helps your back heal faster and helps prevent back injuries by keeping muscles strong and flexible. Work with a physical therapist to make a safe exercise program, as  recommended by your health care provider. Do any exercises as told by your physical therapist. Lifestyle Maintain a healthy weight. Extra weight puts stress on your back and makes it difficult to have good posture. Avoid activities or situations that make you feel anxious or stressed. Stress and anxiety increase muscle tension and can make back pain worse. Learn ways to manage anxiety and stress, such as through exercise. General instructions Sleep on a firm mattress in a comfortable position. Try lying on your side with  your knees slightly bent. If you lie on your back, put a pillow under your knees. Keep your head and neck in a straight line with your spine (neutral position) when using electronic equipment like smartphones or pads. To do this: Raise your smartphone or pad to look at it instead of bending your head or neck to look down. Put the smartphone or pad at the level of your face while looking at the screen. Follow your treatment plan as told by your health care provider. This may include: Cognitive or behavioral therapy. Acupuncture or massage therapy. Meditation or yoga. Contact a health care provider if: You have pain that is not relieved with rest or medicine. You have increasing pain going down into your legs or buttocks. Your pain does not improve after 2 weeks. You have pain at night. You lose weight without trying. You have a fever or chills. You develop nausea or vomiting. You develop abdominal pain. Get help right away if: You develop new bowel or bladder control problems. You have unusual weakness or numbness in your arms or legs. You feel faint. These symptoms may represent a serious problem that is an emergency. Do not wait to see if the symptoms will go away. Get medical help right away. Call your local emergency services (911 in the [place] Do not drive yourself to the hospital. Summary Acute back pain is sudden and usually short-lived. Use proper lifting techniques. When you bend and lift, use positions that put less stress on your back. Take over-the-counter and prescription medicines only as told by your health care provider, and apply heat or ice as told. This information is not intended to replace advice given to you by your health care provider. Make sure you discuss any questions you have with your health care provider. Document Revised: 05/22/2020 Document Reviewed: 05/22/2020 Elsevier Patient Education  2024 [employer].

## 2024-01-19 NOTE — Progress Notes (Signed)
 Acute Office Visit  Subjective:    Patient ID: Monique Rangel, female    DOB: Jan 09, 1956, 68 y.o.   MRN: [id number]  Chief Complaint  Patient presents with   Leg Pain    Started 4-5 days ago as a dull pain then worsen into pain that cause for me to not want to move it, worse with movement    Back Pain This is a new problem. The current episode started in the past 7 days. The problem occurs constantly. The problem is unchanged. The pain is present in the gluteal (and right leg). The quality of the pain is described as aching and burning. The pain radiates to the right thigh. The pain is severe. The pain is The same all the time. The symptoms are aggravated by lying down, sitting and twisting. Stiffness is present All day. Associated symptoms include leg pain. Pertinent negatives include no abdominal pain, bladder incontinence, bowel incontinence, chest pain, dysuria, fever, headaches, numbness, paresis, paresthesias, pelvic pain, perianal numbness, tingling, weakness or weight loss. Risk factors include poor posture and obesity (onset after moving boxes for a friend). She has tried NSAIDs for the symptoms. The treatment provided mild relief.   Outpatient Medications Prior to Visit  Medication Sig   albuterol  (VENTOLIN  HFA) 108 (90 Base) MCG/ACT inhaler Inhale 1-2 puffs into the lungs every 6 (six) hours as needed for wheezing or shortness of breath.   Biotin (BIOTIN MAXIMUM STRENGTH) 10 MG TABS Take by mouth.   Cholecalciferol (VITAMIN D3) 50 MCG (2000 UT) TABS Take 2 tablets by mouth daily.   diclofenac  Sodium (VOLTAREN ) 1 % GEL Apply a small grape sized amount to right knee every 6 hours as needed.   diphenhydrAMINE (BENADRYL) 25 MG tablet Take 25 mg by mouth at bedtime as needed.   DULoxetine  (CYMBALTA ) 20 MG capsule TAKE 2 CAPSULES BY MOUTH DAILY   fluticasone (FLONASE) 50 MCG/ACT nasal spray Place 2 sprays into both nostrils every morning.   gabapentin  (NEURONTIN ) 100 MG capsule TAKE 2  CAPSULES BY MOUTH AT BEDTIME   Multiple Vitamin (MULTI-VITAMIN) tablet Take by mouth.   Multiple Vitamins-Minerals (PRESERVISION AREDS PO) Take by mouth.   omeprazole  (PRILOSEC) 40 MG capsule TAKE 1 CAPSULE BY MOUTH DAILY   Omeprazole  Magnesium (PRILOSEC PO)    TART CHERRY PO Take 1 tablet by mouth daily.   No facility-administered medications prior to visit.    Reviewed past medical and social history.  Review of Systems  Constitutional:  Negative for fever and weight loss.  Cardiovascular:  Negative for chest pain.  Gastrointestinal:  Negative for abdominal pain and bowel incontinence.  Genitourinary:  Negative for bladder incontinence, dysuria and pelvic pain.  Musculoskeletal:  Positive for back pain.  Neurological:  Negative for tingling, weakness, numbness, headaches and paresthesias.   Per HPI     Objective:    Physical Exam Vitals reviewed.  Pulmonary:     Effort: Pulmonary effort is normal.  Abdominal:     General: There is no distension.     Palpations: Abdomen is soft.     Tenderness: There is no abdominal tenderness. There is no right CVA tenderness, left CVA tenderness or guarding.  Musculoskeletal:     Lumbar back: Spasms and tenderness present. No swelling, edema, deformity, signs of trauma, lacerations or bony tenderness. Normal range of motion. Negative right straight leg raise test and negative left straight leg raise test. No scoliosis.     Right hip: Tenderness present. No deformity, lacerations, bony  tenderness or crepitus. Normal range of motion. Normal strength.     Left hip: Normal.     Right upper leg: Normal.     Left upper leg: Normal.     Right lower leg: No edema.     Left lower leg: No edema.  Skin:    General: Skin is warm and dry.     Findings: No erythema or rash.  Neurological:     Mental Status: She is alert and oriented to person, place, and time.    BP 128/82 (BP Location: Left Arm, Patient Position: Sitting, Cuff Size: Large)    Pulse 78   Temp 98 F (36.7 C) (Oral)   Ht 5' 6 (1.676 m)   Wt 202 lb 3.2 oz (91.7 kg)   SpO2 98%   BMI 32.64 kg/m    No results found for any visits on 01/19/24.     Assessment & Plan:   Problem List Items Addressed This Visit   None Visit Diagnoses       Acute right-sided low back pain with right-sided sciatica    -  Primary   Relevant Medications   ketorolac (TORADOL) 30 MG/ML injection 30 mg   predniSONE  (STERAPRED UNI-PAK 21 TAB) 10 MG (21) TBPK tablet   acetaminophen (TYLENOL 8 HOUR) 650 MG CR tablet   tiZANidine (ZANAFLEX) 4 MG tablet      Meds ordered this encounter  Medications   ketorolac (TORADOL) 30 MG/ML injection 30 mg   predniSONE  (STERAPRED UNI-PAK 21 TAB) 10 MG (21) TBPK tablet    Sig: As directed on package    Dispense:  21 tablet    Refill:  0    Supervising Provider:   BERNETA FALLOW ALFRED [[id number]]   DISCONTD: tiZANidine (ZANAFLEX) 4 MG tablet    Sig: Take 1 tablet (4 mg total) by mouth every 6 (six) hours as needed for muscle spasms.    Dispense:  30 tablet    Refill:  0    Supervising Provider:   BERNETA FALLOW ALFRED [[id number]]   acetaminophen (TYLENOL 8 HOUR) 650 MG CR tablet    Sig: Take 1 tablet (650 mg total) by mouth every 8 (eight) hours as needed for pain.    Supervising Provider:   BERNETA FALLOW ALFRED [[id number]]   tiZANidine (ZANAFLEX) 4 MG tablet    Sig: Take 1 tablet (4 mg total) by mouth every 8 (eight) hours as needed for muscle spasms.    Dispense:  21 tablet    Refill:  0    Supervising Provider:   BERNETA FALLOW ALFRED [[id number]]   Advised to Hold oral NSAIDs while taking oral prednisone . Ok to use tylenol with oral prednisone  Do not take zanaflex and drive due to risk of drowsiness. Alternate between warm and cold compress as needed Provided ED precautions.  Return if symptoms worsen or fail to improve.    Roselie Mood, NP

## 2024-01-22 ENCOUNTER — Ambulatory Visit: Payer: Self-pay | Admitting: *Deleted

## 2024-01-22 NOTE — Telephone Encounter (Signed)
 FYI Only or Action Required?: FYI only for provider: requesting medication , told to call back if not better or worsening sx. Last seen 01/16/24 by Dr. Thedora for these sx .  Patient was last seen in primary care on 01/19/2024 by Nche, Roselie Rockford, NP.  Called Nurse Triage reporting Cough.  Symptoms began several days ago.  Interventions attempted: Rest, hydration, or home remedies.  Symptoms are: gradually worsening.  Triage Disposition: See Physician Within 24 Hours  Patient/caregiver understands and will follow disposition?: No, wishes to speak with PCP    See NT encounter , requesting call back or if Dr. Thedora will prescribed medication since last OV 01/16/24.            Copied from CRM [id number]. Topic: Clinical - Red Word Triage >> Jan 22, 2024  3:49 PM Robinson H wrote: [facility] that prompted transfer to Nurse Triage: Diagnosed with respiratory virus a week ago was advised to notify provider if she isn't feeling better and patient states she's feeling worse, gotten more congested and traveled to her lungs and has asthma and more phlegm and thicker worried since she has the asthma. Reason for Disposition  [1] Known COPD or other severe lung disease (i.e., bronchiectasis, cystic fibrosis, lung surgery) AND [2] symptoms getting worse (i.e., increased sputum purulence or amount, increased breathing difficulty  Answer Assessment - Initial Assessment Questions Patient told to call back if sx worsening. Patient reports she is now coughing up green phlegm and dark in color at times. Everyone in household now getting sx. Offered appt with other provider tomorrow and patient requesting if PCP or Dr. Thedora whom saw patient last will prescribed antibiotics. Please advise. No appt scheduled.   No chest pain no difficulty breathing no fever.      1. ONSET: When did the cough begin?      Getting worse 2 days ago  2. SEVERITY: How bad is the cough today?      Getting worse 3.  SPUTUM: Describe the color of your sputum (e.g., none, dry cough; clear, white, yellow, green)     Green  dark colored sputum 4. HEMOPTYSIS: Are you coughing up any blood? If Yes, ask: How much? (e.g., flecks, streaks, tablespoons, etc.)     No  5. DIFFICULTY BREATHING: Are you having difficulty breathing? If Yes, ask: How bad is it? (e.g., mild, moderate, severe)      None  6. FEVER: Do you have a fever? If Yes, ask: What is your temperature, how was it measured, and when did it start?     na 7. CARDIAC HISTORY: Do you have any history of heart disease? (e.g., heart attack, congestive heart failure)      na 8. LUNG HISTORY: Do you have any history of lung disease?  (e.g., pulmonary embolus, asthma, emphysema)     Hx asthma 9. PE RISK FACTORS: Do you have a history of blood clots? (or: recent major surgery, recent prolonged travel, bedridden)     na 10. OTHER SYMPTOMS: Do you have any other symptoms? (e.g., runny nose, wheezing, chest pain)       Coughing up green phlegm and dark in color at times.  11. PREGNANCY: Is there any chance you are pregnant? When was your last menstrual period?       na 12. TRAVEL: Have you traveled out of the country in the last month? (e.g., travel history, exposures)       na  Protocols used: Cough - Acute Productive-A-AH

## 2024-01-23 NOTE — Telephone Encounter (Unsigned)
 Copied from CRM [id number]. Topic: Clinical - Red Word Triage >> Jan 22, 2024  3:49 PM Robinson H wrote: Red Word that prompted transfer to Nurse Triage: Diagnosed with respiratory virus a week ago was advised to notify provider if she isn't feeling better and patient states she's feeling worse, gotten more congested and traveled to her lungs and has asthma and more phlegm and thicker worried since she has the asthma. >> Jan 23, 2024  1:17 PM Thersia C wrote: Patient called in regarding medication, would like a followup

## 2024-01-24 ENCOUNTER — Encounter: Payer: Self-pay | Admitting: Nurse Practitioner

## 2024-01-24 ENCOUNTER — Ambulatory Visit (INDEPENDENT_AMBULATORY_CARE_PROVIDER_SITE_OTHER): Admitting: Nurse Practitioner

## 2024-01-24 VITALS — BP 120/84 | HR 84 | Temp 98.6°F | Ht 66.0 in | Wt 202.4 lb

## 2024-01-24 DIAGNOSIS — J22 Unspecified acute lower respiratory infection: Secondary | ICD-10-CM

## 2024-01-24 MED ORDER — AMOXICILLIN-POT CLAVULANATE 875-125 MG PO TABS: 1.0000 | ORAL_TABLET | Freq: Two times a day (BID) | ORAL | 0 refills | Status: DC

## 2024-01-24 NOTE — Patient Instructions (Signed)
 It was great to see you!  Start augmentin  twice a day for 10 days   Keep drinking plenty of fluids and getting rest   Let's follow-up if your symptoms worsen or any concerns   Take care,  Tinnie Harada, NP

## 2024-01-24 NOTE — Progress Notes (Signed)
   Acute Office Visit  Subjective:     Patient ID: Monique Rangel, female    DOB: 05/14/1955, 68 y.o.   MRN: [id number]  Chief Complaint  Patient presents with   Cough    With congestion for 1 week    HPI Patient is in today for chest congestion, cough for 10 days  UPPER RESPIRATORY TRACT INFECTION  Fever: no Cough: yes Shortness of breath: yes - intermittent Wheezing: no Chest pain: no Chest tightness: yes Chest congestion: yes Nasal congestion: no Runny nose: no Post nasal drip: yes Sneezing: no Sore throat: yes Swollen glands: no Sinus pressure: no Headache: no Face pain: no Toothache: no Ear pain: no bilateral Ear pressure: no bilateral Eyes red/itching:no Eye drainage/crusting: no  Vomiting: no Rash: no Fatigue: yes Sick contacts: yes Strep contacts: no  Context: worse Recurrent sinusitis: no Relief with OTC cold/cough medications: no  Treatments attempted: Dayquil, nyquil   ROS See pertinent positives and negatives per HPI.     Objective:    BP 120/84 (BP Location: Left Arm, Patient Position: Sitting, Cuff Size: Normal)   Pulse 84   Temp 98.6 F (37 C) (Oral)   Ht 5' 6 (1.676 m)   Wt 202 lb 6.4 oz (91.8 kg)   SpO2 97%   BMI 32.67 kg/m    Physical Exam Vitals and nursing note reviewed.  Constitutional:      General: She is not in acute distress.    Appearance: Normal appearance.  HENT:     Head: Normocephalic.     Right Ear: Tympanic membrane, ear canal and external ear normal.     Left Ear: Ear canal and external ear normal. Tympanic membrane is erythematous.     Mouth/Throat:     Mouth: Mucous membranes are moist.     Pharynx: Posterior oropharyngeal erythema present. No oropharyngeal exudate.  Eyes:     Conjunctiva/sclera: Conjunctivae normal.  Cardiovascular:     Rate and Rhythm: Normal rate and regular rhythm.     Pulses: Normal pulses.     Heart sounds: Normal heart sounds.  Pulmonary:     Effort: Pulmonary effort is  normal.     Breath sounds: Normal breath sounds.  Musculoskeletal:     Cervical back: Normal range of motion.  Skin:    General: Skin is warm.  Neurological:     General: No focal deficit present.     Mental Status: She is alert and oriented to person, place, and time.  Psychiatric:        Mood and Affect: Mood normal.        Behavior: Behavior normal.        Thought Content: Thought content normal.        Judgment: Judgment normal.       Assessment & Plan:   Problem List Items Addressed This Visit   None Visit Diagnoses       Lower respiratory infection    -  Primary   Symptoms x10 days, getting worse. Start augmentin  BID x10 days. Encourage fluids, rest. Can continue OTC meds. F/U if not improving.       Meds ordered this encounter  Medications   amoxicillin -clavulanate (AUGMENTIN ) 875-125 MG tablet    Sig: Take 1 tablet by mouth 2 (two) times daily.    Dispense:  20 tablet    Refill:  0    Return if symptoms worsen or fail to improve.  Tinnie DELENA Harada, NP

## 2024-02-05 ENCOUNTER — Ambulatory Visit: Payer: Self-pay

## 2024-02-05 NOTE — Telephone Encounter (Signed)
 FYI Only or Action Required?: Action required by provider: clinical question for provider.  Patient was last seen in primary care on 01/24/2024 by Nedra Tinnie LABOR, NP.  Called Nurse Triage reporting Pain.  Symptoms began x 3 weeks and ongoing.  Interventions attempted: Prescription medications: muscle relaxer, prednisone .  Symptoms are: unchanged.  Triage Disposition: See PCP When Office is Open (Within 3 Days)  Patient/caregiver understands and will follow disposition?: No, wishes to speak with PCP   Copied from CRM #[id number]. Topic: Clinical - Red Word Triage >> Feb 05, 2024  9:45 AM Deaijah H wrote: Red Word that prompted transfer to Nurse Triage: Follow up on sciatica / still having a lot pain.   ----------------------------------------------------------------------- From previous Reason for Contact - Scheduling: Patient/patient representative is calling to schedule an appointment. Refer to attachments for appointment information. Reason for Disposition  [1] MODERATE pain (e.g., interferes with normal activities, limping) AND [2] present > 3 days  Answer Assessment - Initial Assessment Questions 1. ONSET: When did the pain start?      Ongoing and worsening 2. LOCATION: Where is the pain located?  right Hip and into leg to knee 3. PAIN: How bad is the pain?    (Scale 1-10; or mild, moderate, severe)     3 to 4/10 and up to 8/10 4. WORK OR EXERCISE: Has there been any recent work or exercise that involved this part of the body?      no 5. CAUSE: What do you think is causing the leg pain?     sciatica 6. OTHER SYMPTOMS: Do you have any other symptoms? (e.g., chest pain, back pain, breathing difficulty, swelling, rash, fever, numbness, weakness)     Weakness/pain 7. PREGNANCY: Is there any chance you are pregnant? When was your last menstrual period?     na   Diagnosed with sciatica x 3 weeks ago: pain has gotten a little better but is still there.  Goes  from 3 to 4/10 and 8/10 quickly without warning.  Patient would like referral for ortho, therapy, neuro and/or home exercises to help.  Please reach back out to patient to update on plan of care.  Protocols used: Leg Pain-A-AH

## 2024-02-06 ENCOUNTER — Other Ambulatory Visit: Payer: Self-pay | Admitting: Family Medicine

## 2024-02-07 ENCOUNTER — Ambulatory Visit: Admitting: Family

## 2024-02-07 VITALS — BP 138/67 | HR 69 | Temp 98.6°F | Resp 16 | Ht 66.0 in | Wt 202.2 lb

## 2024-02-07 DIAGNOSIS — M5431 Sciatica, right side: Secondary | ICD-10-CM | POA: Insufficient documentation

## 2024-02-07 MED ORDER — MELOXICAM 7.5 MG PO TABS: 7.5000 mg | ORAL_TABLET | Freq: Every day | ORAL | 0 refills | Status: DC

## 2024-02-07 NOTE — Patient Instructions (Signed)
  VISIT SUMMARY: Today, you were seen for persistent right leg pain and limited mobility. Your pain has improved somewhat with previous treatments but remains significant. We discussed your symptoms, including the pain radiating from your gluteal region to the back of your knee and occasionally into your groin. You also mentioned a brief episode of tingling in your foot. We reviewed your current medications and concerns about their side effects.  YOUR PLAN: -RIGHT-SIDED SCIATICA: Sciatica is pain that radiates along the path of the sciatic nerve, which runs from your lower back through your hips and buttocks and down each leg. We will start you on meloxicam  once daily for one week to see if it helps with your pain. Continue taking Tylenol  at 1000 mg twice daily, but do not exceed 4000 mg per day. You are also referred to [facility] to help manage your symptoms. If there is no improvement after physical therapy, follow up with Dr. Tess for a potential MRI and further evaluation.  INSTRUCTIONS: Please follow up with Dr. Tess after completing physical therapy if there is no improvement in your symptoms. Continue taking your medications as prescribed and attend your physical therapy sessions at [facility].

## 2024-02-07 NOTE — Assessment & Plan Note (Signed)
  Chronic right-sided sciatica with significant pain despite initial improvement from Toradol , steroids, muscle relaxers, and Tylenol . Conservative management required before MRI consideration for insurance purposes. Discussed meloxicam  as an alternative to Advil, highlighting gastrointestinal and renal risks with long term use of NSAIDS.  - Prescribed meloxicam  once daily for one week, then once daily prn. - Advised Tylenol  1000 mg twice daily - Referred to [facility]. - Advised follow-up with Dr. Berneta post-physical therapy if no improvement for potential MRI and further evaluation.

## 2024-02-07 NOTE — Progress Notes (Signed)
 Subjective:     Patient ID: Monique Rangel, female    DOB: [birth year], 68 y.o.   MRN: [id number]  Chief Complaint  Patient presents with   Sciatica    Here for follow up, still hurting after treatment    HPI  Discussed the use of AI scribe software for clinical note transcription with the patient, who gave verbal consent to proceed.  History of Present Illness Monique Rangel is a 68 year old female who presents with persistent right leg pain and limited mobility.  She experiences severe pain in her right leg, initially rated as 9 to 10 out of 10 in intensity. The pain has improved somewhat following a Toradol  injection, a steroid pack, and the use of a muscle relaxer and Tylenol , but it remains significant enough to limit her mobility. She can walk for about half a day before needing to sit or lie down due to the pain.  The pain primarily radiates from the gluteal region to the back of the knee, without extending into the calf. Occasionally, the pain radiates into the groin area, which she describes as feeling like it could 'chop me in half.' She also experienced light tingling in her foot this morning, which resolved spontaneously. No bowel or bladder incontinence, and no weakness or numbness in her legs, aside from the tingling.  She has been using Voltaren  gel and reports some relief, but is concerned about the amount of Tylenol  and Advil she is taking, fearing potential harm to her liver and stomach. She is currently taking less than 4000 mg of Tylenol  per day and is mindful of her intake. She has a sensitive stomach and ensures she eats before taking medications.  Her past experience with physical therapy for neck issues has been positive, reducing the frequency of her symptoms, although not eliminating them entirely. She lives in [place] and has previously attended physical therapy at a location near her home, which she finds convenient.      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   HERNIA REPAIR  1992    Family History  Problem Relation Age of Onset   Hearing loss Mother    Miscarriages / Stillbirths Mother    Cancer Father    Diabetes Father    Stroke Father    Hearing loss Sister    Arthritis Maternal Aunt    Vision loss Maternal Aunt    Early death Maternal Grandfather    Early death Maternal Grandmother    Drug abuse Paternal Grandfather    Hearing loss Paternal Grandfather    Heart disease Paternal Grandfather    Cancer Paternal Grandmother    Early death Paternal Grandmother    Varicose Veins Paternal Grandmother    Depression Maternal Aunt    Breast cancer Neg Hx     Social History   Socioeconomic History   Marital status: Married    Spouse name: Not on file   Number of children: 2   Years of  education: Not on file   Highest education level: Master's degree (e.g., MA, MS, MEng, MEd, MSW, MBA)  Occupational History   Not on file  Tobacco Use   Smoking status: Never    Passive exposure: Past   Smokeless tobacco: Never  Vaping Use   Vaping status: Never Used  Substance and Sexual Activity   Alcohol use: Yes    Alcohol/week: 2.0 standard drinks of alcohol    Types: 2 Glasses of wine per week    Comment: usually with a meal   Drug use: Never   Sexual activity: Not Currently    Birth control/protection: Post-menopausal  Other Topics Concern   Not on file  Social History Narrative   Not on file   Social Drivers of Health   Financial Resource Strain: Low Risk  (02/07/2024)   Overall Financial Resource Strain (CARDIA)    Difficulty of  Paying Living Expenses: Not hard at Rangel  Food Insecurity: No Food Insecurity (02/07/2024)   Hunger Vital Sign    Worried About Running Out of Food in the Last Year: Never true    Ran Out of Food in the Last Year: Never true  Transportation Needs: No Transportation Needs (02/07/2024)   PRAPARE - Administrator, Civil Service (Medical): No    Lack of Transportation (Non-Medical): No  Physical Activity: Insufficiently Active (02/07/2024)   Exercise Vital Sign    Days of Exercise per Week: 3 days    Minutes of Exercise per Session: 20 min  Stress: Stress Concern Present (02/07/2024)   [employer] of Occupational Health - Occupational Stress Questionnaire    Feeling of Stress: To some extent  Social Connections: Socially Integrated (02/07/2024)   Social Connection and Isolation Panel    Frequency of Communication with Friends and Family: More than three times a week    Frequency of Social Gatherings with Friends and Family: Three times a week    Attends Religious Services: More than 4 times per year    Active Member of Clubs or Organizations: Yes    Attends Banker Meetings: More than 4 times per year    Marital Status: Married  Catering Manager Violence: Not At Risk (04/03/2023)   Humiliation, Afraid, Rape, and Kick questionnaire    Fear of Current or Ex-Partner: No    Emotionally Abused: No    Physically Abused: No    Sexually Abused: No    Outpatient Medications Prior to Visit  Medication Sig Dispense Refill   acetaminophen  (TYLENOL  8 HOUR) 650 MG CR tablet Take 1 tablet (650 mg total) by mouth every 8 (eight) hours as needed for pain.     albuterol  (VENTOLIN  HFA) 108 (90 Base) MCG/ACT inhaler Inhale 1-2 puffs into the lungs every 6 (six) hours as needed for wheezing or shortness of breath. 8 g 0   Biotin (BIOTIN MAXIMUM STRENGTH) 10 MG TABS Take by mouth.     Cholecalciferol (VITAMIN D3) 50 MCG (2000 UT) TABS Take 2 tablets by mouth daily.      diclofenac  Sodium (VOLTAREN ) 1 % GEL Apply a small grape sized amount to right knee every 6 hours as needed. 150 g 1   diphenhydrAMINE (BENADRYL) 25 MG tablet Take 25 mg by mouth at bedtime as needed.     DULoxetine  (CYMBALTA ) 20 MG capsule TAKE 2 CAPSULES BY MOUTH DAILY 180 capsule 1   fluticasone (FLONASE) 50 MCG/ACT nasal spray Place 2 sprays into both nostrils every morning.     gabapentin  (  NEURONTIN ) 100 MG capsule TAKE 2 CAPSULES BY MOUTH AT BEDTIME 180 capsule 0   Multiple Vitamin (MULTI-VITAMIN) tablet Take by mouth.     Multiple Vitamins-Minerals (PRESERVISION AREDS PO) Take by mouth.     omeprazole  (PRILOSEC) 40 MG capsule TAKE 1 CAPSULE BY MOUTH DAILY 90 capsule 1   Omeprazole  Magnesium (PRILOSEC PO)      tiZANidine  (ZANAFLEX ) 4 MG tablet Take 1 tablet (4 mg total) by mouth every 8 (eight) hours as needed for muscle spasms. 21 tablet 0   amoxicillin -clavulanate (AUGMENTIN ) 875-125 MG tablet Take 1 tablet by mouth 2 (two) times daily. 20 tablet 0   predniSONE  (STERAPRED UNI-PAK 21 TAB) 10 MG (21) TBPK tablet As directed on package 21 tablet 0   TART CHERRY PO Take 1 tablet by mouth daily.     No facility-administered medications prior to visit.    Allergies  Allergen Reactions   Keflex  [Cephalexin ] Other (See Comments)    Abdominal pain and Nausea   Lamisil  [Terbinafine ] Rash   Quinine Other (See Comments)    Mother had a bad reaction so does not want to use it Mother had a bad reaction so does not want to use it     ROS See HPI    Objective:    Physical Exam Constitutional:      General: She is not in acute distress.    Appearance: Normal appearance. She is well-developed.  HENT:     Head: Normocephalic and atraumatic.     Right Ear: External ear normal.     Left Ear: External ear normal.  Eyes:     General: No scleral icterus. Neck:     Thyroid : No thyromegaly.  Cardiovascular:     Rate and Rhythm: Normal rate and regular rhythm.     Heart sounds: Normal  heart sounds. No murmur heard. Pulmonary:     Effort: Pulmonary effort is normal. No respiratory distress.     Breath sounds: Normal breath sounds. No wheezing.  Musculoskeletal:     Cervical back: Neck supple.  Skin:    General: Skin is warm and dry.  Neurological:     Mental Status: She is alert and oriented to person, place, and time.     Deep Tendon Reflexes:     Reflex Scores:      Patellar reflexes are 2+ on the right side and 2+ on the left side.    Comments: Bilateral LE strength is 5/5  Psychiatric:        Mood and Affect: Mood normal.        Behavior: Behavior normal.        Thought Content: Thought content normal.        Judgment: Judgment normal.      BP 138/67 (BP Location: Right Arm, Patient Position: Sitting, Cuff Size: Large)   Pulse 69   Temp 98.6 F (37 C) (Oral)   Resp 16   Ht 5' 6 (1.676 m)   Wt 202 lb 3.2 oz (91.7 kg)   SpO2 96%   BMI 32.64 kg/m  Wt Readings from Last 3 Encounters:  02/07/24 202 lb 3.2 oz (91.7 kg)  01/24/24 202 lb 6.4 oz (91.8 kg)  01/19/24 202 lb 3.2 oz (91.7 kg)       Assessment & Plan:   Problem List Items Addressed This Visit       Unprioritized   Right sided sciatica - Primary    Chronic right-sided sciatica with significant pain despite initial improvement  from Toradol , steroids, muscle relaxers, and Tylenol . Conservative management required before MRI consideration for insurance purposes. Discussed meloxicam  as an alternative to Advil, highlighting gastrointestinal and renal risks with long term use of NSAIDS.  - Prescribed meloxicam  once daily for one week, then once daily prn. - Advised Tylenol  1000 mg twice daily - Referred to [facility]. - Advised follow-up with Dr. Berneta post-physical therapy if no improvement for potential MRI and further evaluation.       Relevant Medications   meloxicam  (MOBIC ) 7.5 MG tablet   Other Relevant Orders   Ambulatory referral to Physical Therapy    Assessment & Plan     I have discontinued Jasper W. Pendergraph's TART CHERRY PO and predniSONE . I am also having her start on meloxicam . Additionally, I am having her maintain her fluticasone, diphenhydrAMINE, diclofenac  Sodium, Multi-Vitamin, Biotin, Multiple Vitamins-Minerals (PRESERVISION AREDS PO), Vitamin D3, Omeprazole  Magnesium (PRILOSEC PO), albuterol , omeprazole , gabapentin , DULoxetine , acetaminophen , tiZANidine , and amoxicillin -clavulanate.  Meds ordered this encounter  Medications   meloxicam  (MOBIC ) 7.5 MG tablet    Sig: Take 1 tablet (7.5 mg total) by mouth daily.    Dispense:  30 tablet    Refill:  0    Supervising Provider:   DOMENICA BLACKBIRD A [[id number]]

## 2024-02-12 ENCOUNTER — Other Ambulatory Visit: Payer: Self-pay | Admitting: Family Medicine

## 2024-02-12 DIAGNOSIS — B001 Herpesviral vesicular dermatitis: Secondary | ICD-10-CM

## 2024-03-08 ENCOUNTER — Ambulatory Visit (INDEPENDENT_AMBULATORY_CARE_PROVIDER_SITE_OTHER): Admitting: Family Medicine

## 2024-03-08 ENCOUNTER — Ambulatory Visit: Admitting: Family Medicine

## 2024-03-08 ENCOUNTER — Encounter: Payer: Self-pay | Admitting: Family Medicine

## 2024-03-08 VITALS — BP 135/76 | HR 75 | Temp 98.3°F | Ht 66.0 in | Wt 202.4 lb

## 2024-03-08 DIAGNOSIS — M5431 Sciatica, right side: Secondary | ICD-10-CM | POA: Diagnosis not present

## 2024-03-08 MED ORDER — MELOXICAM 7.5 MG PO TABS: 7.5000 mg | ORAL_TABLET | Freq: Every day | ORAL | 1 refills | Status: AC

## 2024-03-08 MED ORDER — GABAPENTIN 100 MG PO CAPS: ORAL_CAPSULE | ORAL | 3 refills | Status: AC

## 2024-03-08 NOTE — Progress Notes (Signed)
 "  Established Patient Office Visit   Subjective:  Patient ID: Monique Rangel, female    DOB: 1955-05-08  Age: 68 y.o. MRN: [id number]  Chief Complaint  Patient presents with   Medical Management of Chronic Issues    Pt presents today for Sciatic pain. Pt states it been going on since early November. States she is learning how to manage it she is currently pain free at the moment but last night she had to take 3 muscle relaxer. Pt starts PT on Jan 9th.     HPI Encounter Diagnoses  Name Primary?   Right sided sciatica Yes   Ongoing issue with pain radiating down the backside of her right leg into her calf since the first part of November.  To a lesser extent the left leg is affected similarly as well.  She has been treated with steroids, muscle relaxants and more recently meloxicam .  Meloxicam  was started at the end of November and has been quite helpful.  More recently she has been having some pain at night that has not responded to the muscle relaxers.  She is to start physical therapy on the 29th.  She denies saddle paresthesias, bowel or bladder incontinence.   Review of Systems  Constitutional: Negative.   HENT: Negative.    Eyes:  Negative for blurred vision, discharge and redness.  Respiratory: Negative.    Cardiovascular: Negative.   Gastrointestinal:  Negative for abdominal pain.  Genitourinary: Negative.   Musculoskeletal:  Positive for back pain. Negative for myalgias.  Skin:  Negative for rash.  Neurological:  Negative for tingling, loss of consciousness and weakness.  Endo/Heme/Allergies:  Negative for polydipsia.    Current Medications[1]   Objective:     BP 135/76   Pulse 75   Temp 98.3 F (36.8 C)   Ht 5' 6 (1.676 m)   Wt 202 lb 6.4 oz (91.8 kg)   SpO2 96%   BMI 32.67 kg/m    Physical Exam Constitutional:      General: She is not in acute distress.    Appearance: Normal appearance. She is not ill-appearing, toxic-appearing or diaphoretic.  HENT:      Head: Normocephalic and atraumatic.     Right Ear: External ear normal.     Left Ear: External ear normal.  Eyes:     General: No scleral icterus.       Right eye: No discharge.        Left eye: No discharge.     Extraocular Movements: Extraocular movements intact.     Conjunctiva/sclera: Conjunctivae normal.  Pulmonary:     Effort: Pulmonary effort is normal. No respiratory distress.  Musculoskeletal:     Lumbar back: No bony tenderness. Normal range of motion. Negative right straight leg raise test and negative left straight leg raise test.     Comments: Negative sciatic notch tenderness to palpation.  Skin:    General: Skin is warm and dry.  Neurological:     Mental Status: She is alert and oriented to person, place, and time.     Motor: No weakness.     Deep Tendon Reflexes:     Reflex Scores:      Patellar reflexes are 2+ on the right side and 2+ on the left side.      Achilles reflexes are 1+ on the right side and 1+ on the left side. Psychiatric:        Mood and Affect: Mood normal.  Behavior: Behavior normal.      No results found for any visits on 03/08/24.    The ASCVD Risk score (Arnett DK, et al., 2019) failed to calculate for the following reasons:   Risk score cannot be calculated because patient has a medical history suggesting prior/existing ASCVD   * - Cholesterol units were assumed    Assessment & Plan:   Right sided sciatica -     Ambulatory referral to Sports Medicine -     MR LUMBAR SPINE WO CONTRAST; Future -     Meloxicam ; Take 1 tablet (7.5 mg total) by mouth daily.  Dispense: 30 tablet; Refill: 1  Other orders -     Gabapentin ; May take 1 to 2 tablets at night as needed for pain  Dispense: 60 capsule; Refill: 3    Return if symptoms worsen or fail to improve.  Continue meloxicam  and tizanidine  as needed.  Start Neurontin  as directed.  Start physical therapy as planned.  Referral onto sports medicine and MRI due to the length and  ongoing symptoms.  Elsie Sim Lent, MD    [1]  Current Outpatient Medications:    acetaminophen  (TYLENOL  8 HOUR) 650 MG CR tablet, Take 1 tablet (650 mg total) by mouth every 8 (eight) hours as needed for pain., Disp: , Rfl:    albuterol  (VENTOLIN  HFA) 108 (90 Base) MCG/ACT inhaler, Inhale 1-2 puffs into the lungs every 6 (six) hours as needed for wheezing or shortness of breath., Disp: 8 g, Rfl: 0   Biotin (BIOTIN MAXIMUM STRENGTH) 10 MG TABS, Take by mouth., Disp: , Rfl:    Cholecalciferol (VITAMIN D3) 50 MCG (2000 UT) TABS, Take 2 tablets by mouth daily., Disp: , Rfl:    diphenhydrAMINE (BENADRYL) 25 MG tablet, Take 25 mg by mouth at bedtime as needed., Disp: , Rfl:    DULoxetine  (CYMBALTA ) 20 MG capsule, TAKE 2 CAPSULES BY MOUTH DAILY, Disp: 180 capsule, Rfl: 1   fluticasone (FLONASE) 50 MCG/ACT nasal spray, Place 2 sprays into both nostrils every morning., Disp: , Rfl:    gabapentin  (NEURONTIN ) 100 MG capsule, May take 1 to 2 tablets at night as needed for pain, Disp: 60 capsule, Rfl: 3   Multiple Vitamin (MULTI-VITAMIN) tablet, Take by mouth., Disp: , Rfl:    Multiple Vitamins-Minerals (PRESERVISION AREDS PO), Take by mouth., Disp: , Rfl:    omeprazole  (PRILOSEC) 40 MG capsule, TAKE 1 CAPSULE BY MOUTH DAILY, Disp: 90 capsule, Rfl: 1   Omeprazole  Magnesium (PRILOSEC PO), , Disp: , Rfl:    tiZANidine  (ZANAFLEX ) 4 MG tablet, Take 1 tablet (4 mg total) by mouth every 8 (eight) hours as needed for muscle spasms., Disp: 21 tablet, Rfl: 0   valACYclovir  (VALTREX ) 1000 MG tablet, TAKE 1/2 TABLET BY MOUTH TWICE A DAY FOR 3 DAYS AS NEEDED, Disp: 10 tablet, Rfl: 2   amoxicillin -clavulanate (AUGMENTIN ) 875-125 MG tablet, Take 1 tablet by mouth 2 (two) times daily. (Patient not taking: Reported on 03/08/2024), Disp: 20 tablet, Rfl: 0   meloxicam  (MOBIC ) 7.5 MG tablet, Take 1 tablet (7.5 mg total) by mouth daily., Disp: 30 tablet, Rfl: 1  "

## 2024-03-11 ENCOUNTER — Ambulatory Visit: Admitting: Physical Therapy

## 2024-03-11 ENCOUNTER — Ambulatory Visit: Admitting: Sports Medicine

## 2024-03-11 ENCOUNTER — Encounter: Payer: Self-pay | Admitting: Sports Medicine

## 2024-03-11 ENCOUNTER — Ambulatory Visit: Payer: Self-pay

## 2024-03-11 VITALS — BP 121/82 | HR 92 | Temp 98.2°F | Wt 202.8 lb

## 2024-03-11 DIAGNOSIS — R6889 Other general symptoms and signs: Secondary | ICD-10-CM | POA: Diagnosis not present

## 2024-03-11 DIAGNOSIS — J4521 Mild intermittent asthma with (acute) exacerbation: Secondary | ICD-10-CM | POA: Diagnosis not present

## 2024-03-11 DIAGNOSIS — R051 Acute cough: Secondary | ICD-10-CM

## 2024-03-11 LAB — POCT RAPID STREP A (OFFICE): Rapid Strep A Screen: NEGATIVE

## 2024-03-11 LAB — POC COVID19 BINAXNOW: SARS Coronavirus 2 Ag: NEGATIVE

## 2024-03-11 LAB — POCT INFLUENZA A/B
Influenza A, POC: NEGATIVE
Influenza B, POC: NEGATIVE

## 2024-03-11 MED ORDER — PREDNISONE 20 MG PO TABS: 40.0000 mg | ORAL_TABLET | Freq: Every day | ORAL | 0 refills | Status: AC

## 2024-03-11 MED ORDER — BENZONATATE 200 MG PO CAPS: 200.0000 mg | ORAL_CAPSULE | Freq: Two times a day (BID) | ORAL | 0 refills | Status: AC | PRN

## 2024-03-11 MED ORDER — ALBUTEROL SULFATE HFA 108 (90 BASE) MCG/ACT IN AERS: 2.0000 | INHALATION_SPRAY | Freq: Four times a day (QID) | RESPIRATORY_TRACT | 0 refills | Status: AC | PRN

## 2024-03-11 NOTE — Progress Notes (Unsigned)
 "  New Patient Office Visit  Patient ID: Monique Rangel, Female   DOB: [birth year] 68 y.o. MRN: [id number] Subjective:     Discussed the use of AI scribe software for clinical note transcription with the patient, who gave verbal consent to proceed.  History of Present Illness  Monique Rangel is a 68 year old female with mild asthma who presents with respiratory symptoms and wheezing.  She has been experiencing respiratory symptoms since Saturday, including wheezing that woke her up. Her asthma is usually well-controlled, requiring albuterol  only once or twice a year, but she was unable to locate her inhaler during this episode. On Sunday, she developed a low-grade fever and extreme fatigue, describing herself as 'very tired' and only able to sleep. She has nasal congestion and postnasal drainage, especially when lying down, but no significant rhinorrhea. She has been using Nyquil to manage these symptoms.  She experiences a sore throat upon waking, which resolves shortly after. No ear pain, discharge, or tinnitus, although she occasionally experiences tinnitus unrelated to this illness. She has a cough producing mostly clear phlegm, with occasional thicker, yellow sputum. Her breathing worsened last night, prompting her visit.  No chest pain or palpitations ,No urinary symptoms, abdominal pain, nausea, or diarrhea, though she had some diarrhea before her current symptoms. Her appetite was initially poor but has since improved.  Her mother, who lives with her, is also sick with similar symptoms but does not have asthma. She reports worsening sciatica over the past few days. She is taking meloxicam  and has recently restarted gabapentin  after a month-long break, noting her sciatica seemed worse after resuming it. She takes Zanaflex  as needed but has not used it in several days.  No history of smoking or COPD.   Outpatient Encounter Medications as of 03/11/2024  Medication Sig   acetaminophen   (TYLENOL  8 HOUR) 650 MG CR tablet Take 1 tablet (650 mg total) by mouth every 8 (eight) hours as needed for pain.   albuterol  (VENTOLIN  HFA) 108 (90 Base) MCG/ACT inhaler Inhale 1-2 puffs into the lungs every 6 (six) hours as needed for wheezing or shortness of breath.   albuterol  (VENTOLIN  HFA) 108 (90 Base) MCG/ACT inhaler Inhale 2 puffs into the lungs every 6 (six) hours as needed for wheezing or shortness of breath.   benzonatate (TESSALON) 200 MG capsule Take 1 capsule (200 mg total) by mouth 2 (two) times daily as needed for cough.   Biotin (BIOTIN MAXIMUM STRENGTH) 10 MG TABS Take by mouth.   Cholecalciferol (VITAMIN D3) 50 MCG (2000 UT) TABS Take 2 tablets by mouth daily.   diphenhydrAMINE (BENADRYL) 25 MG tablet Take 25 mg by mouth at bedtime as needed.   DULoxetine  (CYMBALTA ) 20 MG capsule TAKE 2 CAPSULES BY MOUTH DAILY   fluticasone (FLONASE) 50 MCG/ACT nasal spray Place 2 sprays into both nostrils every morning.   gabapentin  (NEURONTIN ) 100 MG capsule May take 1 to 2 tablets at night as needed for pain   meloxicam  (MOBIC ) 7.5 MG tablet Take 1 tablet (7.5 mg total) by mouth daily.   Multiple Vitamin (MULTI-VITAMIN) tablet Take by mouth.   Multiple Vitamins-Minerals (PRESERVISION AREDS PO) Take by mouth.   omeprazole  (PRILOSEC) 40 MG capsule TAKE 1 CAPSULE BY MOUTH DAILY   predniSONE  (DELTASONE ) 20 MG tablet Take 2 tablets (40 mg total) by mouth daily with breakfast for 5 days.   tiZANidine  (ZANAFLEX ) 4 MG tablet Take 1 tablet (4 mg total) by mouth every 8 (eight) hours as  needed for muscle spasms.   valACYclovir  (VALTREX ) 1000 MG tablet TAKE 1/2 TABLET BY MOUTH TWICE A DAY FOR 3 DAYS AS NEEDED   [DISCONTINUED] amoxicillin -clavulanate (AUGMENTIN ) 875-125 MG tablet Take 1 tablet by mouth 2 (two) times daily.   [DISCONTINUED] Omeprazole  Magnesium (PRILOSEC PO)    No facility-administered encounter medications on file as of 03/11/2024.    Past Medical History:  Diagnosis Date    Allergy    Anxiety 05/11/2016   Arthritis    Asthma    Cataract    Depression    GERD (gastroesophageal reflux disease)    Myocardial infarction [name])     Past Surgical History:  Procedure Laterality Date   EYE SURGERY  both cataracts   FINGER SURGERY Right    benign tumor on right middle finger   HERNIA REPAIR  1992    Family History  Problem Relation Age of Onset   Hearing loss Mother    Miscarriages / Stillbirths Mother    Cancer Father    Diabetes Father    Stroke Father    Hearing loss Sister    Arthritis Maternal Aunt    Vision loss Maternal Aunt    Early death Maternal Grandfather    Early death Maternal Grandmother    Drug abuse Paternal Grandfather    Hearing loss Paternal Grandfather    Heart disease Paternal Grandfather    Cancer Paternal Grandmother    Early death Paternal Grandmother    Varicose Veins Paternal Grandmother    Depression Maternal Aunt    Breast cancer Neg Hx     Social History   Socioeconomic History   Marital status: Married    Spouse name: Not on file   Number of children: 2   Years of education: Not on file   Highest education level: Master's degree (e.g., MA, MS, MEng, MEd, MSW, MBA)  Occupational History   Not on file  Tobacco Use   Smoking status: Never    Passive exposure: Past   Smokeless tobacco: Never  Vaping Use   Vaping status: Never Used  Substance and Sexual Activity   Alcohol use: Yes    Alcohol/week: 2.0 standard drinks of alcohol    Types: 2 Glasses of wine per week    Comment: usually with a meal   Drug use: Never   Sexual activity: Not Currently    Birth control/protection: Post-menopausal  Other Topics Concern   Not on file  Social History Narrative   Not on file   Social Drivers of Health   Tobacco Use: Low Risk (03/11/2024)   Patient History    Smoking Tobacco Use: Never    Smokeless Tobacco Use: Never    Passive Exposure: Past  Financial Resource Strain: Low Risk (02/07/2024)   Overall  Financial Resource Strain (CARDIA)    Difficulty of Paying Living Expenses: Not hard at all  Food Insecurity: No Food Insecurity (02/07/2024)   Epic    Worried About Programme Researcher, Broadcasting/film/video in the Last Year: Never true    Ran Out of Food in the Last Year: Never true  Transportation Needs: No Transportation Needs (02/07/2024)   Epic    Lack of Transportation (Medical): No    Lack of Transportation (Non-Medical): No  Physical Activity: Insufficiently Active (02/07/2024)   Exercise Vital Sign    Days of Exercise per Week: 3 days    Minutes of Exercise per Session: 20 min  Stress: Stress Concern Present (02/07/2024)   [employer] of Occupational Health -  Occupational Stress Questionnaire    Feeling of Stress: To some extent  Social Connections: Socially Integrated (02/07/2024)   Social Connection and Isolation Panel    Frequency of Communication with Friends and Family: More than three times a week    Frequency of Social Gatherings with Friends and Family: Three times a week    Attends Religious Services: More than 4 times per year    Active Member of Clubs or Organizations: Yes    Attends Banker Meetings: More than 4 times per year    Marital Status: Married  Catering Manager Violence: Not At Risk (04/03/2023)   Humiliation, Afraid, Rape, and Kick questionnaire    Fear of Current or Ex-Partner: No    Emotionally Abused: No    Physically Abused: No    Sexually Abused: No  Depression (PHQ2-9): Low Risk (03/08/2024)   Depression (PHQ2-9)    PHQ-2 Score: 0  Recent Concern: Depression (PHQ2-9) - Medium Risk (02/07/2024)   Depression (PHQ2-9)    PHQ-2 Score: 5  Alcohol Screen: Low Risk (02/07/2024)   Alcohol Screen    Last Alcohol Screening Score (AUDIT): 2  Housing: Low Risk (02/07/2024)   Epic    Unable to Pay for Housing in the Last Year: No    Number of Times Moved in the Last Year: 0    Homeless in the Last Year: No  Utilities: Not At Risk (04/03/2023)   AHC  Utilities    Threatened with loss of utilities: No  Health Literacy: Adequate Health Literacy (04/03/2023)   B1300 Health Literacy    Frequency of need for help with medical instructions: Never    Review of Systems  Constitutional:  Positive for fever and malaise/fatigue. Negative for chills.  HENT:  Positive for sinus pain. Negative for ear discharge, ear pain, sore throat and tinnitus.   Eyes:  Negative for blurred vision.  Respiratory:  Positive for cough, sputum production and wheezing.   Cardiovascular:  Negative for chest pain, palpitations and leg swelling.  Gastrointestinal:  Negative for abdominal pain, diarrhea and vomiting.  Genitourinary:  Negative for dysuria and frequency.  Musculoskeletal:  Positive for myalgias.  Neurological:  Negative for dizziness.     Objective:    BP 121/82   Pulse 92   Temp 98.2 F (36.8 C)   Wt 202 lb 12.8 oz (92 kg)   SpO2 96%   BMI 32.73 kg/m   Physical Exam Constitutional:      Appearance: Normal appearance.  HENT:     Head: Normocephalic and atraumatic.     Right Ear: Tympanic membrane normal.     Left Ear: Tympanic membrane normal.     Mouth/Throat:     Pharynx: Posterior oropharyngeal erythema present. No oropharyngeal exudate.  Cardiovascular:     Rate and Rhythm: Normal rate and regular rhythm.  Pulmonary:     Effort: Pulmonary effort is normal. No respiratory distress.     Breath sounds: Wheezing present.  Abdominal:     General: Bowel sounds are normal. There is no distension.     Tenderness: There is no abdominal tenderness. There is no guarding or rebound.     Comments:    Musculoskeletal:        General: No swelling or tenderness.  Lymphadenopathy:     Cervical: No cervical adenopathy.  Neurological:     Mental Status: She is alert. Mental status is at baseline.     Sensory: No sensory deficit.     Motor: No  weakness.     Last CBC Lab Results  Component Value Date   WBC 7.2 10/10/2023   HGB 13.6  10/10/2023   HCT 40.9 10/10/2023   MCV 85.8 10/10/2023   MCH 29.2 05/21/2021   RDW 13.6 10/10/2023   PLT 276.0 10/10/2023   Last metabolic panel Lab Results  Component Value Date   GLUCOSE 109 (H) 10/10/2023   NA 142 10/10/2023   K 4.0 10/10/2023   CL 104 10/10/2023   CO2 29 10/10/2023   BUN 15 10/10/2023   CREATININE 0.80 10/10/2023   GFR 75.61 10/10/2023   CALCIUM 9.5 10/10/2023   PROT 7.0 10/10/2023   ALBUMIN 4.4 10/10/2023   BILITOT 1.0 10/10/2023   ALKPHOS 60 10/10/2023   AST 18 10/10/2023   ALT 14 10/10/2023   Last lipids Lab Results  Component Value Date   CHOL 206 (H) 10/10/2023   HDL 68.20 10/10/2023   LDLCALC 115 (H) 10/10/2023   TRIG 117.0 10/10/2023   CHOLHDL 3 10/10/2023   Last hemoglobin A1c Lab Results  Component Value Date   HGBA1C 5.9 10/10/2023   Last thyroid  functions Lab Results  Component Value Date   TSH 1.69 04/14/2020   Last vitamin D  Lab Results  Component Value Date   VD25OH 32.49 10/10/2023   Last vitamin B12 and Folate Lab Results  Component Value Date   VITAMINB12 548 10/10/2023       Assessment & Plan:   Assessment & Plan Flu-like symptoms Flu/covid  strep - neg Instructed patient to take claritin for post nasal drip Orders:   POCT rapid strep A   POCT Influenza A/B   POC COVID-19 BinaxNow  Acute cough Afebrile Vitals stable Take tessalon  Orders:   benzonatate (TESSALON) 200 MG capsule; Take 1 capsule (200 mg total) by mouth 2 (two) times daily as needed for cough.  Mild intermittent asthma with acute exacerbation Pt c/o intermittent wheezing Will send prednisone  to her pharmacy Refilled albuterol   Orders:   predniSONE  (DELTASONE ) 20 MG tablet; Take 2 tablets (40 mg total) by mouth daily with breakfast for 5 days.   albuterol  (VENTOLIN  HFA) 108 (90 Base) MCG/ACT inhaler; Inhale 2 puffs into the lungs every 6 (six) hours as needed for wheezing or shortness of breath.   No follow-ups on file.    Jackalyn Blazing, MD [facility]    "

## 2024-03-11 NOTE — Telephone Encounter (Signed)
 FYI Only or Action Required?: FYI only for provider: appointment scheduled on 12/29 .  Patient was last seen in primary care on 03/08/2024 by [name], MD.  Called Nurse Triage reporting Wheezing.  Symptoms began yesterday.  Interventions attempted: Nothing.  Symptoms are: stable.  Triage Disposition: See HCP Within 4 Hours (Or PCP Triage)  Patient/caregiver understands and will follow disposition?: Yes     Copied from CRM #[id number]. Topic: Clinical - Red Word Triage >> Mar 11, 2024 12:05 PM Mercedes MATSU wrote: Red Word that prompted transfer to Nurse Triage: Patient called in stating that she is sick. She is wheezing, and said that she could not sleep last night due to the constant wheezing. She also has asthma and can not locate her inhaler. Is requesting same day or next day appt open to traveling to other clinics, does not want to go to ER. Reason for Disposition  [1] MILD difficulty breathing (e.g., minimal/no SOB at rest, SOB with walking, pulse < 100) AND [2] still present when not coughing  Answer Assessment - Initial Assessment Questions 1. ONSET: When did the cough begin?      Yesterday 2. SEVERITY: How bad is the cough today?      mild 3. SPUTUM: Describe the color of your sputum (e.g., none, dry cough; clear, white, yellow, green)     white 4. HEMOPTYSIS: Are you coughing up any blood? If Yes, ask: How much? (e.g., flecks, streaks, tablespoons, etc.)     no 5. DIFFICULTY BREATHING: Are you having difficulty breathing? If Yes, ask: How bad is it? (e.g., mild, moderate, severe)      denies 6. FEVER: Do you have a fever? If Yes, ask: What is your temperature, how was it measured, and when did it start?     no 7. CARDIAC HISTORY: Do you have any history of heart disease? (e.g., heart attack, congestive heart failure)       8. LUNG HISTORY: Do you have any history of lung disease?  (e.g., pulmonary embolus, asthma, emphysema)     Asthma,  can't find inhaler 9. PE RISK FACTORS: Do you have a history of blood clots? (or: recent major surgery, recent prolonged travel, bedridden)      10. OTHER SYMPTOMS: Do you have any other symptoms? (e.g., runny nose, wheezing, chest pain)       Wheezing last night, no CP  Protocols used: Cough - Acute Productive-A-AH

## 2024-03-27 ENCOUNTER — Other Ambulatory Visit

## 2024-04-05 ENCOUNTER — Ambulatory Visit: Payer: PPO

## 2024-04-08 ENCOUNTER — Ambulatory Visit

## 2024-04-12 ENCOUNTER — Other Ambulatory Visit: Payer: Self-pay | Admitting: Family Medicine

## 2024-04-12 DIAGNOSIS — K219 Gastro-esophageal reflux disease without esophagitis: Secondary | ICD-10-CM

## 2024-04-12 NOTE — Progress Notes (Unsigned)
 " Monique Rangel Subjective:   ISusannah Rangel, am serving as a scribe for Dr. Arthea Claudene.  I'm seeing this patient by the request  of:  [name], MD  CC: Low back pain but also neck pain  YEP:Dlagzrupcz  12/27/2022 Patient does have a positive ANA and concern for potential underlying rheumatoid arthritis.  Does have an aunt with it.  Awaiting the referral and will be seeing rheumatology in February.  No sooner appointments available.  Discussed with patient that she would like to continue with the conservative therapy and the vitamin supplementations.  We will continue to monitor closely.  Discussed which activities to do and which ones to avoid.  Follow-up again in 3 months to further check-in.    Known arthritic changes of the knees bilaterally. Wants to hold on any type of type of injections at the moment and seems to be doing okay. Discussed which activities to do and which ones to avoid. Increase activity slowly otherwise. Follow-up with me again in 6 to 8 weeks.   Updated 04/16/2024 Monique Rangel is a 69 y.o. female coming in with complaint of sciatic pain and swelling at base of skull.  Patient states was having headaches. The neck is painful, feels tight mostly on the R side. Wants to get MRI done. Sciatic pain is mostly R side. Was bad this past November. Every morning has some tingling that runs from lower back down to toes. We have not seen patient for greater than a year.  Had been refilling the gabapentin  that was helping her with some of the discomfort and pain.  Did see her primary care recently in December.  Found improvement with meloxicam .  Started physical therapy as well, primary care did feel the gabapentin .  Referred back here though for further evaluation.      Past Medical History:  Diagnosis Date   Allergy    Anxiety 05/11/2016   Arthritis    Asthma    Cataract     Depression    GERD (gastroesophageal reflux disease)    Myocardial infarction [facility])    Past Surgical History:  Procedure Laterality Date   EYE SURGERY  both cataracts   FINGER SURGERY Right    benign tumor on right middle finger   HERNIA REPAIR  1992   Social History   Socioeconomic History   Marital status: Married    Spouse name: Not on file   Number of children: 2   Years of education: Not on file   Highest education level: Master's degree (e.g., MA, MS, MEng, MEd, MSW, MBA)  Occupational History   Not on file  Tobacco Use   Smoking status: Never    Passive exposure: Past   Smokeless tobacco: Never  Vaping Use   Vaping status: Never Used  Substance and Sexual Activity   Alcohol use: Yes    Alcohol/week: 2.0 standard drinks of alcohol    Types: 2 Glasses of wine per week    Comment: usually with a meal   Drug use: Never   Sexual activity: Not Currently    Birth control/protection: Post-menopausal  Other Topics Concern   Not on file  Social History Narrative   Not on file   Social Drivers of Health   Tobacco Use: Low Risk (03/11/2024)   Patient History    Smoking Tobacco Use: Never    Smokeless Tobacco Use: Never  Passive Exposure: Past  Physicist, Medical Strain: Low Risk (02/07/2024)   Overall Financial Resource Strain (CARDIA)    Difficulty of Paying Living Expenses: Not hard at all  Food Insecurity: No Food Insecurity (02/07/2024)   Epic    Worried About Programme Researcher, Broadcasting/film/video in the Last Year: Never true    Ran Out of Food in the Last Year: Never true  Transportation Needs: No Transportation Needs (02/07/2024)   Epic    Lack of Transportation (Medical): No    Lack of Transportation (Non-Medical): No  Physical Activity: Insufficiently Active (02/07/2024)   Exercise Vital Sign    Days of Exercise per Week: 3 days    Minutes of Exercise per Session: 20 min  Stress: Stress Concern Present (02/07/2024)   [employer] of Occupational Health -  Occupational Stress Questionnaire    Feeling of Stress: To some extent  Social Connections: Socially Integrated (02/07/2024)   Social Connection and Isolation Panel    Frequency of Communication with Friends and Family: More than three times a week    Frequency of Social Gatherings with Friends and Family: Three times a week    Attends Religious Services: More than 4 times per year    Active Member of Clubs or Organizations: Yes    Attends Banker Meetings: More than 4 times per year    Marital Status: Married  Depression (PHQ2-9): Low Risk (03/08/2024)   Depression (PHQ2-9)    PHQ-2 Score: 0  Recent Concern: Depression (PHQ2-9) - Medium Risk (02/07/2024)   Depression (PHQ2-9)    PHQ-2 Score: 5  Alcohol Screen: Low Risk (02/07/2024)   Alcohol Screen    Last Alcohol Screening Score (AUDIT): 2  Housing: Low Risk (02/07/2024)   Epic    Unable to Pay for Housing in the Last Year: No    Number of Times Moved in the Last Year: 0    Homeless in the Last Year: No  Utilities: Not At Risk (04/03/2023)   AHC Utilities    Threatened with loss of utilities: No  Health Literacy: Adequate Health Literacy (04/03/2023)   B1300 Health Literacy    Frequency of need for help with medical instructions: Never   Allergies[1] Family History  Problem Relation Age of Onset   Hearing loss Mother    Miscarriages / Stillbirths Mother    Cancer Father    Diabetes Father    Stroke Father    Hearing loss Sister    Arthritis Maternal Aunt    Vision loss Maternal Aunt    Early death Maternal Grandfather    Early death Maternal Grandmother    Drug abuse Paternal Grandfather    Hearing loss Paternal Grandfather    Heart disease Paternal Grandfather    Cancer Paternal Grandmother    Early death Paternal Grandmother    Varicose Veins Paternal Grandmother    Depression Maternal Aunt    Breast cancer Neg Hx     Current Outpatient Medications (Respiratory):    albuterol  (VENTOLIN  HFA) 108  (90 Base) MCG/ACT inhaler, Inhale 1-2 puffs into the lungs every 6 (six) hours as needed for wheezing or shortness of breath.   albuterol  (VENTOLIN  HFA) 108 (90 Base) MCG/ACT inhaler, Inhale 2 puffs into the lungs every 6 (six) hours as needed for wheezing or shortness of breath.   benzonatate  (TESSALON ) 200 MG capsule, Take 1 capsule (200 mg total) by mouth 2 (two) times daily as needed for cough.   diphenhydrAMINE (BENADRYL) 25 MG tablet, Take 25 mg  by mouth at bedtime as needed.   fluticasone (FLONASE) 50 MCG/ACT nasal spray, Place 2 sprays into both nostrils every morning.  Current Outpatient Medications (Analgesics):    celecoxib  (CELEBREX ) 100 MG capsule, Take 1 capsule (100 mg total) by mouth 2 (two) times daily.   acetaminophen  (TYLENOL  8 HOUR) 650 MG CR tablet, Take 1 tablet (650 mg total) by mouth every 8 (eight) hours as needed for pain.   meloxicam  (MOBIC ) 7.5 MG tablet, Take 1 tablet (7.5 mg total) by mouth daily.  Current Outpatient Medications (Other):    Biotin (BIOTIN MAXIMUM STRENGTH) 10 MG TABS, Take by mouth.   Cholecalciferol (VITAMIN D3) 50 MCG (2000 UT) TABS, Take 2 tablets by mouth daily.   DULoxetine  (CYMBALTA ) 20 MG capsule, TAKE 2 CAPSULES BY MOUTH DAILY   gabapentin  (NEURONTIN ) 100 MG capsule, May take 1 to 2 tablets at night as needed for pain   Multiple Vitamin (MULTI-VITAMIN) tablet, Take by mouth.   Multiple Vitamins-Minerals (PRESERVISION AREDS PO), Take by mouth.   omeprazole  (PRILOSEC) 40 MG capsule, TAKE 1 CAPSULE BY MOUTH DAILY   tiZANidine  (ZANAFLEX ) 4 MG tablet, Take 1 tablet (4 mg total) by mouth every 8 (eight) hours as needed for muscle spasms.   valACYclovir  (VALTREX ) 1000 MG tablet, TAKE 1/2 TABLET BY MOUTH TWICE A DAY FOR 3 DAYS AS NEEDED   Reviewed prior external information including notes and imaging from  primary care provider As well as notes that were available from care everywhere and other healthcare systems.  Past medical history,  social, surgical and family history all reviewed in electronic medical record.  No pertanent information unless stated regarding to the chief complaint.   Review of Systems:  No headache, visual changes, nausea, vomiting, diarrhea, constipation, dizziness, abdominal pain, skin rash, fevers, chills, night sweats, weight loss, swollen lymph nodes, body aches, joint swelling, chest pain, shortness of breath, mood changes. POSITIVE muscle aches  Objective  Blood pressure 132/88, pulse 88, height 5' 6 (1.676 m), SpO2 95%.   General: No apparent distress alert and oriented x3 mood and affect normal, dressed appropriately.  HEENT: Pupils equal, extraocular movements intact  Respiratory: Patient's speak in full sentences and does not appear short of breath  Cardiovascular: No lower extremity edema, non tender, no erythema  Low back exam shows some loss of lordosis noted.  Patient does have tightness noted with FABER bilaterally. Neck exam shows does have some tightness noted in the neck.  Negative Spurling's noted.    Impression and Recommendations:     The above documentation has been reviewed and is accurate and complete Arthea CHRISTELLA Sharps, DO       [1]  Allergies Allergen Reactions   Keflex  [Cephalexin ] Other (See Comments)    Abdominal pain and Nausea   Lamisil  [Terbinafine ] Rash   Quinine Other (See Comments)    Mother had a bad reaction so does not want to use it Mother had a bad reaction so does not want to use it    "

## 2024-04-16 ENCOUNTER — Ambulatory Visit: Admitting: Family Medicine

## 2024-04-16 VITALS — BP 132/88 | HR 88 | Ht 66.0 in

## 2024-04-16 DIAGNOSIS — M5431 Sciatica, right side: Secondary | ICD-10-CM | POA: Diagnosis not present

## 2024-04-16 DIAGNOSIS — M542 Cervicalgia: Secondary | ICD-10-CM

## 2024-04-16 DIAGNOSIS — M5030 Other cervical disc degeneration, unspecified cervical region: Secondary | ICD-10-CM | POA: Diagnosis not present

## 2024-04-16 MED ORDER — CELECOXIB 100 MG PO CAPS: 100.0000 mg | ORAL_CAPSULE | Freq: Two times a day (BID) | ORAL | 0 refills | Status: AC

## 2024-04-16 NOTE — Assessment & Plan Note (Signed)
 Known degenerative disc disease, will get repeat x-rays to further evaluate.  Worsening pain and radicular symptoms advanced imaging would be warranted.

## 2024-04-16 NOTE — Assessment & Plan Note (Signed)
 Multifactorial but I believe the patient does have actually at this time some shotty lymph nodes noted on the right side of the neck and after a upper respiratory infection that is likely contributing to some of the discomfort.  Patient does have some mild scapular dyskinesis right greater than left so did discuss the home exercises that I think will be beneficial.  Discussed icing regimen, follow-up with me again in 6 to 8 weeks otherwise.

## 2024-04-16 NOTE — Patient Instructions (Addendum)
 PT [place] Discontinue Meloxicam  Start Celebrex  100mg  2x a day Continue other medicines Do prescribed exercises at least 3x a week Splint to wear at night See you again in 6- 8 weeks

## 2024-04-30 ENCOUNTER — Ambulatory Visit: Admitting: Family Medicine

## 2024-06-03 ENCOUNTER — Ambulatory Visit

## 2024-06-03 ENCOUNTER — Ambulatory Visit: Admitting: Family Medicine
